# Patient Record
Sex: Male | Born: 1950 | Race: White | Hispanic: No | Smoking: Current every day smoker
Health system: Southern US, Community
[De-identification: ages and names within clinical notes are randomized; demographics above are authoritative.]

## PROBLEM LIST (undated history)

## (undated) DIAGNOSIS — N189 Chronic kidney disease, unspecified: Secondary | ICD-10-CM

## (undated) DIAGNOSIS — E119 Type 2 diabetes mellitus without complications: Secondary | ICD-10-CM

## (undated) DIAGNOSIS — S060XAA Concussion with loss of consciousness status unknown, initial encounter: Secondary | ICD-10-CM

## (undated) DIAGNOSIS — J9621 Acute and chronic respiratory failure with hypoxia: Secondary | ICD-10-CM

## (undated) DIAGNOSIS — S060X9A Concussion with loss of consciousness of unspecified duration, initial encounter: Secondary | ICD-10-CM

## (undated) DIAGNOSIS — I2510 Atherosclerotic heart disease of native coronary artery without angina pectoris: Secondary | ICD-10-CM

## (undated) DIAGNOSIS — N183 Chronic kidney disease, stage 3 unspecified: Secondary | ICD-10-CM

## (undated) DIAGNOSIS — I739 Peripheral vascular disease, unspecified: Secondary | ICD-10-CM

## (undated) DIAGNOSIS — R4182 Altered mental status, unspecified: Secondary | ICD-10-CM

## (undated) DIAGNOSIS — I629 Nontraumatic intracranial hemorrhage, unspecified: Secondary | ICD-10-CM

## (undated) DIAGNOSIS — I639 Cerebral infarction, unspecified: Secondary | ICD-10-CM

## (undated) DIAGNOSIS — I10 Essential (primary) hypertension: Secondary | ICD-10-CM

## (undated) HISTORY — PX: NECK SURGERY: SHX720

## (undated) HISTORY — DX: Peripheral vascular disease, unspecified: I73.9

## (undated) HISTORY — PX: VEIN BYPASS SURGERY: SHX833

---

## 2002-06-24 ENCOUNTER — Ambulatory Visit (HOSPITAL_COMMUNITY): Admission: RE | Admit: 2002-06-24 | Discharge: 2002-06-24 | Payer: Self-pay | Admitting: *Deleted

## 2002-06-24 ENCOUNTER — Encounter: Payer: Self-pay | Admitting: *Deleted

## 2002-07-16 ENCOUNTER — Ambulatory Visit (HOSPITAL_COMMUNITY): Admission: RE | Admit: 2002-07-16 | Discharge: 2002-07-16 | Payer: Self-pay | Admitting: *Deleted

## 2002-08-13 ENCOUNTER — Ambulatory Visit (HOSPITAL_COMMUNITY): Admission: RE | Admit: 2002-08-13 | Discharge: 2002-08-14 | Payer: Self-pay | Admitting: *Deleted

## 2007-06-28 ENCOUNTER — Encounter (INDEPENDENT_AMBULATORY_CARE_PROVIDER_SITE_OTHER): Payer: Self-pay | Admitting: Family Medicine

## 2007-06-28 ENCOUNTER — Ambulatory Visit: Admission: RE | Admit: 2007-06-28 | Discharge: 2007-06-28 | Payer: Self-pay | Admitting: Family Medicine

## 2007-06-28 ENCOUNTER — Ambulatory Visit: Payer: Self-pay | Admitting: Vascular Surgery

## 2012-11-19 ENCOUNTER — Other Ambulatory Visit: Payer: Self-pay | Admitting: Family Medicine

## 2012-11-19 DIAGNOSIS — R221 Localized swelling, mass and lump, neck: Secondary | ICD-10-CM

## 2012-12-02 ENCOUNTER — Ambulatory Visit
Admission: RE | Admit: 2012-12-02 | Discharge: 2012-12-02 | Disposition: A | Discharge status: Home / Self Care | Payer: BC Managed Care – PPO | Source: Ambulatory Visit | Attending: Family Medicine | Admitting: Family Medicine

## 2012-12-02 DIAGNOSIS — R221 Localized swelling, mass and lump, neck: Secondary | ICD-10-CM

## 2012-12-02 MED ORDER — IOHEXOL 300 MG/ML  SOLN
75.0000 mL | Freq: Once | INTRAMUSCULAR | Status: AC | PRN
  Administered 2012-12-02: 75 mL via INTRAVENOUS

## 2013-01-17 ENCOUNTER — Ambulatory Visit (HOSPITAL_COMMUNITY): Admission: RE | Admit: 2013-01-17 | Payer: BC Managed Care – PPO | Source: Ambulatory Visit | Admitting: Otolaryngology

## 2013-01-17 ENCOUNTER — Encounter (HOSPITAL_COMMUNITY): Admission: RE | Payer: Self-pay | Source: Ambulatory Visit

## 2013-01-17 SURGERY — EXCISION, PAROTID GLAND
Anesthesia: General | Laterality: Right

## 2014-10-10 ENCOUNTER — Emergency Department (HOSPITAL_COMMUNITY)
Admission: EM | Admit: 2014-10-10 | Discharge: 2014-10-10 | Disposition: A | Discharge status: Home / Self Care | Payer: No Typology Code available for payment source | Attending: Emergency Medicine | Admitting: Emergency Medicine

## 2014-10-10 ENCOUNTER — Encounter (HOSPITAL_COMMUNITY): Payer: Self-pay | Admitting: *Deleted

## 2014-10-10 DIAGNOSIS — I10 Essential (primary) hypertension: Secondary | ICD-10-CM | POA: Insufficient documentation

## 2014-10-10 DIAGNOSIS — I2510 Atherosclerotic heart disease of native coronary artery without angina pectoris: Secondary | ICD-10-CM | POA: Diagnosis not present

## 2014-10-10 DIAGNOSIS — Z720 Tobacco use: Secondary | ICD-10-CM | POA: Diagnosis not present

## 2014-10-10 DIAGNOSIS — Z7982 Long term (current) use of aspirin: Secondary | ICD-10-CM | POA: Insufficient documentation

## 2014-10-10 DIAGNOSIS — Z79899 Other long term (current) drug therapy: Secondary | ICD-10-CM | POA: Diagnosis not present

## 2014-10-10 DIAGNOSIS — E119 Type 2 diabetes mellitus without complications: Secondary | ICD-10-CM | POA: Diagnosis not present

## 2014-10-10 HISTORY — DX: Type 2 diabetes mellitus without complications: E11.9

## 2014-10-10 HISTORY — DX: Atherosclerotic heart disease of native coronary artery without angina pectoris: I25.10

## 2014-10-10 HISTORY — DX: Essential (primary) hypertension: I10

## 2014-10-10 LAB — URINALYSIS, ROUTINE W REFLEX MICROSCOPIC
BILIRUBIN URINE: NEGATIVE
Glucose, UA: NEGATIVE mg/dL
KETONES UR: NEGATIVE mg/dL
LEUKOCYTES UA: NEGATIVE
NITRITE: NEGATIVE
Protein, ur: NEGATIVE mg/dL
Specific Gravity, Urine: 1.014 (ref 1.005–1.030)
UROBILINOGEN UA: 0.2 mg/dL (ref 0.0–1.0)
pH: 7 (ref 5.0–8.0)

## 2014-10-10 LAB — BASIC METABOLIC PANEL
ANION GAP: 10 (ref 5–15)
BUN: 12 mg/dL (ref 6–20)
CALCIUM: 9.4 mg/dL (ref 8.9–10.3)
CHLORIDE: 101 mmol/L (ref 101–111)
CO2: 27 mmol/L (ref 22–32)
Creatinine, Ser: 1.23 mg/dL (ref 0.61–1.24)
GFR, EST NON AFRICAN AMERICAN: 59 mL/min — AB (ref >60)
Glucose, Bld: 119 mg/dL — ABNORMAL HIGH (ref 65–99)
POTASSIUM: 4.2 mmol/L (ref 3.5–5.1)
SODIUM: 138 mmol/L (ref 135–145)

## 2014-10-10 LAB — URINE MICROSCOPIC-ADD ON

## 2014-10-10 LAB — I-STAT TROPONIN, ED: TROPONIN I, POC: 0.01 ng/mL (ref 0.00–0.08)

## 2014-10-10 LAB — CBC
HCT: 44.8 % (ref 39.0–52.0)
Hemoglobin: 15.4 g/dL (ref 13.0–17.0)
MCH: 29.8 pg (ref 26.0–34.0)
MCHC: 34.4 g/dL (ref 30.0–36.0)
MCV: 86.7 fL (ref 78.0–100.0)
PLATELETS: 316 10*3/uL (ref 150–400)
RBC: 5.17 MIL/uL (ref 4.22–5.81)
RDW: 15.3 % (ref 11.5–15.5)
WBC: 8 10*3/uL (ref 4.0–10.5)

## 2014-10-10 MED ORDER — CLONIDINE HCL 0.1 MG PO TABS
0.1000 mg | ORAL_TABLET | Freq: Once | ORAL | Status: AC
  Administered 2014-10-10: 0.1 mg via ORAL
  Filled 2014-10-10: qty 1

## 2014-10-10 MED ORDER — CLONIDINE HCL 0.1 MG PO TABS: 0.1000 mg | ORAL_TABLET | Freq: Two times a day (BID) | ORAL | Status: DC

## 2014-10-10 MED ORDER — CLONIDINE HCL 0.2 MG PO TABS
0.2000 mg | ORAL_TABLET | Freq: Once | ORAL | Status: AC
  Administered 2014-10-10: 0.2 mg via ORAL
  Filled 2014-10-10: qty 1

## 2014-10-10 NOTE — Discharge Instructions (Signed)
DASH Eating Plan DASH stands for "Dietary Approaches to Stop Hypertension." The DASH eating plan is a healthy eating plan that has been shown to reduce high blood pressure (hypertension). Additional health benefits may include reducing the risk of type 2 diabetes mellitus, heart disease, and stroke. The DASH eating plan may also help with weight loss. WHAT DO I NEED TO KNOW ABOUT THE DASH EATING PLAN? For the DASH eating plan, you will follow these general guidelines:  Choose foods with a percent daily value for sodium of less than 5% (as listed on the food label).  Use salt-free seasonings or herbs instead of table salt or sea salt.  Check with your health care provider or pharmacist before using salt substitutes.  Eat lower-sodium products, often labeled as "lower sodium" or "no salt added."  Eat fresh foods.  Eat more vegetables, fruits, and low-fat dairy products.  Choose whole grains. Look for the word "whole" as the first word in the ingredient list.  Choose fish and skinless chicken or turkey more often than red meat. Limit fish, poultry, and meat to 6 oz (170 g) each day.  Limit sweets, desserts, sugars, and sugary drinks.  Choose heart-healthy fats.  Limit cheese to 1 oz (28 g) per day.  Eat more home-cooked food and less restaurant, buffet, and fast food.  Limit fried foods.  Cook foods using methods other than frying.  Limit canned vegetables. If you do use them, rinse them well to decrease the sodium.  When eating at a restaurant, ask that your food be prepared with less salt, or no salt if possible. WHAT FOODS CAN I EAT? Seek help from a dietitian for individual calorie needs. Grains Whole grain or whole wheat bread. Brown rice. Whole grain or whole wheat pasta. Quinoa, bulgur, and whole grain cereals. Low-sodium cereals. Corn or whole wheat flour tortillas. Whole grain cornbread. Whole grain crackers. Low-sodium crackers. Vegetables Fresh or frozen vegetables  (raw, steamed, roasted, or grilled). Low-sodium or reduced-sodium tomato and vegetable juices. Low-sodium or reduced-sodium tomato sauce and paste. Low-sodium or reduced-sodium canned vegetables.  Fruits All fresh, canned (in natural juice), or frozen fruits. Meat and Other Protein Products Ground beef (85% or leaner), grass-fed beef, or beef trimmed of fat. Skinless chicken or turkey. Ground chicken or turkey. Pork trimmed of fat. All fish and seafood. Eggs. Dried beans, peas, or lentils. Unsalted nuts and seeds. Unsalted canned beans. Dairy Low-fat dairy products, such as skim or 1% milk, 2% or reduced-fat cheeses, low-fat ricotta or cottage cheese, or plain low-fat yogurt. Low-sodium or reduced-sodium cheeses. Fats and Oils Tub margarines without trans fats. Light or reduced-fat mayonnaise and salad dressings (reduced sodium). Avocado. Safflower, olive, or canola oils. Natural peanut or almond butter. Other Unsalted popcorn and pretzels. The items listed above may not be a complete list of recommended foods or beverages. Contact your dietitian for more options. WHAT FOODS ARE NOT RECOMMENDED? Grains White bread. White pasta. White rice. Refined cornbread. Bagels and croissants. Crackers that contain trans fat. Vegetables Creamed or fried vegetables. Vegetables in a cheese sauce. Regular canned vegetables. Regular canned tomato sauce and paste. Regular tomato and vegetable juices. Fruits Dried fruits. Canned fruit in light or heavy syrup. Fruit juice. Meat and Other Protein Products Fatty cuts of meat. Ribs, chicken wings, bacon, sausage, bologna, salami, chitterlings, fatback, hot dogs, bratwurst, and packaged luncheon meats. Salted nuts and seeds. Canned beans with salt. Dairy Whole or 2% milk, cream, half-and-half, and cream cheese. Whole-fat or sweetened yogurt. Full-fat   cheeses or blue cheese. Nondairy creamers and whipped toppings. Processed cheese, cheese spreads, or cheese  curds. Condiments Onion and garlic salt, seasoned salt, table salt, and sea salt. Canned and packaged gravies. Worcestershire sauce. Tartar sauce. Barbecue sauce. Teriyaki sauce. Soy sauce, including reduced sodium. Steak sauce. Fish sauce. Oyster sauce. Cocktail sauce. Horseradish. Ketchup and mustard. Meat flavorings and tenderizers. Bouillon cubes. Hot sauce. Tabasco sauce. Marinades. Taco seasonings. Relishes. Fats and Oils Butter, stick margarine, lard, shortening, ghee, and bacon fat. Coconut, palm kernel, or palm oils. Regular salad dressings. Other Pickles and olives. Salted popcorn and pretzels. The items listed above may not be a complete list of foods and beverages to avoid. Contact your dietitian for more information. WHERE CAN I FIND MORE INFORMATION? National Heart, Lung, and [employer] Institute: www.nhlbi.nih.gov/health/health-topics/topics/dash/ Document Released: 02/02/2011 Document Revised: 06/30/2013 Document Reviewed: 12/18/2012 ExitCare Patient Information 2015 [employer]. This information is not intended to replace advice given to you by your health care provider. Make sure you discuss any questions you have with your health care provider. Hypertension Hypertension, commonly called high blood pressure, is when the force of blood pumping through your arteries is too strong. Your arteries are the blood vessels that carry blood from your heart throughout your body. A blood pressure reading consists of a higher number over a lower number, such as 110/72. The higher number (systolic) is the pressure inside your arteries when your heart pumps. The lower number (diastolic) is the pressure inside your arteries when your heart relaxes. Ideally you want your blood pressure below 120/80. Hypertension forces your heart to work harder to pump blood. Your arteries may become narrow or stiff. Having hypertension puts you at risk for heart disease, stroke, and other problems.  RISK  FACTORS Some risk factors for high blood pressure are controllable. Others are not.  Risk factors you cannot control include:   Race. You may be at higher risk if you are African American.  Age. Risk increases with age.  Gender. Men are at higher risk than women before age 45 years. After age 65, women are at higher risk than men. Risk factors you can control include:  Not getting enough exercise or physical activity.  Being overweight.  Getting too much fat, sugar, calories, or salt in your diet.  Drinking too much alcohol. SIGNS AND SYMPTOMS Hypertension does not usually cause signs or symptoms. Extremely high blood pressure (hypertensive crisis) may cause headache, anxiety, shortness of breath, and nosebleed. DIAGNOSIS  To check if you have hypertension, your health care provider will measure your blood pressure while you are seated, with your arm held at the level of your heart. It should be measured at least twice using the same arm. Certain conditions can cause a difference in blood pressure between your right and left arms. A blood pressure reading that is higher than normal on one occasion does not mean that you need treatment. If one blood pressure reading is high, ask your health care provider about having it checked again. TREATMENT  Treating high blood pressure includes making lifestyle changes and possibly taking medicine. Living a healthy lifestyle can help lower high blood pressure. You may need to change some of your habits. Lifestyle changes may include:  Following the DASH diet. This diet is high in fruits, vegetables, and whole grains. It is low in salt, red meat, and added sugars.  Getting at least 2 hours of brisk physical activity every week.  Losing weight if necessary.  Not smoking.  Limiting   alcoholic beverages.  Learning ways to reduce stress. If lifestyle changes are not enough to get your blood pressure under control, your health care provider may  prescribe medicine. You may need to take more than one. Work closely with your health care provider to understand the risks and benefits. HOME CARE INSTRUCTIONS  Have your blood pressure rechecked as directed by your health care provider.   Take medicines only as directed by your health care provider. Follow the directions carefully. Blood pressure medicines must be taken as prescribed. The medicine does not work as well when you skip doses. Skipping doses also puts you at risk for problems.   Do not smoke.   Monitor your blood pressure at home as directed by your health care provider. SEEK MEDICAL CARE IF:   You think you are having a reaction to medicines taken.  You have recurrent headaches or feel dizzy.  You have swelling in your ankles.  You have trouble with your vision. SEEK IMMEDIATE MEDICAL CARE IF:  You develop a severe headache or confusion.  You have unusual weakness, numbness, or feel faint.  You have severe chest or abdominal pain.  You vomit repeatedly.  You have trouble breathing. MAKE SURE YOU:   Understand these instructions.  Will watch your condition.  Will get help right away if you are not doing well or get worse. Document Released: 02/13/2005 Document Revised: 06/30/2013 Document Reviewed: 12/06/2012 ExitCare Patient Information 2015 [employer]. This information is not intended to replace advice given to you by your health care provider. Make sure you discuss any questions you have with your health care provider.  

## 2014-10-10 NOTE — ED Notes (Signed)
The pt is c/o being dizzy and his bp is high.  He is supposed to take bp meds but he only takes that  Sometimes.  He last took bp med  Monday and Tuesday.  C/o some neck pain

## 2014-10-10 NOTE — ED Provider Notes (Signed)
CSN: [id number]     Arrival date & time 10/10/14  1734 History   First MD Initiated Contact with Patient 10/10/14 1847     Chief Complaint  Patient presents with  . Hypertension      HPI The pt is c/o being dizzy and his bp is high. He is supposed to take bp meds but he only takes that Sometimes. He last took bp med Monday and Tuesday. C/o some neck pain Past Medical History  Diagnosis Date  . Coronary artery disease   . Diabetes mellitus without complication   . Hypertension    History reviewed. No pertinent past surgical history. No family history on file. Social History  Substance Use Topics  . Smoking status: Current Every Day Smoker  . Smokeless tobacco: None  . Alcohol Use: No    Review of Systems  All other systems reviewed and are negative  Allergies  Review of patient's allergies indicates no known allergies.  Home Medications   Prior to Admission medications   Medication Sig Start Date End Date Taking? Authorizing Provider  aspirin EC 81 MG tablet Take 81 mg by mouth daily.   Yes Historical Provider, MD  cyclobenzaprine (FLEXERIL) 10 MG tablet Take 10 mg by mouth 3 (three) times daily as needed for muscle spasms.   Yes Historical Provider, MD  ibuprofen (ADVIL,MOTRIN) 200 MG tablet Take 400 mg by mouth every 6 (six) hours as needed for moderate pain.    Yes Historical Provider, MD  valsartan (DIOVAN) 320 MG tablet Take 320 mg by mouth daily.   Yes Historical Provider, MD  cloNIDine (CATAPRES) 0.1 MG tablet Take 1 tablet (0.1 mg total) by mouth 2 (two) times daily. 10/10/14   Nelva Nay, MD   BP 249/110 mmHg  Pulse 54  Temp(Src) 97.5 F (36.4 C) (Oral)  Resp 17  Wt 141 lb (63.957 kg)  SpO2 98% Physical Exam Physical Exam  Nursing note and vitals reviewed. Constitutional: He is oriented to person, place, and time. He appears well-developed and well-nourished. No distress.  HENT:  Head: Normocephalic and atraumatic.  Eyes: Pupils are equal, round,  and reactive to light.  Neck: Normal range of motion.  Cardiovascular: Normal rate and intact distal pulses.   Pulmonary/Chest: No respiratory distress.  Abdominal: Normal appearance. He exhibits no distension.  Musculoskeletal: Normal range of motion.  Neurological: He is alert and oriented to person, place, and time. No cranial nerve deficit.  Skin: Skin is warm and dry. No rash noted.  Psychiatric: He has a normal mood and affect. His behavior is normal.   ED Course  Procedures (including critical care time) Medications  cloNIDine (CATAPRES) tablet 0.2 mg (0.2 mg Oral Given 10/10/14 1938)  cloNIDine (CATAPRES) tablet 0.1 mg (0.1 mg Oral Given 10/10/14 2046)    Labs Review Labs Reviewed  BASIC METABOLIC PANEL - Abnormal; Notable for the following:    Glucose, Bld 119 (*)    GFR calc non Af Amer 59 (*)    All other components within normal limits  URINALYSIS, ROUTINE W REFLEX MICROSCOPIC (NOT AT [facility]) - Abnormal; Notable for the following:    APPearance CLOUDY (*)    Hgb urine dipstick TRACE (*)    All other components within normal limits  CBC  URINE MICROSCOPIC-ADD ON  CBG MONITORING, ED  I-STAT TROPOININ, ED    Imaging Review No results found. I, Rielynn Trulson L, personally reviewed and evaluated these images and lab results as part of my medical decision-making.  EKG Interpretation   Date/Time:  Saturday October 10 2014 18:08:38 EDT Ventricular Rate:  53 PR Interval:  126 QRS Duration: 92 QT Interval:  472 QTC Calculation: 442 R Axis:   81 Text Interpretation:  Sinus bradycardia Biatrial enlargement ST \\T \ T wave  abnormality, consider inferior ischemia  vs LVH with strain Abnormal ECG  Confirmed by Fayrene Fearing  MD, MARK ([id number]) on 10/10/2014 6:11:16 PM     MAP at discharge was 106 After treatment in the ED the patient feels back to baseline and wants to go home. MDM   Final diagnoses:  Essential hypertension        Nelva Nay, MD 10/10/14 2141

## 2017-03-20 DIAGNOSIS — R7303 Prediabetes: Secondary | ICD-10-CM | POA: Diagnosis not present

## 2017-03-20 DIAGNOSIS — I714 Abdominal aortic aneurysm, without rupture: Secondary | ICD-10-CM | POA: Diagnosis not present

## 2017-03-20 DIAGNOSIS — N183 Chronic kidney disease, stage 3 (moderate): Secondary | ICD-10-CM | POA: Diagnosis not present

## 2017-03-20 DIAGNOSIS — I129 Hypertensive chronic kidney disease with stage 1 through stage 4 chronic kidney disease, or unspecified chronic kidney disease: Secondary | ICD-10-CM | POA: Diagnosis not present

## 2017-03-20 DIAGNOSIS — E785 Hyperlipidemia, unspecified: Secondary | ICD-10-CM | POA: Diagnosis not present

## 2017-03-20 DIAGNOSIS — R634 Abnormal weight loss: Secondary | ICD-10-CM | POA: Diagnosis not present

## 2017-03-20 DIAGNOSIS — I739 Peripheral vascular disease, unspecified: Secondary | ICD-10-CM | POA: Diagnosis not present

## 2017-03-20 DIAGNOSIS — R69 Illness, unspecified: Secondary | ICD-10-CM | POA: Diagnosis not present

## 2017-10-03 DIAGNOSIS — R7303 Prediabetes: Secondary | ICD-10-CM | POA: Diagnosis not present

## 2017-10-03 DIAGNOSIS — N183 Chronic kidney disease, stage 3 (moderate): Secondary | ICD-10-CM | POA: Diagnosis not present

## 2017-10-03 DIAGNOSIS — I714 Abdominal aortic aneurysm, without rupture: Secondary | ICD-10-CM | POA: Diagnosis not present

## 2017-10-03 DIAGNOSIS — R69 Illness, unspecified: Secondary | ICD-10-CM | POA: Diagnosis not present

## 2017-10-03 DIAGNOSIS — I129 Hypertensive chronic kidney disease with stage 1 through stage 4 chronic kidney disease, or unspecified chronic kidney disease: Secondary | ICD-10-CM | POA: Diagnosis not present

## 2017-10-03 DIAGNOSIS — I739 Peripheral vascular disease, unspecified: Secondary | ICD-10-CM | POA: Diagnosis not present

## 2017-10-03 DIAGNOSIS — Z125 Encounter for screening for malignant neoplasm of prostate: Secondary | ICD-10-CM | POA: Diagnosis not present

## 2017-10-09 DIAGNOSIS — I714 Abdominal aortic aneurysm, without rupture: Secondary | ICD-10-CM | POA: Diagnosis not present

## 2017-11-08 DIAGNOSIS — R69 Illness, unspecified: Secondary | ICD-10-CM | POA: Diagnosis not present

## 2017-11-15 ENCOUNTER — Other Ambulatory Visit: Payer: Self-pay

## 2017-11-15 DIAGNOSIS — I714 Abdominal aortic aneurysm, without rupture, unspecified: Secondary | ICD-10-CM

## 2017-11-26 ENCOUNTER — Other Ambulatory Visit: Payer: Self-pay

## 2017-11-26 ENCOUNTER — Encounter: Payer: Self-pay | Admitting: Surgery

## 2017-11-26 ENCOUNTER — Ambulatory Visit (INDEPENDENT_AMBULATORY_CARE_PROVIDER_SITE_OTHER): Payer: Medicare HMO | Admitting: Surgery

## 2017-11-26 VITALS — BP 139/74 | HR 68 | Temp 97.7°F | Resp 18 | Ht 67.5 in | Wt 131.0 lb

## 2017-11-26 DIAGNOSIS — I713 Abdominal aortic aneurysm, ruptured, unspecified: Secondary | ICD-10-CM

## 2017-11-26 DIAGNOSIS — I739 Peripheral vascular disease, unspecified: Secondary | ICD-10-CM

## 2017-11-26 DIAGNOSIS — I714 Abdominal aortic aneurysm, without rupture, unspecified: Secondary | ICD-10-CM

## 2017-11-26 DIAGNOSIS — I6529 Occlusion and stenosis of unspecified carotid artery: Secondary | ICD-10-CM

## 2017-11-26 NOTE — Progress Notes (Signed)
[facility]  Patient name: Mark [name] MRN: [id number] DOB: [birth year] Sex: male   REQUESTING PROVIDER:    Dr. Cliffton Asters   REASON FOR CONSULT:    AAA   HISTORY OF PRESENT ILLNESS:   Mark [name] is a 67 y.o. male, who is referred for evaluation of a abdominal aortic aneurysm.  He most recently had an ultrasound that revealed a 5.3 cm abdominal aneurysm.  The patient denies any abdominal pain or back pain  Patient has a history of peripheral vascular disease.  He states that he had 5 stents placed many many years ago.  He is medically managed for hypertension.  He has hypercholesterolemia but is intolerant of statins.  He is a current smoker, approximately 2 packs/day.  He states that he can walk up about 5 flights of steps before he gets short of breath.  He does walk approximately 1/4-1/3 of a mile without difficulty.  He does have a history of nonischemic cardiomyopathy, followed by Dr. Leeann Must.  He has a history of stroke in 2016.  His carotid arteries were normal at that time.  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  PAST MEDICAL HISTORY    Past Medical History:  Diagnosis Date  . Coronary artery disease   . Diabetes mellitus without complication (HCC)   . Hypertension   . PVD (peripheral vascular disease) (HCC)      FAMILY HISTORY   Family History  Problem Relation Age of Onset  . Dementia Mother   . Diabetes Mother   . Heart disease Mother   . Diabetes Father   . Heart disease Father     SOCIAL HISTORY:   Social History   Socioeconomic History  . Marital status: Married    Spouse name: Not on file  . Number of children: Not on file  . Years of education: Not on file  . Highest education level: Not on file  Occupational History  . Not on file  Social Needs  . Financial resource strain: Not on file  . Food insecurity:    Worry: Not on file    Inability: Not  on file  . Transportation needs:    Medical: Not on file    Non-medical: Not on file  Tobacco Use  . Smoking status: Current Every Day Smoker  . Smokeless tobacco: Never Used  Substance and Sexual Activity  . Alcohol use: No  . Drug use: Not on file  . Sexual activity: Not on file  Lifestyle  . Physical activity:    Days per week: Not on file    Minutes per session: Not on file  . Stress: Not on file  Relationships  . Social connections:    Talks on phone: Not on file    Gets together: Not on file    Attends religious service: Not on file    Active member of club or organization: Not on file    Attends meetings of clubs or organizations: Not on file    Relationship status: Not on file  . Intimate partner violence:    Fear of current or ex partner: Not on file    Emotionally abused: Not on file    Physically abused: Not on file    Forced sexual activity: Not on file  Other Topics Concern  . Not on file  Social History Narrative  . Not on file    ALLERGIES:    Allergies  Allergen Reactions  .  Pravastatin Other (See Comments)    Pain & weakness  . Orajel Mouth-Aid [Carbamide Peroxide]   . Rosuvastatin   . Sudafed [Pseudoephedrine]     CURRENT MEDICATIONS:    Current Outpatient Medications  Medication Sig Dispense Refill  . amLODipine (NORVASC) 10 MG tablet Take 10 mg by mouth daily.    Marland Kitchen aspirin 81 MG chewable tablet Chew by mouth.    Marland Kitchen aspirin EC 81 MG tablet Take 81 mg by mouth daily.    . hydrALAZINE (APRESOLINE) 25 MG tablet Take 25 mg by mouth 3 (three) times daily.    Marland Kitchen lisinopril (PRINIVIL,ZESTRIL) 40 MG tablet Take 40 mg by mouth daily.    . cloNIDine (CATAPRES) 0.1 MG tablet Take 1 tablet (0.1 mg total) by mouth 2 (two) times daily. 60 tablet 11  . cyclobenzaprine (FLEXERIL) 10 MG tablet Take 10 mg by mouth 3 (three) times daily as needed for muscle spasms.    Marland Kitchen ibuprofen (ADVIL,MOTRIN) 200 MG tablet Take 400 mg by mouth every 6 (six) hours as needed  for moderate pain.     . valsartan (DIOVAN) 320 MG tablet Take 320 mg by mouth daily.     No current facility-administered medications for this visit.     REVIEW OF SYSTEMS:   [X]  denotes positive finding, [ ]  denotes negative finding Cardiac  Comments:  Chest pain or chest pressure:    Shortness of breath upon exertion:    Short of breath when lying flat:    Irregular heart rhythm:        Vascular    Pain in calf, thigh, or hip brought on by ambulation: x   Pain in feet at night that wakes you up from your sleep:  x   Blood clot in your veins:    Leg swelling:         Pulmonary    Oxygen at home:    Productive cough:     Wheezing:         Neurologic    Sudden weakness in arms or legs:     Sudden numbness in arms or legs:     Sudden onset of difficulty speaking or slurred speech:    Temporary loss of vision in one eye:     Problems with dizziness:         Gastrointestinal    Blood in stool:      Vomited blood:         Genitourinary    Burning when urinating:     Blood in urine:        Psychiatric    Major depression:         Hematologic    Bleeding problems:    Problems with blood clotting too easily:        Skin    Rashes or ulcers:        Constitutional    Fever or chills:     PHYSICAL EXAM:   Vitals:   11/26/17 0905  BP: 139/74  Pulse: 68  Resp: 18  Temp: 97.7 F (36.5 C)  TempSrc: Oral  SpO2: 100%  Weight: 131 lb (59.4 kg)  Height: 5' 7.5" (1.715 m)    GENERAL: The patient is a well-nourished male, in no acute distress. The vital signs are documented above. CARDIAC: There is a regular rate and rhythm.  VASCULAR: Nonpalpable pedal pulses.  Palpable femoral pulses.  No carotid bruits. PULMONARY: Nonlabored respirations ABDOMEN: Soft and non-tender with normal pitched bowel sounds.  Pulsatile abdominal mass which is not tender. MUSCULOSKELETAL: There are no major deformities or cyanosis. NEUROLOGIC: No focal weakness or paresthesias are  detected. SKIN: There are no ulcers or rashes noted. PSYCHIATRIC: The patient has a normal affect.  STUDIES:   Abdominal ultrasound: I have reviewed this which shows a 5.3 cm infrarenal abdominal aortic aneurysm.  ASSESSMENT and PLAN   AAA: I discussed the natural history of aneurysmal degeneration.  By ultrasound, he would meet size criteria to proceed with repair.  I told him that I need to get a CT angiogram to define his anatomy and confirm the diameter.  I will scan him from the chest abdomen and pelvis with fine cuts in case he needs to be considered for fenestrated repair.  With his history of peripheral vascular disease and stenting in the past, I will continue his CT angiogram with runoff.  He will also get preoperative carotid Doppler studies and ABIs.  He will follow-up with me in approximately 2 weeks once these studies have been completed.   Durene Cal, MD [facility] [phone] Pager [phone]

## 2017-12-12 DIAGNOSIS — I714 Abdominal aortic aneurysm, without rupture: Secondary | ICD-10-CM | POA: Diagnosis not present

## 2017-12-12 DIAGNOSIS — I10 Essential (primary) hypertension: Secondary | ICD-10-CM | POA: Diagnosis not present

## 2017-12-12 DIAGNOSIS — I739 Peripheral vascular disease, unspecified: Secondary | ICD-10-CM | POA: Diagnosis not present

## 2017-12-13 ENCOUNTER — Inpatient Hospital Stay: Admission: RE | Admit: 2017-12-13 | Payer: Medicare HMO | Source: Ambulatory Visit

## 2017-12-13 ENCOUNTER — Other Ambulatory Visit: Payer: Medicare HMO

## 2017-12-17 ENCOUNTER — Ambulatory Visit (HOSPITAL_COMMUNITY)
Admission: RE | Admit: 2017-12-17 | Discharge: 2017-12-17 | Disposition: A | Discharge status: Home / Self Care | Payer: Medicare HMO | Source: Ambulatory Visit | Attending: Surgery | Admitting: Surgery

## 2017-12-17 ENCOUNTER — Ambulatory Visit (INDEPENDENT_AMBULATORY_CARE_PROVIDER_SITE_OTHER)
Admission: RE | Admit: 2017-12-17 | Discharge: 2017-12-17 | Disposition: A | Discharge status: Home / Self Care | Payer: Medicare HMO | Source: Ambulatory Visit | Attending: Surgery | Admitting: Surgery

## 2017-12-17 DIAGNOSIS — I739 Peripheral vascular disease, unspecified: Secondary | ICD-10-CM | POA: Insufficient documentation

## 2017-12-17 DIAGNOSIS — I6529 Occlusion and stenosis of unspecified carotid artery: Secondary | ICD-10-CM | POA: Diagnosis present

## 2017-12-18 ENCOUNTER — Other Ambulatory Visit: Payer: Self-pay

## 2017-12-18 DIAGNOSIS — I714 Abdominal aortic aneurysm, without rupture, unspecified: Secondary | ICD-10-CM

## 2017-12-18 DIAGNOSIS — I739 Peripheral vascular disease, unspecified: Secondary | ICD-10-CM

## 2017-12-20 ENCOUNTER — Ambulatory Visit (HOSPITAL_COMMUNITY)
Admission: RE | Admit: 2017-12-20 | Discharge: 2017-12-20 | Disposition: A | Discharge status: Home / Self Care | Payer: Medicare HMO | Source: Ambulatory Visit | Attending: Surgery | Admitting: Surgery

## 2017-12-20 ENCOUNTER — Ambulatory Visit
Admission: RE | Admit: 2017-12-20 | Discharge: 2017-12-20 | Disposition: A | Discharge status: Home / Self Care | Payer: Medicare HMO | Source: Ambulatory Visit | Attending: Surgery | Admitting: Surgery

## 2017-12-20 ENCOUNTER — Other Ambulatory Visit: Payer: Medicare HMO

## 2017-12-20 DIAGNOSIS — I714 Abdominal aortic aneurysm, without rupture, unspecified: Secondary | ICD-10-CM

## 2017-12-20 DIAGNOSIS — I713 Abdominal aortic aneurysm, ruptured, unspecified: Secondary | ICD-10-CM

## 2017-12-20 DIAGNOSIS — I739 Peripheral vascular disease, unspecified: Secondary | ICD-10-CM | POA: Insufficient documentation

## 2017-12-20 MED ORDER — IOPAMIDOL (ISOVUE-370) INJECTION 76%
125.0000 mL | Freq: Once | INTRAVENOUS | Status: AC | PRN
  Administered 2017-12-20: 125 mL via INTRAVENOUS

## 2017-12-24 ENCOUNTER — Encounter: Payer: Self-pay | Admitting: *Deleted

## 2017-12-24 ENCOUNTER — Other Ambulatory Visit: Payer: Self-pay

## 2017-12-24 ENCOUNTER — Ambulatory Visit: Payer: Medicare HMO | Admitting: Surgery

## 2017-12-24 ENCOUNTER — Other Ambulatory Visit: Payer: Self-pay | Admitting: *Deleted

## 2017-12-24 ENCOUNTER — Encounter: Payer: Self-pay | Admitting: Surgery

## 2017-12-24 VITALS — BP 157/77 | HR 54 | Resp 18 | Ht 67.5 in | Wt 135.1 lb

## 2017-12-24 DIAGNOSIS — I70213 Atherosclerosis of native arteries of extremities with intermittent claudication, bilateral legs: Secondary | ICD-10-CM | POA: Diagnosis not present

## 2017-12-24 DIAGNOSIS — I714 Abdominal aortic aneurysm, without rupture, unspecified: Secondary | ICD-10-CM

## 2017-12-24 NOTE — Progress Notes (Signed)
[facility]  Patient name: Mark [name] MRN: [id number] DOB: 09-10-1950 Sex: male   REASON FOR VISIT:    Follow up  HISOTRY OF PRESENT ILLNESS:    Cherie Khera IV is a 67 y.o. male who returns with a CTA for follow up of his AAA.  Patient has a history of peripheral vascular disease.  He states that he had 5 stents placed many many years ago.  He is medically managed for hypertension.  He has hypercholesterolemia but is intolerant of statins.  He is a current smoker, approximately 2 packs/day.  He states that he can walk up about 5 flights of steps before he gets short of breath.  He does walk approximately 1/4-1/3 of a mile without difficulty.  He does have a history of nonischemic cardiomyopathy, followed by Dr. Leeann Must.  He has a history of stroke in 2016.  His carotid arteries were normal at that time.  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   PAST MEDICAL HISTORY:   Past Medical History:  Diagnosis Date  . Coronary artery disease   . Diabetes mellitus without complication (HCC)   . Hypertension   . PVD (peripheral vascular disease) (HCC)      FAMILY HISTORY:   Family History  Problem Relation Age of Onset  . Dementia Mother   . Diabetes Mother   . Heart disease Mother   . Diabetes Father   . Heart disease Father     SOCIAL HISTORY:   Social History   Tobacco Use  . Smoking status: Current Every Day Smoker  . Smokeless tobacco: Never Used  Substance Use Topics  . Alcohol use: No     ALLERGIES:   Allergies  Allergen Reactions  . Pravastatin Other (See Comments)    Pain & weakness  . Orajel Mouth-Aid [Carbamide Peroxide]   . Rosuvastatin   . Sudafed [Pseudoephedrine]      CURRENT MEDICATIONS:   Current Outpatient Medications  Medication Sig Dispense Refill  . amLODipine (NORVASC) 10 MG tablet Take 10 mg by mouth daily.    Marland Kitchen aspirin 81 MG  chewable tablet Chew by mouth.    Marland Kitchen aspirin EC 81 MG tablet Take 81 mg by mouth daily.    . cloNIDine (CATAPRES) 0.1 MG tablet Take 1 tablet (0.1 mg total) by mouth 2 (two) times daily. 60 tablet 11  . cyclobenzaprine (FLEXERIL) 10 MG tablet Take 10 mg by mouth 3 (three) times daily as needed for muscle spasms.    . hydrALAZINE (APRESOLINE) 25 MG tablet Take 25 mg by mouth 3 (three) times daily.    Marland Kitchen ibuprofen (ADVIL,MOTRIN) 200 MG tablet Take 400 mg by mouth every 6 (six) hours as needed for moderate pain.     Marland Kitchen lisinopril (PRINIVIL,ZESTRIL) 40 MG tablet Take 40 mg by mouth daily.    . valsartan (DIOVAN) 320 MG tablet Take 320 mg by mouth daily.     No current facility-administered medications for this visit.     REVIEW OF SYSTEMS:   [X]  denotes positive finding, [ ]  denotes negative finding Cardiac  Comments:  Chest pain or chest pressure:    Shortness of breath upon exertion:    Short of breath when lying flat:    Irregular heart rhythm:        Vascular    Pain in calf, thigh, or hip brought on by ambulation: x   Pain in feet at night that wakes  you up from your sleep:  x   Blood clot in your veins:    Leg swelling:         Pulmonary    Oxygen at home:    Productive cough:     Wheezing:         Neurologic    Sudden weakness in arms or legs:     Sudden numbness in arms or legs:     Sudden onset of difficulty speaking or slurred speech:    Temporary loss of vision in one eye:     Problems with dizziness:         Gastrointestinal    Blood in stool:     Vomited blood:         Genitourinary    Burning when urinating:     Blood in urine:        Psychiatric    Major depression:         Hematologic    Bleeding problems:    Problems with blood clotting too easily:        Skin    Rashes or ulcers:        Constitutional    Fever or chills:      PHYSICAL EXAM:   There were no vitals filed for this visit.  GENERAL: The patient is a well-nourished male, in no  acute distress. The vital signs are documented above. CARDIAC: There is a regular rate and rhythm.  PULMONARY: Non-labored respirations ABDOMEN: Soft and non-tender with normal pitched bowel sounds.  MUSCULOSKELETAL: There are no major deformities or cyanosis. NEUROLOGIC: No focal weakness or paresthesias are detected. SKIN: There are no ulcers or rashes noted. PSYCHIATRIC: The patient has a normal affect.  STUDIES:   I have ordered and reviewed the following studies:  Lower extremity duplex: Right: 50-74% stenosis noted in the mid SFA. Note: Native artery stenosis criteria was used. Unable to identify stents.  Left: 50-74% stenosis noted in the mid-dist SFA. Note: Native artery stenosis criteria was used. Unable to identify stents.  ABI: ABI/TBIToday's ABIToday's TBIPrevious ABIPrevious TBI +-------+-----------+-----------+------------+------------+ Right 0.92    0.62                 +-------+-----------+-----------+------------+------------+ Left  0.88    0.49                 +-------+-----------+-----------+------------+------------+  Carotid: Right Carotid: Velocities in the right ICA are consistent with a 1-39% stenosis.  Left Carotid: Velocities in the left ICA are consistent with a 1-39% stenosis.  Vertebrals: Bilateral vertebral arteries demonstrate antegrade flow. Subclavians: Normal flow hemodynamics were seen in bilateral subclavian       arteries.  CTA Chest: :1. Aortic atherosclerosis without evidence of aneurysmal disease of the thoracic aorta. 2. Coronary atherosclerosis with mild amount of calcified plaque at the level of the distal left main and LAD. 3. Advanced diffuse centrilobular emphysema without evidence of pulmonary nodule or acute process  CTA A/P: 1. Infrarenal abdominal aortic aneurysm with greatest transverse dimensions of 4.8 x 5.5 cm. There is an infrarenal neck  present measuring 2.1 x 2.2 cm. 2. Both kidneys are supplied by paired renal arteries emanating in close proximity off of the abdominal aorta. Small accessory lower pole renal arteries emanate immediately inferior to the dominant bilateral renal arteries. 3. 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 4. Right lower extremity demonstrates mild aneurysmal disease of the proximal and distal right common iliac artery and significant focal stenosis of the mid right common  iliac artery of approximately 75-80% narrowing. Stented right external iliac artery shows no significant recurrent stenosis. There is a 50-60% stenosis of the right SFA at the juncture of middle and distal thirds. Continuous peroneal and posterior tibial runoff on the right with reconstituted distal anterior tibial artery. 5. Left lower extremity demonstrates stented left external iliac artery with no significant recurrent stenosis. There is a focal 60-75% stenosis of the distal left SFA at the adductor hiatus. 40-50% tibioperoneal trunk stenosis. Three-vessel runoff in the left lower leg.    MEDICAL ISSUES:   AAA: After reviewing the CT scan, I feel the patient is a candidate for endovascular repair.  He does have a stenosis within the right common iliac artery that will be able to be addressed with stent graft repair.  I discussed the details of the procedure including the risks and benefits which include but are not limited to bleeding, renal insufficiency, intestinal ischemia, lower extremity ischemia.  All of his questions have been answered.  His operation will be scheduled within the next few weeks  Claudication: The patient CT scan shows bilateral superficial femoral artery stenosis as well as a right common iliac lesion.  I would like to see how his symptoms improve after endovascular repair which will address his iliac lesion.  Because he is still smoking and is tolerating his symptoms, I would like to defer any intervention on the  superficial femoral arteries as long as his symptoms are tolerable.  Smoking: We discussed the importance of smoking cessation moving forward.  I pointed out that he does have emphysema on his CT scan.  Hopefully this will be a opportunity for him to stop.  He appeared to be somewhat open to stopping.    Durene Cal, MD [facility] [phone] Pager [phone]

## 2018-01-08 NOTE — Pre-Procedure Instructions (Signed)
Mark [name]  01/08/2018      [id number] [place], [place] - [phone] [place] [address] Phone: [phone] Fax: [phone]    Your procedure is scheduled on Nov. 22  Report to [facility] Admitting at 5:30 A.M.  Call this number if you have problems the morning of surgery:  [phone]   Remember:  Do not eat or drink after midnight.      Take these medicines the morning of surgery with A SIP OF WATER :              Amlodipine (norvasc)             Cyclobenzaprine (flexeril) if needed            Hydralazine (apresoline)                         7 days prior to surgery STOP taking Aleve, Naproxen, Ibuprofen, Motrin, Advil, Goody's, BC's, all herbal medications, fish oil, and all vitamins                 How to Manage Your Diabetes Before and After Surgery  Why is it important to control my blood sugar before and after surgery? . Improving blood sugar levels before and after surgery helps healing and can limit problems. . A way of improving blood sugar control is eating a healthy diet by: o  Eating less sugar and carbohydrates o  Increasing activity/exercise o  Talking with your doctor about reaching your blood sugar goals . High blood sugars (greater than 180 mg/dL) can raise your risk of infections and slow your recovery, so you will need to focus on controlling your diabetes during the weeks before surgery. . Make sure that the doctor who takes care of your diabetes knows about your planned surgery including the date and location.  How do I manage my blood sugar before surgery? . Check your blood sugar at least 4 times a day, starting 2 days before surgery, to make sure that the level is not too high or low. o Check your blood sugar the morning of your surgery when you wake up and every 2 hours until you get to the Short Stay unit. . If your blood sugar is less than 70 mg/dL, you will need to treat for low blood  sugar: o Do not take insulin. o Treat a low blood sugar (less than 70 mg/dL) with  cup of clear juice (cranberry or apple), 4 glucose tablets, OR glucose gel. Recheck blood sugar in 15 minutes after treatment (to make sure it is greater than 70 mg/dL). If your blood sugar is not greater than 70 mg/dL on recheck, call [phone] o  for further instructions. . Report your blood sugar to the short stay nurse when you get to Short Stay.  . If you are admitted to the hospital after surgery: o Your blood sugar will be checked by the staff and you will probably be given insulin after surgery (instead of oral diabetes medicines) to make sure you have good blood sugar levels. o The goal for blood sugar control after surgery is 80-180 mg/dL.      Do not wear jewelry.  Do not wear lotions, powders, or perfumes, or deodorant.  Do not shave 48 hours prior to surgery.  Men may shave face and neck.  Do not bring valuables to the hospital.  [facility] is  not responsible for any belongings or valuables.  Contacts, dentures or bridgework may not be worn into surgery.  Leave your suitcase in the car.  After surgery it may be brought to your room.  For patients admitted to the hospital, discharge time will be determined by your treatment team.  Patients discharged the day of surgery will not be allowed to drive home.    Special instructions:  [facility]- Preparing For Surgery  Before surgery, you can play an important role. Because skin is not sterile, your skin needs to be as free of germs as possible. You can reduce the number of germs on your skin by washing with CHG (chlorahexidine gluconate) Soap before surgery.  CHG is an antiseptic cleaner which kills germs and bonds with the skin to continue killing germs even after washing.    Oral Hygiene is also important to reduce your risk of infection.  Remember - BRUSH YOUR TEETH THE MORNING OF SURGERY WITH YOUR REGULAR TOOTHPASTE  Please do not use  if you have an allergy to CHG or antibacterial soaps. If your skin becomes reddened/irritated stop using the CHG.  Do not shave (including legs and underarms) for at least 48 hours prior to first CHG shower. It is OK to shave your face.  Please follow these instructions carefully.   1. Shower the NIGHT BEFORE SURGERY and the MORNING OF SURGERY with CHG.   2. If you chose to wash your hair, wash your hair first as usual with your normal shampoo.  3. After you shampoo, rinse your hair and body thoroughly to remove the shampoo.  4. Use CHG as you would any other liquid soap. You can apply CHG directly to the skin and wash gently with a scrungie or a clean washcloth.   5. Apply the CHG Soap to your body ONLY FROM THE NECK DOWN.  Do not use on open wounds or open sores. Avoid contact with your eyes, ears, mouth and genitals (private parts). Wash Face and genitals (private parts)  with your normal soap.  6. Wash thoroughly, paying special attention to the area where your surgery will be performed.  7. Thoroughly rinse your body with warm water from the neck down.  8. DO NOT shower/wash with your normal soap after using and rinsing off the CHG Soap.  9. Pat yourself dry with a CLEAN TOWEL.  10. Wear CLEAN PAJAMAS to bed the night before surgery, wear comfortable clothes the morning of surgery  11. Place CLEAN SHEETS on your bed the night of your first shower and DO NOT SLEEP WITH PETS.    Day of Surgery:  Do not apply any deodorants/lotions.  Please wear clean clothes to the hospital/surgery center.   Remember to brush your teeth WITH YOUR REGULAR TOOTHPASTE.    Please read over the following fact sheets that you were given. Coughing and Deep Breathing, MRSA Information and Surgical Site Infection Prevention

## 2018-01-09 ENCOUNTER — Encounter (HOSPITAL_COMMUNITY)
Admission: RE | Admit: 2018-01-09 | Discharge: 2018-01-09 | Disposition: A | Discharge status: Home / Self Care | Payer: Medicare HMO | Source: Ambulatory Visit | Attending: Surgery | Admitting: Surgery

## 2018-01-09 ENCOUNTER — Telehealth: Payer: Self-pay | Admitting: *Deleted

## 2018-01-09 NOTE — Telephone Encounter (Signed)
Call to patient and discussed rescheduling of EVAR surgery due to pending stress test scheduled for 01/17/18. and cardiac clearance. Instructed to be at [facility] admitting at 8 am on 02/01/2018.Pending Cardiac Clearance from Dr. Leeann Must.  NPO past MN night prior and to follow the detailed instructions received from pre-admission department. Mark [name] confirmed new surgery date.

## 2018-01-15 ENCOUNTER — Encounter: Payer: No Typology Code available for payment source | Admitting: Vascular Surgery

## 2018-01-17 DIAGNOSIS — I493 Ventricular premature depolarization: Secondary | ICD-10-CM | POA: Diagnosis not present

## 2018-01-17 DIAGNOSIS — I428 Other cardiomyopathies: Secondary | ICD-10-CM | POA: Diagnosis not present

## 2018-01-17 DIAGNOSIS — Z01810 Encounter for preprocedural cardiovascular examination: Secondary | ICD-10-CM | POA: Diagnosis not present

## 2018-01-17 DIAGNOSIS — Z8673 Personal history of transient ischemic attack (TIA), and cerebral infarction without residual deficits: Secondary | ICD-10-CM | POA: Diagnosis not present

## 2018-01-17 DIAGNOSIS — R55 Syncope and collapse: Secondary | ICD-10-CM | POA: Diagnosis not present

## 2018-01-28 ENCOUNTER — Encounter: Payer: Self-pay | Admitting: Surgery

## 2018-01-29 ENCOUNTER — Encounter: Payer: No Typology Code available for payment source | Admitting: Vascular Surgery

## 2018-01-29 ENCOUNTER — Other Ambulatory Visit (HOSPITAL_COMMUNITY): Payer: No Typology Code available for payment source

## 2018-01-31 ENCOUNTER — Encounter (HOSPITAL_COMMUNITY): Payer: Self-pay | Admitting: Urology

## 2018-01-31 ENCOUNTER — Other Ambulatory Visit: Payer: Self-pay

## 2018-01-31 NOTE — Anesthesia Preprocedure Evaluation (Addendum)
Anesthesia Evaluation  Patient identified by MRN, date of birth, ID band Patient awake    Reviewed: Allergy & Precautions, NPO status , Patient's Chart, lab work & pertinent test results  History of Anesthesia Complications Negative for: history of anesthetic complications  Airway Mallampati: II  TM Distance: >3 FB Neck ROM: Full    Dental no notable dental hx. (+) Dental Advisory Given   Pulmonary neg pulmonary ROS, Current Smoker,    Pulmonary exam normal        Cardiovascular hypertension, Pt. on medications + Peripheral Vascular Disease  Normal cardiovascular exam     Neuro/Psych CVA, No Residual Symptoms negative psych ROS   GI/Hepatic negative GI ROS, Neg liver ROS,   Endo/Other  negative endocrine ROSdiabetes  Renal/GU Renal InsufficiencyRenal disease  negative genitourinary   Musculoskeletal negative musculoskeletal ROS (+)   Abdominal   Peds negative pediatric ROS (+)  Hematology negative hematology ROS (+)   Anesthesia Other Findings   Reproductive/Obstetrics negative OB ROS                            Anesthesia Physical Anesthesia Plan  ASA: III  Anesthesia Plan: General   Post-op Pain Management:    Induction: Intravenous  PONV Risk Score and Plan: 2 and Ondansetron and Dexamethasone  Airway Management Planned: Oral ETT  Additional Equipment: Arterial line  Intra-op Plan:   Post-operative Plan:   Informed Consent: I have reviewed the patients History and Physical, chart, labs and discussed the procedure including the risks, benefits and alternatives for the proposed anesthesia with the patient or authorized representative who has indicated his/her understanding and acceptance.   Dental advisory given  Plan Discussed with: Anesthesiologist and CRNA  Anesthesia Plan Comments: (Cardiac clearance by Dr. Leeann Must. Lexiscan stress test 01/17/2018 showed no  scintigraphic evidence of inducible myocardial ischemia. Ejection fraction 50%.  Global left ventricular systolic function is normal.  Low risk scan.)       Anesthesia Quick Evaluation

## 2018-01-31 NOTE — Progress Notes (Signed)
Spoke with patient regarding pre-op instructions. Pt denies chest pain or acute illness. Pt states he has no problems with his heart and his recent stress test was normal. Requested stress test from Dr. Gevena Barre office.

## 2018-02-01 ENCOUNTER — Inpatient Hospital Stay (HOSPITAL_COMMUNITY): Payer: Medicare HMO | Admitting: Physician Assistant

## 2018-02-01 ENCOUNTER — Inpatient Hospital Stay (HOSPITAL_COMMUNITY): Payer: Medicare HMO

## 2018-02-01 ENCOUNTER — Inpatient Hospital Stay (HOSPITAL_COMMUNITY)
Admission: RE | Admit: 2018-02-01 | Discharge: 2018-02-02 | DRG: 268 | Disposition: A | Discharge status: Home / Self Care | Payer: Medicare HMO | Attending: Surgery | Admitting: Surgery

## 2018-02-01 ENCOUNTER — Encounter (HOSPITAL_COMMUNITY)
Admission: RE | Disposition: A | Discharge status: Home / Self Care | Payer: Self-pay | Source: Home / Self Care | Attending: Surgery

## 2018-02-01 ENCOUNTER — Encounter (HOSPITAL_COMMUNITY): Payer: Self-pay

## 2018-02-01 DIAGNOSIS — Z8679 Personal history of other diseases of the circulatory system: Secondary | ICD-10-CM

## 2018-02-01 DIAGNOSIS — E7800 Pure hypercholesterolemia, unspecified: Secondary | ICD-10-CM | POA: Diagnosis not present

## 2018-02-01 DIAGNOSIS — Z833 Family history of diabetes mellitus: Secondary | ICD-10-CM

## 2018-02-01 DIAGNOSIS — Z888 Allergy status to other drugs, medicaments and biological substances status: Secondary | ICD-10-CM | POA: Diagnosis not present

## 2018-02-01 DIAGNOSIS — N183 Chronic kidney disease, stage 3 (moderate): Secondary | ICD-10-CM | POA: Diagnosis not present

## 2018-02-01 DIAGNOSIS — Z7982 Long term (current) use of aspirin: Secondary | ICD-10-CM | POA: Diagnosis not present

## 2018-02-01 DIAGNOSIS — J439 Emphysema, unspecified: Secondary | ICD-10-CM | POA: Diagnosis not present

## 2018-02-01 DIAGNOSIS — Z95820 Peripheral vascular angioplasty status with implants and grafts: Secondary | ICD-10-CM

## 2018-02-01 DIAGNOSIS — I129 Hypertensive chronic kidney disease with stage 1 through stage 4 chronic kidney disease, or unspecified chronic kidney disease: Secondary | ICD-10-CM | POA: Diagnosis present

## 2018-02-01 DIAGNOSIS — E1151 Type 2 diabetes mellitus with diabetic peripheral angiopathy without gangrene: Secondary | ICD-10-CM | POA: Diagnosis present

## 2018-02-01 DIAGNOSIS — Q282 Arteriovenous malformation of cerebral vessels: Secondary | ICD-10-CM

## 2018-02-01 DIAGNOSIS — I708 Atherosclerosis of other arteries: Secondary | ICD-10-CM | POA: Diagnosis present

## 2018-02-01 DIAGNOSIS — I2510 Atherosclerotic heart disease of native coronary artery without angina pectoris: Secondary | ICD-10-CM | POA: Diagnosis not present

## 2018-02-01 DIAGNOSIS — I428 Other cardiomyopathies: Secondary | ICD-10-CM | POA: Diagnosis present

## 2018-02-01 DIAGNOSIS — I714 Abdominal aortic aneurysm, without rupture: Principal | ICD-10-CM | POA: Diagnosis present

## 2018-02-01 DIAGNOSIS — Z8673 Personal history of transient ischemic attack (TIA), and cerebral infarction without residual deficits: Secondary | ICD-10-CM | POA: Diagnosis not present

## 2018-02-01 DIAGNOSIS — Z9889 Other specified postprocedural states: Secondary | ICD-10-CM

## 2018-02-01 DIAGNOSIS — Z79899 Other long term (current) drug therapy: Secondary | ICD-10-CM

## 2018-02-01 DIAGNOSIS — F17210 Nicotine dependence, cigarettes, uncomplicated: Secondary | ICD-10-CM | POA: Diagnosis present

## 2018-02-01 DIAGNOSIS — E1122 Type 2 diabetes mellitus with diabetic chronic kidney disease: Secondary | ICD-10-CM | POA: Diagnosis not present

## 2018-02-01 DIAGNOSIS — I70203 Unspecified atherosclerosis of native arteries of extremities, bilateral legs: Secondary | ICD-10-CM | POA: Diagnosis present

## 2018-02-01 DIAGNOSIS — Z8249 Family history of ischemic heart disease and other diseases of the circulatory system: Secondary | ICD-10-CM | POA: Diagnosis not present

## 2018-02-01 DIAGNOSIS — Z791 Long term (current) use of non-steroidal anti-inflammatories (NSAID): Secondary | ICD-10-CM | POA: Diagnosis not present

## 2018-02-01 HISTORY — DX: Cerebral infarction, unspecified: I63.9

## 2018-02-01 HISTORY — DX: Concussion with loss of consciousness status unknown, initial encounter: S06.0XAA

## 2018-02-01 HISTORY — DX: Concussion with loss of consciousness of unspecified duration, initial encounter: S06.0X9A

## 2018-02-01 HISTORY — DX: Chronic kidney disease, unspecified: N18.9

## 2018-02-01 HISTORY — PX: ABDOMINAL AORTIC ENDOVASCULAR STENT GRAFT: SHX5707

## 2018-02-01 LAB — URINALYSIS, ROUTINE W REFLEX MICROSCOPIC
BILIRUBIN URINE: NEGATIVE
Glucose, UA: NEGATIVE mg/dL
Ketones, ur: NEGATIVE mg/dL
LEUKOCYTES UA: NEGATIVE
Nitrite: NEGATIVE
Protein, ur: NEGATIVE mg/dL
Specific Gravity, Urine: 1.012 (ref 1.005–1.030)
pH: 5 (ref 5.0–8.0)

## 2018-02-01 LAB — POCT I-STAT 7, (LYTES, BLD GAS, ICA,H+H)
Acid-base deficit: 7 mmol/L — ABNORMAL HIGH (ref 0.0–2.0)
Bicarbonate: 20 mmol/L (ref 20.0–28.0)
Calcium, Ion: 1.33 mmol/L (ref 1.15–1.40)
HCT: 34 % — ABNORMAL LOW (ref 39.0–52.0)
Hemoglobin: 11.6 g/dL — ABNORMAL LOW (ref 13.0–17.0)
O2 Saturation: 100 %
Potassium: 4.6 mmol/L (ref 3.5–5.1)
Sodium: 140 mmol/L (ref 135–145)
TCO2: 21 mmol/L — ABNORMAL LOW (ref 22–32)
pCO2 arterial: 45 mmHg (ref 32.0–48.0)
pH, Arterial: 7.256 — ABNORMAL LOW (ref 7.350–7.450)
pO2, Arterial: 282 mmHg — ABNORMAL HIGH (ref 83.0–108.0)

## 2018-02-01 LAB — PROTIME-INR
INR: 0.97
INR: 1.16
PROTHROMBIN TIME: 12.8 s (ref 11.4–15.2)
Prothrombin Time: 14.7 seconds (ref 11.4–15.2)

## 2018-02-01 LAB — APTT
aPTT: 36 seconds (ref 24–36)
aPTT: 39 seconds — ABNORMAL HIGH (ref 24–36)

## 2018-02-01 LAB — COMPREHENSIVE METABOLIC PANEL
ALT: 12 U/L (ref 0–44)
AST: 17 U/L (ref 15–41)
Albumin: 3.8 g/dL (ref 3.5–5.0)
Alkaline Phosphatase: 59 U/L (ref 38–126)
Anion gap: 9 (ref 5–15)
BUN: 19 mg/dL (ref 8–23)
CO2: 22 mmol/L (ref 22–32)
Calcium: 9.2 mg/dL (ref 8.9–10.3)
Chloride: 110 mmol/L (ref 98–111)
Creatinine, Ser: 1.54 mg/dL — ABNORMAL HIGH (ref 0.61–1.24)
GFR calc Af Amer: 53 mL/min — ABNORMAL LOW (ref >60)
GFR calc non Af Amer: 45 mL/min — ABNORMAL LOW (ref >60)
Glucose, Bld: 134 mg/dL — ABNORMAL HIGH (ref 70–99)
Potassium: 4.2 mmol/L (ref 3.5–5.1)
Sodium: 141 mmol/L (ref 135–145)
Total Bilirubin: 0.4 mg/dL (ref 0.3–1.2)
Total Protein: 6.6 g/dL (ref 6.5–8.1)

## 2018-02-01 LAB — CBC
HCT: 41.8 % (ref 39.0–52.0)
HCT: 36.4 % — ABNORMAL LOW (ref 39.0–52.0)
Hemoglobin: 13.2 g/dL (ref 13.0–17.0)
Hemoglobin: 11.9 g/dL — ABNORMAL LOW (ref 13.0–17.0)
MCH: 29 pg (ref 26.0–34.0)
MCH: 29.4 pg (ref 26.0–34.0)
MCHC: 31.6 g/dL (ref 30.0–36.0)
MCHC: 32.7 g/dL (ref 30.0–36.0)
MCV: 91.9 fL (ref 80.0–100.0)
MCV: 89.9 fL (ref 80.0–100.0)
NRBC: 0 % (ref 0.0–0.2)
Platelets: 326 10*3/uL (ref 150–400)
Platelets: 262 10*3/uL (ref 150–400)
RBC: 4.55 MIL/uL (ref 4.22–5.81)
RBC: 4.05 MIL/uL — ABNORMAL LOW (ref 4.22–5.81)
RDW: 15.9 % — ABNORMAL HIGH (ref 11.5–15.5)
RDW: 15.6 % — AB (ref 11.5–15.5)
WBC: 8.2 10*3/uL (ref 4.0–10.5)
WBC: 8.6 10*3/uL (ref 4.0–10.5)
nRBC: 0 % (ref 0.0–0.2)

## 2018-02-01 LAB — BASIC METABOLIC PANEL
Anion gap: 7 (ref 5–15)
BUN: 15 mg/dL (ref 8–23)
CO2: 21 mmol/L — ABNORMAL LOW (ref 22–32)
CREATININE: 1.35 mg/dL — AB (ref 0.61–1.24)
Calcium: 8.4 mg/dL — ABNORMAL LOW (ref 8.9–10.3)
Chloride: 111 mmol/L (ref 98–111)
GFR calc Af Amer: >60 mL/min (ref >60)
GFR calc non Af Amer: 53 mL/min — ABNORMAL LOW (ref >60)
Glucose, Bld: 117 mg/dL — ABNORMAL HIGH (ref 70–99)
Potassium: 4.7 mmol/L (ref 3.5–5.1)
Sodium: 139 mmol/L (ref 135–145)

## 2018-02-01 LAB — TYPE AND SCREEN
ABO/RH(D): O POS
Antibody Screen: NEGATIVE

## 2018-02-01 LAB — MAGNESIUM: Magnesium: 2.1 mg/dL (ref 1.7–2.4)

## 2018-02-01 LAB — SURGICAL PCR SCREEN
MRSA, PCR: NEGATIVE
Staphylococcus aureus: NEGATIVE

## 2018-02-01 LAB — ABO/RH: ABO/RH(D): O POS

## 2018-02-01 LAB — GLUCOSE, CAPILLARY: GLUCOSE-CAPILLARY: 120 mg/dL — AB (ref 70–99)

## 2018-02-01 LAB — POCT ACTIVATED CLOTTING TIME: Activated Clotting Time: 213 seconds

## 2018-02-01 SURGERY — INSERTION, ENDOVASCULAR STENT GRAFT, AORTA, ABDOMINAL
Anesthesia: General

## 2018-02-01 MED ORDER — POTASSIUM CHLORIDE CRYS ER 20 MEQ PO TBCR: 20.0000 meq | EXTENDED_RELEASE_TABLET | Freq: Every day | ORAL | Status: DC | PRN

## 2018-02-01 MED ORDER — SODIUM CHLORIDE 0.9 % IV SOLN
INTRAVENOUS | Status: DC | PRN
  Administered 2018-02-01: 20 ug/min via INTRAVENOUS

## 2018-02-01 MED ORDER — SODIUM CHLORIDE 0.9 % IV SOLN: 500.0000 mL | INTRAVENOUS | Status: DC

## 2018-02-01 MED ORDER — GUAIFENESIN-DM 100-10 MG/5ML PO SYRP: 15.0000 mL | ORAL_SOLUTION | ORAL | Status: DC | PRN

## 2018-02-01 MED ORDER — CHLORHEXIDINE GLUCONATE 4 % EX LIQD: 60.0000 mL | Freq: Once | CUTANEOUS | Status: DC

## 2018-02-01 MED ORDER — ONDANSETRON HCL 4 MG/2ML IJ SOLN
INTRAMUSCULAR | Status: DC | PRN
  Administered 2018-02-01: 4 mg via INTRAVENOUS

## 2018-02-01 MED ORDER — IODIXANOL 320 MG/ML IV SOLN
INTRAVENOUS | Status: DC | PRN
  Administered 2018-02-01: 150 mL via INTRAVENOUS

## 2018-02-01 MED ORDER — PROTAMINE SULFATE 10 MG/ML IV SOLN
INTRAVENOUS | Status: DC | PRN
  Administered 2018-02-01: 50 mg via INTRAVENOUS

## 2018-02-01 MED ORDER — LIDOCAINE 2% (20 MG/ML) 5 ML SYRINGE
INTRAMUSCULAR | Status: DC | PRN
  Administered 2018-02-01: 100 mg via INTRAVENOUS

## 2018-02-01 MED ORDER — SODIUM CHLORIDE 0.9 % IV SOLN
INTRAVENOUS | Status: AC
  Filled 2018-02-01: qty 1.2

## 2018-02-01 MED ORDER — ACETAMINOPHEN 325 MG RE SUPP: 325.0000 mg | RECTAL | Status: DC | PRN

## 2018-02-01 MED ORDER — ESMOLOL HCL 100 MG/10ML IV SOLN
INTRAVENOUS | Status: DC | PRN
  Administered 2018-02-01: 40 mg via INTRAVENOUS
  Administered 2018-02-01: 10 mg via INTRAVENOUS

## 2018-02-01 MED ORDER — EPHEDRINE SULFATE-NACL 50-0.9 MG/10ML-% IV SOSY
PREFILLED_SYRINGE | INTRAVENOUS | Status: DC | PRN
  Administered 2018-02-01: 5 mg via INTRAVENOUS

## 2018-02-01 MED ORDER — HYDRALAZINE HCL 20 MG/ML IJ SOLN: 5.0000 mg | INTRAMUSCULAR | Status: DC | PRN

## 2018-02-01 MED ORDER — ASPIRIN EC 81 MG PO TBEC
81.0000 mg | DELAYED_RELEASE_TABLET | Freq: Every day | ORAL | Status: DC
  Administered 2018-02-01 – 2018-02-02 (×2): 81 mg via ORAL
  Filled 2018-02-01 (×2): qty 1

## 2018-02-01 MED ORDER — ROCURONIUM BROMIDE 10 MG/ML (PF) SYRINGE
PREFILLED_SYRINGE | INTRAVENOUS | Status: DC | PRN
  Administered 2018-02-01: 50 mg via INTRAVENOUS
  Administered 2018-02-01: 20 mg via INTRAVENOUS

## 2018-02-01 MED ORDER — HYDROMORPHONE HCL 1 MG/ML IJ SOLN
INTRAMUSCULAR | Status: AC
  Filled 2018-02-01: qty 1

## 2018-02-01 MED ORDER — ALUM & MAG HYDROXIDE-SIMETH 200-200-20 MG/5ML PO SUSP: 15.0000 mL | ORAL | Status: DC | PRN

## 2018-02-01 MED ORDER — LIDOCAINE 2% (20 MG/ML) 5 ML SYRINGE
INTRAMUSCULAR | Status: AC
  Filled 2018-02-01: qty 5

## 2018-02-01 MED ORDER — PROMETHAZINE HCL 25 MG/ML IJ SOLN: 6.2500 mg | INTRAMUSCULAR | Status: DC | PRN

## 2018-02-01 MED ORDER — VASOPRESSIN 20 UNIT/ML IV SOLN
INTRAVENOUS | Status: AC
  Filled 2018-02-01: qty 1

## 2018-02-01 MED ORDER — PROPOFOL 10 MG/ML IV BOLUS
INTRAVENOUS | Status: DC | PRN
  Administered 2018-02-01: 150 mg via INTRAVENOUS

## 2018-02-01 MED ORDER — METOPROLOL TARTRATE 5 MG/5ML IV SOLN: 2.0000 mg | INTRAVENOUS | Status: DC | PRN

## 2018-02-01 MED ORDER — GLYCOPYRROLATE PF 0.2 MG/ML IJ SOSY
PREFILLED_SYRINGE | INTRAMUSCULAR | Status: DC | PRN
  Administered 2018-02-01: .1 mg via INTRAVENOUS

## 2018-02-01 MED ORDER — LACTATED RINGERS IV SOLN
INTRAVENOUS | Status: DC | PRN
  Administered 2018-02-01: 13:00:00 via INTRAVENOUS

## 2018-02-01 MED ORDER — PHENYLEPHRINE 40 MCG/ML (10ML) SYRINGE FOR IV PUSH (FOR BLOOD PRESSURE SUPPORT)
PREFILLED_SYRINGE | INTRAVENOUS | Status: DC | PRN
  Administered 2018-02-01: 80 ug via INTRAVENOUS

## 2018-02-01 MED ORDER — MUPIROCIN 2 % EX OINT
1.0000 "application " | TOPICAL_OINTMENT | Freq: Once | CUTANEOUS | Status: AC
  Administered 2018-02-01: 1 via TOPICAL
  Filled 2018-02-01: qty 22

## 2018-02-01 MED ORDER — ROCURONIUM BROMIDE 50 MG/5ML IV SOSY
PREFILLED_SYRINGE | INTRAVENOUS | Status: AC
  Filled 2018-02-01: qty 5

## 2018-02-01 MED ORDER — LACTATED RINGERS IV SOLN
INTRAVENOUS | Status: DC
  Administered 2018-02-01: 09:00:00 via INTRAVENOUS

## 2018-02-01 MED ORDER — OXYCODONE-ACETAMINOPHEN 5-325 MG PO TABS
1.0000 | ORAL_TABLET | ORAL | Status: DC | PRN
  Administered 2018-02-01: 2 via ORAL

## 2018-02-01 MED ORDER — DOCUSATE SODIUM 100 MG PO CAPS
100.0000 mg | ORAL_CAPSULE | Freq: Every day | ORAL | Status: DC
  Filled 2018-02-01: qty 1

## 2018-02-01 MED ORDER — PANTOPRAZOLE SODIUM 40 MG PO TBEC
40.0000 mg | DELAYED_RELEASE_TABLET | Freq: Every day | ORAL | Status: DC
  Administered 2018-02-02: 40 mg via ORAL
  Filled 2018-02-01 (×2): qty 1

## 2018-02-01 MED ORDER — LABETALOL HCL 5 MG/ML IV SOLN: 10.0000 mg | INTRAVENOUS | Status: DC | PRN

## 2018-02-01 MED ORDER — MAGNESIUM SULFATE 2 GM/50ML IV SOLN: 2.0000 g | Freq: Every day | INTRAVENOUS | Status: DC | PRN

## 2018-02-01 MED ORDER — PHENOL 1.4 % MT LIQD: 1.0000 | OROMUCOSAL | Status: DC | PRN

## 2018-02-01 MED ORDER — OXYCODONE-ACETAMINOPHEN 5-325 MG PO TABS
ORAL_TABLET | ORAL | Status: AC
  Filled 2018-02-01: qty 2

## 2018-02-01 MED ORDER — AMLODIPINE BESYLATE 10 MG PO TABS
10.0000 mg | ORAL_TABLET | Freq: Every day | ORAL | Status: DC
  Administered 2018-02-02: 10 mg via ORAL
  Filled 2018-02-01: qty 1

## 2018-02-01 MED ORDER — FENTANYL CITRATE (PF) 250 MCG/5ML IJ SOLN
INTRAMUSCULAR | Status: AC
  Filled 2018-02-01: qty 5

## 2018-02-01 MED ORDER — SODIUM CHLORIDE 0.9 % IV SOLN
INTRAVENOUS | Status: DC | PRN
  Administered 2018-02-01: 500 mL

## 2018-02-01 MED ORDER — ACETAMINOPHEN 325 MG PO TABS: 325.0000 mg | ORAL_TABLET | ORAL | Status: DC | PRN

## 2018-02-01 MED ORDER — MORPHINE SULFATE (PF) 2 MG/ML IV SOLN: 2.0000 mg | INTRAVENOUS | Status: DC | PRN

## 2018-02-01 MED ORDER — LISINOPRIL 40 MG PO TABS
40.0000 mg | ORAL_TABLET | Freq: Every day | ORAL | Status: DC
  Administered 2018-02-02: 40 mg via ORAL
  Filled 2018-02-01: qty 1

## 2018-02-01 MED ORDER — SODIUM CHLORIDE 0.9 % IV SOLN: INTRAVENOUS | Status: DC

## 2018-02-01 MED ORDER — OXYCODONE-ACETAMINOPHEN 5-325 MG PO TABS: 1.0000 | ORAL_TABLET | Freq: Four times a day (QID) | ORAL | 0 refills | Status: DC | PRN

## 2018-02-01 MED ORDER — HYDRALAZINE HCL 25 MG PO TABS
25.0000 mg | ORAL_TABLET | Freq: Two times a day (BID) | ORAL | Status: DC
  Administered 2018-02-01 – 2018-02-02 (×2): 25 mg via ORAL
  Filled 2018-02-01 (×2): qty 1

## 2018-02-01 MED ORDER — DEXAMETHASONE SODIUM PHOSPHATE 10 MG/ML IJ SOLN
INTRAMUSCULAR | Status: AC
  Filled 2018-02-01: qty 1

## 2018-02-01 MED ORDER — 0.9 % SODIUM CHLORIDE (POUR BTL) OPTIME
TOPICAL | Status: DC | PRN
  Administered 2018-02-01: 1000 mL

## 2018-02-01 MED ORDER — HEPARIN SODIUM (PORCINE) 1000 UNIT/ML IJ SOLN
INTRAMUSCULAR | Status: AC
  Filled 2018-02-01: qty 3

## 2018-02-01 MED ORDER — ONDANSETRON HCL 4 MG/2ML IJ SOLN: 4.0000 mg | Freq: Four times a day (QID) | INTRAMUSCULAR | Status: DC | PRN

## 2018-02-01 MED ORDER — CEFAZOLIN SODIUM-DEXTROSE 2-4 GM/100ML-% IV SOLN
2.0000 g | INTRAVENOUS | Status: AC
  Administered 2018-02-01: 2 g via INTRAVENOUS
  Filled 2018-02-01: qty 100

## 2018-02-01 MED ORDER — PROPOFOL 10 MG/ML IV BOLUS
INTRAVENOUS | Status: AC
  Filled 2018-02-01: qty 20

## 2018-02-01 MED ORDER — CEFAZOLIN SODIUM-DEXTROSE 2-4 GM/100ML-% IV SOLN
2.0000 g | Freq: Three times a day (TID) | INTRAVENOUS | Status: AC
  Administered 2018-02-01 – 2018-02-02 (×2): 2 g via INTRAVENOUS
  Filled 2018-02-01 (×2): qty 100

## 2018-02-01 MED ORDER — ONDANSETRON HCL 4 MG/2ML IJ SOLN
INTRAMUSCULAR | Status: AC
  Filled 2018-02-01: qty 2

## 2018-02-01 MED ORDER — FENTANYL CITRATE (PF) 100 MCG/2ML IJ SOLN
INTRAMUSCULAR | Status: DC | PRN
  Administered 2018-02-01: 25 ug via INTRAVENOUS
  Administered 2018-02-01: 100 ug via INTRAVENOUS

## 2018-02-01 MED ORDER — SUGAMMADEX SODIUM 200 MG/2ML IV SOLN
INTRAVENOUS | Status: DC | PRN
  Administered 2018-02-01: 200 mg via INTRAVENOUS

## 2018-02-01 MED ORDER — HYDROMORPHONE HCL 1 MG/ML IJ SOLN
0.2500 mg | INTRAMUSCULAR | Status: DC | PRN
  Administered 2018-02-01: 0.5 mg via INTRAVENOUS

## 2018-02-01 MED ORDER — HEPARIN SODIUM (PORCINE) 1000 UNIT/ML IJ SOLN
INTRAMUSCULAR | Status: DC | PRN
  Administered 2018-02-01: 6000 [IU] via INTRAVENOUS

## 2018-02-01 SURGICAL SUPPLY — 76 items
ADH SKN CLS APL DERMABOND .7 (GAUZE/BANDAGES/DRESSINGS) ×2
BLADE CLIPPER SURG (BLADE) ×2 IMPLANT
CANISTER SUCT 3000ML PPV (MISCELLANEOUS) ×2 IMPLANT
CATH ANGIO 5F BER2 65CM (CATHETERS) IMPLANT
CATH BEACON 5 .035 65 KMP TIP (CATHETERS) ×1 IMPLANT
CATH BEACON 5.038 65CM KMP-01 (CATHETERS) IMPLANT
CATH OMNI FLUSH .035X70CM (CATHETERS) ×1 IMPLANT
COVER PROBE W GEL 5X96 (DRAPES) ×2 IMPLANT
COVER WAND RF STERILE (DRAPES) ×2 IMPLANT
DERMABOND ADVANCED (GAUZE/BANDAGES/DRESSINGS) ×2
DERMABOND ADVANCED .7 DNX12 (GAUZE/BANDAGES/DRESSINGS) ×1 IMPLANT
DEVICE CLOSURE PERCLS PRGLD 6F (VASCULAR PRODUCTS) IMPLANT
DRAPE ZERO GRAVITY STERILE (DRAPES) ×2 IMPLANT
DRSG TEGADERM 2-3/8X2-3/4 SM (GAUZE/BANDAGES/DRESSINGS) ×4 IMPLANT
DRSG TEGADERM 4X4.75 (GAUZE/BANDAGES/DRESSINGS) ×4 IMPLANT
DRYSEAL FLEXSHEATH 12FR 33CM (SHEATH) ×1
DRYSEAL FLEXSHEATH 16FR 33CM (SHEATH) ×1
ELECT CAUTERY BLADE 6.4 (BLADE) ×2 IMPLANT
ELECT REM PT RETURN 9FT ADLT (ELECTROSURGICAL) ×4
ELECTRODE REM PT RTRN 9FT ADLT (ELECTROSURGICAL) ×2 IMPLANT
EXCLUDER TNK LEG 26MX14X14 (Endovascular Graft) IMPLANT
EXCLUDER TRUNK LEG 26MX14X14 (Endovascular Graft) ×2 IMPLANT
FILTER CO2 0.2 MICRON (VASCULAR PRODUCTS) IMPLANT
FILTER CO2 INSUFFLATOR AX1008 (MISCELLANEOUS) IMPLANT
GAUZE SPONGE 2X2 8PLY STRL LF (GAUZE/BANDAGES/DRESSINGS) ×2 IMPLANT
GLOVE BIO SURGEON STRL SZ 6.5 (GLOVE) ×1 IMPLANT
GLOVE BIO SURGEON STRL SZ7.5 (GLOVE) ×1 IMPLANT
GLOVE BIOGEL PI IND STRL 6.5 (GLOVE) IMPLANT
GLOVE BIOGEL PI IND STRL 7.5 (GLOVE) ×1 IMPLANT
GLOVE BIOGEL PI IND STRL 8 (GLOVE) IMPLANT
GLOVE BIOGEL PI INDICATOR 6.5 (GLOVE) ×2
GLOVE BIOGEL PI INDICATOR 7.5 (GLOVE) ×1
GLOVE BIOGEL PI INDICATOR 8 (GLOVE) ×1
GLOVE SURG SS PI 7.5 STRL IVOR (GLOVE) ×2 IMPLANT
GOWN STRL REUS W/ TWL LRG LVL3 (GOWN DISPOSABLE) ×2 IMPLANT
GOWN STRL REUS W/ TWL XL LVL3 (GOWN DISPOSABLE) ×1 IMPLANT
GOWN STRL REUS W/TWL LRG LVL3 (GOWN DISPOSABLE) ×4
GOWN STRL REUS W/TWL XL LVL3 (GOWN DISPOSABLE) ×4
GRAFT BALLN CATH 65CM (STENTS) IMPLANT
HEMOSTAT SNOW SURGICEL 2X4 (HEMOSTASIS) IMPLANT
KIT BASIN OR (CUSTOM PROCEDURE TRAY) ×2 IMPLANT
KIT TURNOVER KIT B (KITS) ×2 IMPLANT
LEG CONTRALATERAL 16X12X12 (Vascular Products) ×1 IMPLANT
LEG CONTRALATERAL 16X14.5X10 (Vascular Products) ×1 IMPLANT
NDL PERC 18GX7CM (NEEDLE) ×1 IMPLANT
NEEDLE PERC 18GX7CM (NEEDLE) ×2 IMPLANT
NS IRRIG 1000ML POUR BTL (IV SOLUTION) ×2 IMPLANT
PACK ENDOVASCULAR (PACKS) ×2 IMPLANT
PAD ARMBOARD 7.5X6 YLW CONV (MISCELLANEOUS) ×4 IMPLANT
PENCIL BUTTON HOLSTER BLD 10FT (ELECTRODE) ×2 IMPLANT
PERCLOSE PROGLIDE 6F (VASCULAR PRODUCTS) ×8
SET FLUSH CO2 (MISCELLANEOUS) IMPLANT
SET MICROPUNCTURE 5F STIFF (MISCELLANEOUS) ×1 IMPLANT
SHEATH AVANTI 11CM 8FR (SHEATH) IMPLANT
SHEATH BRITE TIP 8FR 23CM (SHEATH) ×1 IMPLANT
SHEATH DRYSEAL FLEX 12FR 33CM (SHEATH) IMPLANT
SHEATH DRYSEAL FLEX 16FR 33CM (SHEATH) IMPLANT
SHEATH PINNACLE 8F 10CM (SHEATH) ×1 IMPLANT
SHIELD RADPAD SCOOP 12X17 (MISCELLANEOUS) ×4 IMPLANT
SPONGE GAUZE 2X2 STER 10/PKG (GAUZE/BANDAGES/DRESSINGS) ×2
STENT GRAFT BALLN CATH 65CM (STENTS) ×1
STOPCOCK MORSE 400PSI 3WAY (MISCELLANEOUS) ×2 IMPLANT
SUT ETHILON 3 0 PS 1 (SUTURE) IMPLANT
SUT PROLENE 5 0 C 1 24 (SUTURE) IMPLANT
SUT VIC AB 2-0 CT1 27 (SUTURE)
SUT VIC AB 2-0 CT1 TAPERPNT 27 (SUTURE) IMPLANT
SUT VIC AB 3-0 SH 27 (SUTURE)
SUT VIC AB 3-0 SH 27X BRD (SUTURE) IMPLANT
SUT VICRYL 4-0 PS2 18IN ABS (SUTURE) IMPLANT
SYR 30ML LL (SYRINGE) ×2 IMPLANT
TOWEL GREEN STERILE (TOWEL DISPOSABLE) ×2 IMPLANT
TRAY FOLEY MTR SLVR 16FR STAT (SET/KITS/TRAYS/PACK) ×2 IMPLANT
TUBING HIGH PRESSURE 120CM (CONNECTOR) ×2 IMPLANT
WIRE AMPLATZ SS-J .035X180CM (WIRE) ×2 IMPLANT
WIRE BENTSON .035X145CM (WIRE) ×2 IMPLANT
WIRE TORQFLEX AUST .018X40CM (WIRE) ×1 IMPLANT

## 2018-02-01 NOTE — Discharge Instructions (Signed)
   [facility] [place]   Discharge Instructions  Endovascular Aortic Aneurysm Repair  Please refer to the following instructions for your post-procedure care. Your surgeon or Physician Assistant will discuss any changes with you.  Activity  You are encouraged to walk as much as you can. You can slowly return to normal activities but must avoid strenuous activity and heavy lifting until your doctor tells you it's OK. Avoid activities such as vacuuming or swinging a gold club. It is normal to feel tired for several weeks after your surgery. Do not drive until your doctor gives the OK and you are no longer taking prescription pain medications. It is also normal to have difficulty with sleep habits, eating, and bowel movements after surgery. These will go away with time.  Bathing/Showering  You may shower after you go home. If you have an incision, do not soak in a bathtub, hot tub, or swim until the incision heals completely.  Incision Care  Shower every day. Clean your incision with mild soap and water. Pat the area dry with a clean towel. You do not need a bandage unless otherwise instructed. Do not apply any ointments or creams to your incision. If you clothing is irritating, you may cover your incision with a dry gauze pad.  Diet  Resume your normal diet. There are no special food restrictions following this procedure. A low fat/low cholesterol diet is recommended for all patients with vascular disease. In order to heal from your surgery, it is CRITICAL to get adequate nutrition. Your body requires vitamins, minerals, and protein. Vegetables are the best source of vitamins and minerals. Vegetables also provide the perfect balance of protein. Processed food has little nutritional value, so try to avoid this.  Medications  Resume taking all of your medications unless your doctor or nurse practitioner tells you not to. If your incision is causing pain, you may take  over-the-counter pain relievers such as acetaminophen (Tylenol). If you were prescribed a stronger pain medication, please be aware these medications can cause nausea and constipation. Prevent nausea by taking the medication with a snack or meal. Avoid constipation by drinking plenty of fluids and eating foods with a high amount of fiber, such as fruits, vegetables, and grains. Do not take Tylenol if you are taking prescription pain medications.   Follow up  Our office will schedule a follow-up appointment with a C.T. scan 3-4 weeks after your surgery.  Please call us immediately for any of the following conditions  Severe or worsening pain in your legs or feet or in your abdomen back or chest. Increased pain, redness, drainage (pus) from your incision sit. Increased abdominal pain, bloating, nausea, vomiting or persistent diarrhea. Fever of 101 degrees or higher. Swelling in your leg (s),  Reduce your risk of vascular disease  Stop smoking. If you would like help call [facility] at 1-800-QUIT-NOW (1-800-784-8669) or [facility] at 336-586-4000. Manage your cholesterol Maintain a desired weight Control your diabetes Keep your blood pressure down  If you have questions, please call the office at [phone].   

## 2018-02-01 NOTE — Anesthesia Postprocedure Evaluation (Signed)
Anesthesia Post Note  Patient: Mark [name]  Procedure(s) Performed: ABDOMINAL AORTIC ENDOVASCULAR STENT GRAFT (N/A )     Patient location during evaluation: PACU Anesthesia Type: General Level of consciousness: sedated Pain management: pain level controlled Vital Signs Assessment: post-procedure vital signs reviewed and stable Respiratory status: spontaneous breathing and respiratory function stable Cardiovascular status: stable Postop Assessment: no apparent nausea or vomiting Anesthetic complications: no    Last Vitals:  Vitals:   02/01/18 1555 02/01/18 1610  BP: 126/70   Pulse: (!) 45   Resp: (!) 8   Temp:  36.5 C  SpO2: 97%     Last Pain:  Vitals:   02/01/18 1636  TempSrc:   PainSc: 6                  Megahn Killings DANIEL

## 2018-02-01 NOTE — Anesthesia Procedure Notes (Signed)
Procedure Name: Intubation Performed by: Milford Cage, CRNA Pre-anesthesia Checklist: Patient identified, Emergency Drugs available, Suction available and Patient being monitored Patient Re-evaluated:Patient Re-evaluated prior to induction Oxygen Delivery Method: Circle System Utilized Preoxygenation: Pre-oxygenation with 100% oxygen Induction Type: IV induction Ventilation: Mask ventilation without difficulty Laryngoscope Size: Mac and 4 Grade View: Grade II Tube type: Oral Tube size: 7.5 mm Number of attempts: 1 Airway Equipment and Method: Stylet Placement Confirmation: ETT inserted through vocal cords under direct vision,  positive ETCO2 and breath sounds checked- equal and bilateral Secured at: 23 cm Tube secured with: Tape Dental Injury: Teeth and Oropharynx as per pre-operative assessment

## 2018-02-01 NOTE — H&P (Signed)
[facility] [place]  Patient name: Mark [name]  MRN: [id number]        DOB: 1950/05/27          Sex: male   REASON FOR VISIT:    Follow up  HISOTRY OF PRESENT ILLNESS:    Mark [name] is a 67 y.o. male who returns with a CTA for follow up of his AAA.  Patient has a history of peripheral vascular disease. He states that he had 5 stents placed many many years ago. He is medically managed for hypertension. He has hypercholesterolemia but is intolerant of statins. He is a current smoker, approximately 2 packs/day. He states that he can walk up about 5 flights of steps before he gets short of breath. He does walk approximately 1/4-1/3 of a mile without difficulty. He does have a history of nonischemic cardiomyopathy, followed by Dr. Leeann Must. He has a history of stroke in 2016. His carotid arteries were normal at that time. 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   PAST MEDICAL HISTORY:       Past Medical History:  Diagnosis Date  . Coronary artery disease   . Diabetes mellitus without complication (HCC)   . Hypertension   . PVD (peripheral vascular disease) (HCC)      FAMILY HISTORY:        Family History  Problem Relation Age of Onset  . Dementia Mother   . Diabetes Mother   . Heart disease Mother   . Diabetes Father   . Heart disease Father     SOCIAL HISTORY:   Social History       Tobacco Use  . Smoking status: Current Every Day Smoker  . Smokeless tobacco: Never Used  Substance Use Topics  . Alcohol use: No     ALLERGIES:        Allergies  Allergen Reactions  . Pravastatin Other (See Comments)    Pain & weakness  . Orajel Mouth-Aid [Carbamide Peroxide]   . Rosuvastatin   . Sudafed [Pseudoephedrine]      CURRENT MEDICATIONS:         Current Outpatient Medications  Medication Sig Dispense Refill  . amLODipine (NORVASC) 10  MG tablet Take 10 mg by mouth daily.    Marland Kitchen aspirin 81 MG chewable tablet Chew by mouth.    Marland Kitchen aspirin EC 81 MG tablet Take 81 mg by mouth daily.    . cloNIDine (CATAPRES) 0.1 MG tablet Take 1 tablet (0.1 mg total) by mouth 2 (two) times daily. 60 tablet 11  . cyclobenzaprine (FLEXERIL) 10 MG tablet Take 10 mg by mouth 3 (three) times daily as needed for muscle spasms.    . hydrALAZINE (APRESOLINE) 25 MG tablet Take 25 mg by mouth 3 (three) times daily.    Marland Kitchen ibuprofen (ADVIL,MOTRIN) 200 MG tablet Take 400 mg by mouth every 6 (six) hours as needed for moderate pain.     Marland Kitchen lisinopril (PRINIVIL,ZESTRIL) 40 MG tablet Take 40 mg by mouth daily.    . valsartan (DIOVAN) 320 MG tablet Take 320 mg by mouth daily.     No current facility-administered medications for this visit.     REVIEW OF SYSTEMS:   [X]  denotes positive finding, [ ]  denotes negative finding Cardiac  Comments:  Chest pain or chest pressure:    Shortness of breath upon exertion:    Short of breath when lying flat:    Irregular heart rhythm:  Vascular    Pain in calf, thigh, or hip brought on by ambulation: x   Pain in feet at night that wakes you up from your sleep:  x   Blood clot in your veins:    Leg swelling:         Pulmonary    Oxygen at home:    Productive cough:     Wheezing:         Neurologic    Sudden weakness in arms or legs:     Sudden numbness in arms or legs:     Sudden onset of difficulty speaking or slurred speech:    Temporary loss of vision in one eye:     Problems with dizziness:         Gastrointestinal    Blood in stool:     Vomited blood:         Genitourinary    Burning when urinating:     Blood in urine:        Psychiatric    Major depression:         Hematologic    Bleeding problems:    Problems with blood clotting too easily:        Skin    Rashes or ulcers:          Constitutional    Fever or chills:      PHYSICAL EXAM:   There were no vitals filed for this visit.  GENERAL: The patient is a well-nourished male, in no acute distress. The vital signs are documented above. CARDIAC: There is a regular rate and rhythm.  PULMONARY: Non-labored respirations ABDOMEN: Soft and non-tender with normal pitched bowel sounds.  MUSCULOSKELETAL: There are no major deformities or cyanosis. NEUROLOGIC: No focal weakness or paresthesias are detected. SKIN: There are no ulcers or rashes noted. PSYCHIATRIC: The patient has a normal affect.  STUDIES:   I have ordered and reviewed the following studies:  Lower extremity duplex: Right: 50-74% stenosis noted in the mid SFA. Note: Native artery stenosis criteria was used. Unable to identify stents.  Left: 50-74% stenosis noted in the mid-dist SFA. Note: Native artery stenosis criteria was used. Unable to identify stents.  ABI: ABI/TBIToday's ABIToday's TBIPrevious ABIPrevious TBI +-------+-----------+-----------+------------+------------+ Right 0.92    0.62                 +-------+-----------+-----------+------------+------------+ Left  0.88    0.49                 +-------+-----------+-----------+------------+------------+  Carotid: Right Carotid: Velocities in the right ICA are consistent with a 1-39% stenosis.  Left Carotid: Velocities in the left ICA are consistent with a 1-39% stenosis.  Vertebrals: Bilateral vertebral arteries demonstrate antegrade flow. Subclavians: Normal flow hemodynamics were seen in bilateral subclavian       arteries.  CTA Chest: :1. Aortic atherosclerosis without evidence of aneurysmal disease of the thoracic aorta. 2. Coronary atherosclerosis with mild amount of calcified plaque at the level of the distal left main and LAD. 3. Advanced diffuse centrilobular emphysema without evidence  of pulmonary nodule or acute process  CTA A/P: 1. Infrarenal abdominal aortic aneurysm with greatest transverse dimensions of 4.8 x 5.5 cm. There is an infrarenal neck present measuring 2.1 x 2.2 cm. 2. Both kidneys are supplied by paired renal arteries emanating in close proximity off of the abdominal aorta. Small accessory lower pole renal arteries emanate immediately inferior to the dominant bilateral renal arteries. 3. 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 4.  Right lower extremity demonstrates mild aneurysmal disease of the proximal and distal right common iliac artery and significant focal stenosis of the mid right common iliac artery of approximately 75-80% narrowing. Stented right external iliac artery shows no significant recurrent stenosis. There is a 50-60% stenosis of the right SFA at the juncture of middle and distal thirds. Continuous peroneal and posterior tibial runoff on the right with reconstituted distal anterior tibial artery. 5. Left lower extremity demonstrates stented left external iliac artery with no significant recurrent stenosis. There is a focal 60-75% stenosis of the distal left SFA at the adductor hiatus. 40-50% tibioperoneal trunk stenosis. Three-vessel runoff in the left lower leg.    MEDICAL ISSUES:   AAA: After reviewing the CT scan, I feel the patient is a candidate for endovascular repair.  He does have a stenosis within the right common iliac artery that will be able to be addressed with stent graft repair.  I discussed the details of the procedure including the risks and benefits which include but are not limited to bleeding, renal insufficiency, intestinal ischemia, lower extremity ischemia.  All of his questions have been answered.  His operation will be scheduled within the next few weeks  Claudication: The patient CT scan shows bilateral superficial femoral artery stenosis as well as a right common iliac lesion.  I would like to see how his symptoms  improve after endovascular repair which will address his iliac lesion.  Because he is still smoking and is tolerating his symptoms, I would like to defer any intervention on the superficial femoral arteries as long as his symptoms are tolerable.  Smoking: We discussed the importance of smoking cessation moving forward.  I pointed out that he does have emphysema on his CT scan.  Hopefully this will be a opportunity for him to stop.  He appeared to be somewhat open to stopping.    Durene Cal, MD [facility] [phone] Pager [phone]   No complaints today No Abd pain  PE: CV:RRR CTAB Abd soft  Plan to proceed with [facility].  All questions answered.  Durene Cal

## 2018-02-01 NOTE — Anesthesia Procedure Notes (Signed)
Arterial Line Insertion Start/End12/07/2017 9:25 AM Performed by: Mayer Camel, CRNA, CRNA  Patient location: Pre-op. Preanesthetic checklist: patient identified, IV checked, site marked, risks and benefits discussed, surgical consent, monitors and equipment checked, pre-op evaluation, timeout performed and anesthesia consent Lidocaine 1% used for infiltration Left, radial was placed Catheter size: 20 G Hand hygiene performed  and maximum sterile barriers used   Attempts: 1 Procedure performed without using ultrasound guided technique. Following insertion, Biopatch and dressing applied. Post procedure assessment: normal  Patient tolerated the procedure well with no immediate complications.

## 2018-02-01 NOTE — Op Note (Signed)
Patient name: Mark [name] MRN: [id number] DOB: December 09, 1950 Sex: male  02/01/2018 Pre-operative Diagnosis: AAA Post-operative diagnosis:  Same Surgeon:  Durene Cal Assistants:  Aggie Moats Procedure:   #1: Endovascular repair of abdominal aortic aneurysm   #2: Ultrasound-guided bilateral percutaneous access with closure   #3: Catheter in aorta x2   #4: Abdominal aortogram Anesthesia: General Blood Loss:  50cc Specimens: None  Findings: Complete exclusion Devices used: Main body was primary right Gore 26 x 14 x 14.  Ipsilateral right was a Gore 14 x 10.  Contralateral left was a Gore 12 x 12  Indications: Patient was found to have a 5.5 cm aneurysm on CT scan.  He comes in today for repair.  He has bilateral external iliac stents and a known occlusion of the left hypogastric artery.  Procedure:  The patient was identified in the holding area and taken to [facility] OR ROOM 16  The patient was then placed supine on the table. general anesthesia was administered.  The patient was prepped and draped in the usual sterile fashion.  A time out was called and antibiotics were administered.  Ultrasound was used to evaluate bilateral common femoral arteries which were widely patent and calcified.  A digital ultrasound image was acquired.  A #11 blade was used to make a skin nick.  Bilateral common femoral arteries were then cannulated under ultrasound guidance with a micropuncture needle.  An 018 wire was advanced without resistance and a micropuncture sheath was placed.  035 Bentson wires were inserted into the abdominal aorta under fluoroscopic visualization.  The subtenons tract was dilated with an 8 [place] dilator.  Pro-glide devices were deployed at the 11:00 and 1 o'clock position for pre-closure.  8 French sheaths were placed bilaterally.  The patient was fully heparinized.  Heparin levels were monitored with ACT measurements and redosed appropriately.  Over an Amplatz superstiff wire, a 16  French sheath was advanced up the right groin into the aorta.  An Omni Flush catheter was advanced up the left side.  The main body device was prepared on the back table.  This was a Gore 26 x 14 x 14 device.  It was inserted into the aorta.  An abdominal aortogram was performed locating the renal arteries.  The main body device was then deployed landing just distal to the renal arteries down to the contralateral gate.  Next, the contralateral gate was cannulated with an Omni Flush catheter and a Bentson wire.  The catheter was advanced into the main body and was able to be be freely rotated, confirming successful cannulation.  An Amplatz superstiff wire was performed.  The image detector was rotated to the right anterior oblique position and a retrograde injection from the sheath was performed.  This showed that the left hypogastric artery origin was occluded which was a known finding.  A 12 French dry seal sheath was then inserted into the contralateral gate.  The left contralateral extension was then prepared and inserted this was a Gore 12 x 12 device.  It was deployed landing in the distal left common iliac artery.  Next, the image detector was rotated to a left anterior oblique position.  The remaining portion of the ipsilateral device was deployed after locating the origin of the right hypogastric artery.  A ipsilateral extension was prepared on the back table.  This was a Gore 14 x 10 device.  It was then deployed landing proximal to the origin of the left hypogastric artery.  Next, a MOB balloon was used to mold the proximal and distal attachment sites as well as device overlap.  I also aggressively inflated the balloon within the right common iliac artery where he had a known stenosis.  A completion arteriogram was performed which showed continued patency of bilateral renal arteries as well as bilateral iliac arteries and the right hypogastric artery.  There did appear to be a type I a endoleak and so I  reinserted the MOB balloon and repeated molding of the proximal portion of the device.  An additional arteriogram was performed which showed resolution of the type Ia endoleak.  The stiff wires were exchanged out for Bentson wires, and the sheaths were removed by securing the pro-glide devices for arteriotomy closure.  Both groins were hemostatic.  50 mg of protamine was given.  Manual pressure was held on the groins for 5 minutes.  The patient had brisk Doppler signals at the feet.  Bovie cautery was used within the sheath tracts.  Dermabond was placed.  The patient was successfully extubated and taken to recovery in stable condition.  There were no immediate complications.   Disposition: To PACU stable.   Juleen China, M.D. [facility] [place] Office: [phone] Pager:  [phone]

## 2018-02-01 NOTE — Discharge Summary (Signed)
[facility] Discharge Summary   Mark [name] IV 02-16-1951 67 y.o. male  MRN: [id number]  Admission Date: 02/01/2018  Discharge Date: 02/02/18  Physician: Nada Libman, MD  Admission Diagnosis: ABDOMINAL AORTIC ANEURYSM  Discharge Day services:    see progress note 02/02/18 Physical Exam: Vitals:   02/01/18 1555 02/01/18 1610  BP: 126/70   Pulse: (!) 45   Resp: (!) 8   Temp:  97.7 F (36.5 C)  SpO2: 97%     Hospital Course:  The patient was admitted to the hospital and taken to the operating room on 02/01/2018 and underwent: Endovascular repair of abdominal aortic aneurysm    The pt tolerated the procedure well and was transported to the PACU in good condition.   The remainder of the hospital course consisted of increasing mobilization and increasing intake of solids without difficulty.  He will be prescribed 2-3 days of narcotic pain medication for continued post operative pain control.  He will follow up in office in 4 weeks with CTA endograft protocol.  Discharge instructions were reviewed with the patient and he voices his understanding.  He will be discharged in stable condition.  CBC    Component Value Date/Time   WBC 8.6 02/01/2018 1447   RBC 4.05 (L) 02/01/2018 1447   HGB 11.9 (L) 02/01/2018 1447   HCT 36.4 (L) 02/01/2018 1447   PLT 262 02/01/2018 1447   MCV 89.9 02/01/2018 1447   MCH 29.4 02/01/2018 1447   MCHC 32.7 02/01/2018 1447   RDW 15.6 (H) 02/01/2018 1447    BMET    Component Value Date/Time   NA 139 02/01/2018 1447   K 4.7 02/01/2018 1447   CL 111 02/01/2018 1447   CO2 21 (L) 02/01/2018 1447   GLUCOSE 117 (H) 02/01/2018 1447   BUN 15 02/01/2018 1447   CREATININE 1.35 (H) 02/01/2018 1447   CALCIUM 8.4 (L) 02/01/2018 1447   GFRNONAA 53 (L) 02/01/2018 1447   GFRAA >60 02/01/2018 1447         Discharge Diagnosis:  ABDOMINAL AORTIC ANEURYSM  Secondary Diagnosis: Patient Active Problem List   Diagnosis Date Noted  . S/P AAA  repair 02/01/2018   Past Medical History:  Diagnosis Date  . Chronic kidney disease    CKD stage 3  . Concussion    multiple in high school  . Coronary artery disease    pt denies  . Diabetes mellitus without complication (HCC)    borderline  . Hypertension   . PVD (peripheral vascular disease) (HCC)    leg stents femoral artery  . Stroke [facility])    3 years ago- no residual     Allergies as of 02/01/2018      Reactions   Orajel Mouth-aid [carbamide Peroxide] Swelling   Swelling   Pravastatin Other (See Comments)   Pain & weakness   Rosuvastatin    UNSPECIFIED REACTION    Sudafed [pseudoephedrine] Other (See Comments)   Pt couldn't sleep      Medication List    TAKE these medications   amLODipine 10 MG tablet Commonly known as:  NORVASC Take 10 mg by mouth daily.   aspirin 81 MG tablet Take 81 mg by mouth daily.   cyclobenzaprine 10 MG tablet Commonly known as:  FLEXERIL Take 10 mg by mouth 3 (three) times daily as needed for muscle spasms.   hydrALAZINE 25 MG tablet Commonly known as:  APRESOLINE Take 25 mg by mouth 2 (two) times daily.   ibuprofen 200 MG  tablet Commonly known as:  ADVIL,MOTRIN Take 400 mg by mouth every 6 (six) hours as needed for moderate pain.   lisinopril 40 MG tablet Commonly known as:  PRINIVIL,ZESTRIL Take 40 mg by mouth daily.   oxyCODONE-acetaminophen 5-325 MG tablet Commonly known as:  PERCOCET/ROXICET Take 1 tablet by mouth every 6 (six) hours as needed for moderate pain.       Discharge Instructions:   [facility]  Discharge Instructions Endovascular Aortic Aneurysm Repair  Please refer to the following instructions for your post-procedure care. Your surgeon or Physician Assistant will discuss any changes with you.  Activity  You are encouraged to walk as much as you can. You can slowly return to normal activities but must avoid strenuous activity and heavy lifting until your doctor  tells you it's OK. Avoid activities such as vacuuming or swinging a gold club. It is normal to feel tired for several weeks after your surgery. Do not drive until your doctor gives the OK and you are no longer taking prescription pain medications. It is also normal to have difficulty with sleep habits, eating, and bowel movements after surgery. These will go away with time.  Bathing/Showering  You may shower after you go home. If you have an incision, do not soak in a bathtub, hot tub, or swim until the incision heals completely.  Incision Care  Shower every day. Clean your incision with mild soap and water. Pat the area dry with a clean towel. You do not need a bandage unless otherwise instructed. Do not apply any ointments or creams to your incision. If you clothing is irritating, you may cover your incision with a dry gauze pad.  Diet  Resume your normal diet. There are no special food restrictions following this procedure. A low fat/low cholesterol diet is recommended for all patients with vascular disease. In order to heal from your surgery, it is CRITICAL to get adequate nutrition. Your body requires vitamins, minerals, and protein. Vegetables are the best source of vitamins and minerals. Vegetables also provide the perfect balance of protein. Processed food has little nutritional value, so try to avoid this.  Medications  Resume taking all of your medications unless your doctor or Physician Assistnat tells you not to. If your incision is causing pain, you may take over-the-counter pain relievers such as acetaminophen (Tylenol). If you were prescribed a stronger pain medication, please be aware these medications can cause nausea and constipation. Prevent nausea by taking the medication with a snack or meal. Avoid constipation by drinking plenty of fluids and eating foods with a high amount of fiber, such as fruits, vegetables, and grains. Do not take Tylenol if you are taking prescription pain  medications.   Follow up  Our office will schedule a follow-up appointment with a C.T. scan 3-4 weeks after your surgery.  Please call us immediately for any of the following conditions  Severe or worsening pain in your legs or feet or in your abdomen back or chest. Increased pain, redness, drainage (pus) from your incision sit. Increased abdominal pain, bloating, nausea, vomiting or persistent diarrhea. Fever of 101 degrees or higher. Swelling in your leg (s),  Reduce your risk of vascular disease  .Stop smoking. If you would like help call [facility] at 1-800-QUIT-NOW ((931) 093-4421) or [place] at (628) 062-6610. .Manage your cholesterol .Maintain a desired weight .Control your diabetes .Keep your blood pressure down  If you have questions, please call the office at [phone].  Disposition: home  Patient's condition: is Good  Follow up: 1. Dr. Myra Gianotti in 4 weeks with CTA protocol   Emilie Rutter, PA-C [facility] [phone] 02/01/2018  5:10 PM   - For VQI Registry use - Post-op:  Time to Extubation: [x]  In OR, [ ]  < 12 hrs, [ ]  12-24 hrs, [ ]  >=24 hrs Vasopressors Req. Post-op: No MI: No., [ ]  Troponin only, [ ]  EKG or Clinical New Arrhythmia: No CHF: No ICU Stay: 0 day in  Transfusion: No     If yes,  units given  Complications: Resp failure: No., [ ]  Pneumonia, [ ]  Ventilator Chg in renal function: No., [ ]  Inc. Cr > 0.5, [ ]  Temp. Dialysis,  [ ]  Permanent dialysis Leg ischemia: No., no Surgery needed, [ ]  Yes, Surgery needed,  [ ]  Amputation Bowel ischemia: No., [ ]  Medical Rx, [ ]  Surgical Rx Wound complication: No., [ ]  Superficial separation/infection, [ ]  Return to OR Return to OR: No  Return to OR for bleeding: No Stroke: No., [ ]  Minor, [ ]  Major  Discharge medications: Statin use:  No  ASA use:  Yes  Plavix use:  No  Beta blocker use:  No  ARB use:  No ACEI use:  Yes CCB use:  Yes

## 2018-02-01 NOTE — Transfer of Care (Signed)
Immediate Anesthesia Transfer of Care Note  Patient: Mark [name]  Procedure(s) Performed: ABDOMINAL AORTIC ENDOVASCULAR STENT GRAFT (N/A )  Patient Location: PACU  Anesthesia Type:General  Level of Consciousness: awake, alert  and oriented  Airway & Oxygen Therapy: Patient Spontanous Breathing and Patient connected to face mask oxygen  Post-op Assessment: Report given to RN and Post -op Vital signs reviewed and stable  Post vital signs: Reviewed and stable  Last Vitals:  Vitals Value Taken Time  BP 131/67 02/01/2018  2:41 PM  Temp 36.5 C 02/01/2018  2:40 PM  Pulse 50 02/01/2018  2:43 PM  Resp 15 02/01/2018  2:43 PM  SpO2 99 % 02/01/2018  2:43 PM  Vitals shown include unvalidated device data.  Last Pain:  Vitals:   02/01/18 1440  TempSrc:   PainSc: (P) 0-No pain      Patients Stated Pain Goal: 2 (02/01/18 0818)  Complications: No apparent anesthesia complications

## 2018-02-02 LAB — BASIC METABOLIC PANEL
Anion gap: 8 (ref 5–15)
BUN: 16 mg/dL (ref 8–23)
CO2: 23 mmol/L (ref 22–32)
Calcium: 8.8 mg/dL — ABNORMAL LOW (ref 8.9–10.3)
Chloride: 106 mmol/L (ref 98–111)
Creatinine, Ser: 1.4 mg/dL — ABNORMAL HIGH (ref 0.61–1.24)
GFR calc Af Amer: 59 mL/min — ABNORMAL LOW (ref >60)
GFR calc non Af Amer: 51 mL/min — ABNORMAL LOW (ref >60)
Glucose, Bld: 166 mg/dL — ABNORMAL HIGH (ref 70–99)
Potassium: 4.5 mmol/L (ref 3.5–5.1)
SODIUM: 137 mmol/L (ref 135–145)

## 2018-02-02 LAB — CBC
HCT: 36.8 % — ABNORMAL LOW (ref 39.0–52.0)
Hemoglobin: 12.1 g/dL — ABNORMAL LOW (ref 13.0–17.0)
MCH: 29.4 pg (ref 26.0–34.0)
MCHC: 32.9 g/dL (ref 30.0–36.0)
MCV: 89.5 fL (ref 80.0–100.0)
Platelets: 278 10*3/uL (ref 150–400)
RBC: 4.11 MIL/uL — ABNORMAL LOW (ref 4.22–5.81)
RDW: 15.5 % (ref 11.5–15.5)
WBC: 10.1 10*3/uL (ref 4.0–10.5)
nRBC: 0 % (ref 0.0–0.2)

## 2018-02-02 MED ORDER — HYDRALAZINE HCL 25 MG PO TABS: 25.0000 mg | ORAL_TABLET | Freq: Two times a day (BID) | ORAL | 0 refills | Status: DC

## 2018-02-02 MED ORDER — LISINOPRIL 40 MG PO TABS: 40.0000 mg | ORAL_TABLET | Freq: Every day | ORAL | 0 refills | Status: DC

## 2018-02-02 MED ORDER — AMLODIPINE BESYLATE 10 MG PO TABS: 10.0000 mg | ORAL_TABLET | Freq: Every day | ORAL | 0 refills | Status: DC

## 2018-02-02 MED ORDER — ASPIRIN 81 MG PO TBEC: 81.0000 mg | DELAYED_RELEASE_TABLET | Freq: Every day | ORAL | 0 refills | Status: DC

## 2018-02-02 NOTE — Progress Notes (Signed)
Pt up to bathroom and ambulated in room independently. No pain or shortness of breath.  Pt has voided.

## 2018-02-02 NOTE — Progress Notes (Signed)
Patient is discharged per MD orders, PIV and telemetry discontinued. All discharge education and instructions, including follow up care were provided to the patient. Patient was transported via wheelchair with all personal belongings, to front entrance, for transportation home with family.

## 2018-02-02 NOTE — Plan of Care (Signed)

## 2018-02-02 NOTE — Progress Notes (Signed)
    Subjective  - POD #1, s/p EVAR  No complaints Ambulated, voided and walked   Physical Exam:  Groins soft Feet warm abd soft       Assessment/Plan:  POD #1  D/c home  Mark [name] 02/02/2018 9:27 AM --  Vitals:   02/01/18 2353 02/02/18 0411  BP: 136/67 127/64  Pulse: (!) 55 60  Resp: 14 14  Temp: 97.7 F (36.5 C) 98 F (36.7 C)  SpO2: 96% 96%    Intake/Output Summary (Last 24 hours) at 02/02/2018 0927 Last data filed at 02/02/2018 0429 Gross per 24 hour  Intake 1640 ml  Output 1175 ml  Net 465 ml     Laboratory CBC    Component Value Date/Time   WBC 10.1 02/02/2018 0409   HGB 12.1 (L) 02/02/2018 0409   HCT 36.8 (L) 02/02/2018 0409   PLT 278 02/02/2018 0409    BMET    Component Value Date/Time   NA 137 02/02/2018 0409   K 4.5 02/02/2018 0409   CL 106 02/02/2018 0409   CO2 23 02/02/2018 0409   GLUCOSE 166 (H) 02/02/2018 0409   BUN 16 02/02/2018 0409   CREATININE 1.40 (H) 02/02/2018 0409   CALCIUM 8.8 (L) 02/02/2018 0409   GFRNONAA 51 (L) 02/02/2018 0409   GFRAA 59 (L) 02/02/2018 0409    COAG Lab Results  Component Value Date   INR 1.16 02/01/2018   INR 0.97 02/01/2018   No results found for: PTT  Antibiotics Anti-infectives (From admission, onward)   Start     Dose/Rate Route Frequency Ordered Stop   02/01/18 1930  ceFAZolin (ANCEF) IVPB 2g/100 mL premix     2 g 200 mL/hr over 30 Minutes Intravenous Every 8 hours 02/01/18 1845 02/02/18 0438   02/01/18 0740  ceFAZolin (ANCEF) IVPB 2g/100 mL premix     2 g 200 mL/hr over 30 Minutes Intravenous 30 min pre-op 02/01/18 0740 02/01/18 1315       V. Charlena Cross, M.D. [facility] Office: [phone] Pager:  [phone]

## 2018-02-04 ENCOUNTER — Encounter (HOSPITAL_COMMUNITY): Payer: Self-pay | Admitting: Surgery

## 2018-02-04 LAB — POCT ACTIVATED CLOTTING TIME: Activated Clotting Time: 235 seconds

## 2018-02-06 ENCOUNTER — Other Ambulatory Visit: Payer: Self-pay

## 2018-02-06 DIAGNOSIS — I714 Abdominal aortic aneurysm, without rupture, unspecified: Secondary | ICD-10-CM

## 2018-02-06 DIAGNOSIS — Z48812 Encounter for surgical aftercare following surgery on the circulatory system: Secondary | ICD-10-CM

## 2018-02-21 ENCOUNTER — Other Ambulatory Visit: Payer: Self-pay | Admitting: Surgery

## 2018-03-11 ENCOUNTER — Encounter: Payer: Self-pay | Admitting: Surgery

## 2018-03-11 ENCOUNTER — Other Ambulatory Visit: Payer: Self-pay

## 2018-03-11 ENCOUNTER — Ambulatory Visit
Admission: RE | Admit: 2018-03-11 | Discharge: 2018-03-11 | Disposition: A | Discharge status: Home / Self Care | Payer: Medicare HMO | Source: Ambulatory Visit | Attending: Surgery | Admitting: Surgery

## 2018-03-11 ENCOUNTER — Ambulatory Visit (INDEPENDENT_AMBULATORY_CARE_PROVIDER_SITE_OTHER): Payer: Self-pay | Admitting: Surgery

## 2018-03-11 VITALS — BP 151/73 | HR 54 | Temp 97.3°F | Resp 16 | Ht 67.5 in | Wt 130.0 lb

## 2018-03-11 DIAGNOSIS — I714 Abdominal aortic aneurysm, without rupture, unspecified: Secondary | ICD-10-CM

## 2018-03-11 DIAGNOSIS — Z48812 Encounter for surgical aftercare following surgery on the circulatory system: Secondary | ICD-10-CM

## 2018-03-11 MED ORDER — IOPAMIDOL (ISOVUE-370) INJECTION 76%
80.0000 mL | Freq: Once | INTRAVENOUS | Status: AC | PRN
  Administered 2018-03-11: 80 mL via INTRAVENOUS

## 2018-03-11 NOTE — Progress Notes (Signed)
Patient name: Mark [name] MRN: [id number] DOB: 1950/05/13 Sex: male  REASON FOR VISIT:     post op  HISTORY OF PRESENT ILLNESS:   Mark [name] is a 68 y.o. male who was found to have an asymptomatic 5.5 cm infrarenal abdominal aortic aneurysm.  On 02/01/2018 he underwent endovascular repair.  His postoperative course was uncomplicated.  He is back today for follow-up.  He has no complaints today.   Patient has a history of peripheral vascular disease. He states that he had 5 stents placed many many years ago. He is medically managed for hypertension. He has hypercholesterolemia but is intolerant of statins. He is a current smoker, approximately 2 packs/day. He states that he can walk up about 5 flights of steps before he gets short of breath. He does walk approximately 1/4-1/3 of a mile without difficulty. He does have a history of nonischemic cardiomyopathy, followed by Dr. Leeann Must. He has a history of stroke in 2016. His carotid arteries were normal at that time. 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  CURRENT MEDICATIONS:    Current Outpatient Medications  Medication Sig Dispense Refill  . amLODipine (NORVASC) 10 MG tablet Take 10 mg by mouth daily.    Marland Kitchen amLODipine (NORVASC) 10 MG tablet Take 1 tablet (10 mg total) by mouth daily. 1 tablet 0  . aspirin EC 81 MG EC tablet Take 1 tablet (81 mg total) by mouth daily. 1 tablet 0  . cyclobenzaprine (FLEXERIL) 10 MG tablet Take 10 mg by mouth 3 (three) times daily as needed for muscle spasms.    . hydrALAZINE (APRESOLINE) 25 MG tablet Take 25 mg by mouth 2 (two) times daily.     . hydrALAZINE (APRESOLINE) 25 MG tablet Take 1 tablet (25 mg total) by mouth 2 (two) times daily. 1 tablet 0  . ibuprofen (ADVIL,MOTRIN) 200 MG tablet Take 400 mg by mouth every 6 (six) hours as needed for moderate pain.     Marland Kitchen lisinopril (PRINIVIL,ZESTRIL) 40 MG tablet Take 1 tablet (40  mg total) by mouth daily. 1 tablet 0  . oxyCODONE-acetaminophen (PERCOCET/ROXICET) 5-325 MG tablet Take 1 tablet by mouth every 6 (six) hours as needed for moderate pain. (Patient not taking: Reported on 03/11/2018) 12 tablet 0   No current facility-administered medications for this visit.     REVIEW OF SYSTEMS:   [X]  denotes positive finding, [ ]  denotes negative finding Cardiac  Comments:  Chest pain or chest pressure:    Shortness of breath upon exertion:    Short of breath when lying flat:    Irregular heart rhythm:    Constitutional    Fever or chills:      PHYSICAL EXAM:   Vitals:   03/11/18 1546  BP: (!) 151/73  Pulse: (!) 54  Resp: 16  Temp: (!) 97.3 F (36.3 C)  TempSrc: Oral  SpO2: 98%  Weight: 130 lb (59 kg)  Height: 5' 7.5" (1.715 m)    GENERAL: The patient is a well-nourished male, in no acute distress. The vital signs are documented above. CARDIOVASCULAR: There is a regular rate and rhythm. PULMONARY: Non-labored respirations Incisions clean  STUDIES:   I have reviewed the CT scan with the following findings: VASCULAR  1. Post endovascular repair of infrarenal abdominal aortic aneurysm with slight decrease in size of the native abdominal aortic aneurysm sac, measuring 4.7 cm in maximal diameter, previously, 5.0 cm. No evidence of an endoleak or periaortic stranding.  Aortic aneurysm NOS (ICD10-I71.9). 2. Unchanged minimal amount of intimal thickening involving the bilateral external iliac arterial stents, not resulting in a hemodynamically significant stenosis. 3.  Aortic Atherosclerosis (ICD10-I70.0).  NON-VASCULAR  1.  Emphysema (ICD10-J43.9).   MEDICAL ISSUES:   AAA:  Successful exclusion with EVAR.  F/u 6 months with u/s PAD:  6 mo follow up with u/s  Durene Cal, MD [facility] [phone] Pager [phone]

## 2018-03-18 DIAGNOSIS — I739 Peripheral vascular disease, unspecified: Secondary | ICD-10-CM | POA: Diagnosis not present

## 2018-03-18 DIAGNOSIS — R42 Dizziness and giddiness: Secondary | ICD-10-CM | POA: Diagnosis not present

## 2018-03-18 DIAGNOSIS — N183 Chronic kidney disease, stage 3 (moderate): Secondary | ICD-10-CM | POA: Diagnosis not present

## 2018-03-18 DIAGNOSIS — R69 Illness, unspecified: Secondary | ICD-10-CM | POA: Diagnosis not present

## 2018-04-03 DIAGNOSIS — N183 Chronic kidney disease, stage 3 (moderate): Secondary | ICD-10-CM | POA: Diagnosis not present

## 2018-04-11 DIAGNOSIS — R05 Cough: Secondary | ICD-10-CM | POA: Diagnosis not present

## 2018-04-11 DIAGNOSIS — R69 Illness, unspecified: Secondary | ICD-10-CM | POA: Diagnosis not present

## 2018-04-11 DIAGNOSIS — F4329 Adjustment disorder with other symptoms: Secondary | ICD-10-CM | POA: Diagnosis not present

## 2018-05-01 DIAGNOSIS — R69 Illness, unspecified: Secondary | ICD-10-CM | POA: Diagnosis not present

## 2018-05-10 DIAGNOSIS — K1370 Unspecified lesions of oral mucosa: Secondary | ICD-10-CM | POA: Diagnosis not present

## 2018-05-10 DIAGNOSIS — G4700 Insomnia, unspecified: Secondary | ICD-10-CM | POA: Diagnosis not present

## 2018-05-10 DIAGNOSIS — J441 Chronic obstructive pulmonary disease with (acute) exacerbation: Secondary | ICD-10-CM | POA: Diagnosis not present

## 2018-05-10 DIAGNOSIS — J0140 Acute pansinusitis, unspecified: Secondary | ICD-10-CM | POA: Diagnosis not present

## 2018-07-05 DIAGNOSIS — Z9911 Dependence on respirator [ventilator] status: Secondary | ICD-10-CM | POA: Diagnosis not present

## 2018-07-05 DIAGNOSIS — R1312 Dysphagia, oropharyngeal phase: Secondary | ICD-10-CM | POA: Diagnosis not present

## 2018-07-05 DIAGNOSIS — J988 Other specified respiratory disorders: Secondary | ICD-10-CM | POA: Diagnosis not present

## 2018-07-05 DIAGNOSIS — I619 Nontraumatic intracerebral hemorrhage, unspecified: Secondary | ICD-10-CM | POA: Diagnosis not present

## 2018-07-05 DIAGNOSIS — J9601 Acute respiratory failure with hypoxia: Secondary | ICD-10-CM | POA: Diagnosis not present

## 2018-07-05 DIAGNOSIS — S0083XA Contusion of other part of head, initial encounter: Secondary | ICD-10-CM | POA: Diagnosis not present

## 2018-07-05 DIAGNOSIS — R4182 Altered mental status, unspecified: Secondary | ICD-10-CM | POA: Diagnosis not present

## 2018-07-05 DIAGNOSIS — R404 Transient alteration of awareness: Secondary | ICD-10-CM | POA: Diagnosis not present

## 2018-07-05 DIAGNOSIS — I129 Hypertensive chronic kidney disease with stage 1 through stage 4 chronic kidney disease, or unspecified chronic kidney disease: Secondary | ICD-10-CM | POA: Diagnosis not present

## 2018-07-05 DIAGNOSIS — J9600 Acute respiratory failure, unspecified whether with hypoxia or hypercapnia: Secondary | ICD-10-CM | POA: Diagnosis not present

## 2018-07-05 DIAGNOSIS — I6523 Occlusion and stenosis of bilateral carotid arteries: Secondary | ICD-10-CM | POA: Diagnosis not present

## 2018-07-05 DIAGNOSIS — I639 Cerebral infarction, unspecified: Secondary | ICD-10-CM | POA: Diagnosis not present

## 2018-07-05 DIAGNOSIS — N183 Chronic kidney disease, stage 3 (moderate): Secondary | ICD-10-CM | POA: Diagnosis not present

## 2018-07-05 DIAGNOSIS — D72829 Elevated white blood cell count, unspecified: Secondary | ICD-10-CM | POA: Diagnosis not present

## 2018-07-05 DIAGNOSIS — J90 Pleural effusion, not elsewhere classified: Secondary | ICD-10-CM | POA: Diagnosis not present

## 2018-07-05 DIAGNOSIS — I358 Other nonrheumatic aortic valve disorders: Secondary | ICD-10-CM | POA: Diagnosis not present

## 2018-07-05 DIAGNOSIS — R29818 Other symptoms and signs involving the nervous system: Secondary | ICD-10-CM | POA: Diagnosis not present

## 2018-07-05 DIAGNOSIS — G8101 Flaccid hemiplegia affecting right dominant side: Secondary | ICD-10-CM | POA: Diagnosis not present

## 2018-07-05 DIAGNOSIS — R69 Illness, unspecified: Secondary | ICD-10-CM | POA: Diagnosis not present

## 2018-07-05 DIAGNOSIS — R4781 Slurred speech: Secondary | ICD-10-CM | POA: Diagnosis not present

## 2018-07-05 DIAGNOSIS — Q282 Arteriovenous malformation of cerebral vessels: Secondary | ICD-10-CM | POA: Diagnosis not present

## 2018-07-05 DIAGNOSIS — R569 Unspecified convulsions: Secondary | ICD-10-CM | POA: Diagnosis not present

## 2018-07-05 DIAGNOSIS — E870 Hyperosmolality and hypernatremia: Secondary | ICD-10-CM | POA: Diagnosis not present

## 2018-07-05 DIAGNOSIS — K567 Ileus, unspecified: Secondary | ICD-10-CM | POA: Diagnosis not present

## 2018-07-05 DIAGNOSIS — G9349 Other encephalopathy: Secondary | ICD-10-CM | POA: Diagnosis not present

## 2018-07-05 DIAGNOSIS — Z430 Encounter for attention to tracheostomy: Secondary | ICD-10-CM | POA: Diagnosis not present

## 2018-07-05 DIAGNOSIS — I609 Nontraumatic subarachnoid hemorrhage, unspecified: Secondary | ICD-10-CM | POA: Diagnosis not present

## 2018-07-05 DIAGNOSIS — R29810 Facial weakness: Secondary | ICD-10-CM | POA: Diagnosis not present

## 2018-07-05 DIAGNOSIS — R1310 Dysphagia, unspecified: Secondary | ICD-10-CM | POA: Diagnosis not present

## 2018-07-05 DIAGNOSIS — I610 Nontraumatic intracerebral hemorrhage in hemisphere, subcortical: Secondary | ICD-10-CM | POA: Diagnosis not present

## 2018-07-05 DIAGNOSIS — Z48811 Encounter for surgical aftercare following surgery on the nervous system: Secondary | ICD-10-CM | POA: Diagnosis not present

## 2018-07-05 DIAGNOSIS — J9811 Atelectasis: Secondary | ICD-10-CM | POA: Diagnosis not present

## 2018-07-05 DIAGNOSIS — R531 Weakness: Secondary | ICD-10-CM | POA: Diagnosis not present

## 2018-07-05 DIAGNOSIS — G936 Cerebral edema: Secondary | ICD-10-CM | POA: Diagnosis not present

## 2018-07-05 DIAGNOSIS — G935 Compression of brain: Secondary | ICD-10-CM | POA: Diagnosis not present

## 2018-07-05 DIAGNOSIS — R0602 Shortness of breath: Secondary | ICD-10-CM | POA: Diagnosis not present

## 2018-07-05 DIAGNOSIS — D649 Anemia, unspecified: Secondary | ICD-10-CM | POA: Diagnosis not present

## 2018-07-05 DIAGNOSIS — R4020 Unspecified coma: Secondary | ICD-10-CM | POA: Diagnosis not present

## 2018-07-05 DIAGNOSIS — Z4682 Encounter for fitting and adjustment of non-vascular catheter: Secondary | ICD-10-CM | POA: Diagnosis not present

## 2018-07-05 DIAGNOSIS — E876 Hypokalemia: Secondary | ICD-10-CM | POA: Diagnosis not present

## 2018-07-05 DIAGNOSIS — Z930 Tracheostomy status: Secondary | ICD-10-CM | POA: Diagnosis not present

## 2018-07-05 DIAGNOSIS — R410 Disorientation, unspecified: Secondary | ICD-10-CM | POA: Diagnosis not present

## 2018-07-05 DIAGNOSIS — R069 Unspecified abnormalities of breathing: Secondary | ICD-10-CM | POA: Diagnosis not present

## 2018-07-05 DIAGNOSIS — Q283 Other malformations of cerebral vessels: Secondary | ICD-10-CM | POA: Diagnosis not present

## 2018-07-05 DIAGNOSIS — Z431 Encounter for attention to gastrostomy: Secondary | ICD-10-CM | POA: Diagnosis not present

## 2018-07-05 DIAGNOSIS — R9401 Abnormal electroencephalogram [EEG]: Secondary | ICD-10-CM | POA: Diagnosis not present

## 2018-07-05 DIAGNOSIS — I10 Essential (primary) hypertension: Secondary | ICD-10-CM | POA: Diagnosis not present

## 2018-07-05 DIAGNOSIS — I161 Hypertensive emergency: Secondary | ICD-10-CM | POA: Diagnosis not present

## 2018-07-05 DIAGNOSIS — R140 Abdominal distension (gaseous): Secondary | ICD-10-CM | POA: Diagnosis not present

## 2018-07-05 DIAGNOSIS — R4701 Aphasia: Secondary | ICD-10-CM | POA: Diagnosis not present

## 2018-07-05 DIAGNOSIS — Z9989 Dependence on other enabling machines and devices: Secondary | ICD-10-CM | POA: Diagnosis not present

## 2018-07-05 DIAGNOSIS — I618 Other nontraumatic intracerebral hemorrhage: Secondary | ICD-10-CM | POA: Diagnosis not present

## 2018-07-05 DIAGNOSIS — Z8673 Personal history of transient ischemic attack (TIA), and cerebral infarction without residual deficits: Secondary | ICD-10-CM | POA: Diagnosis not present

## 2018-07-05 DIAGNOSIS — K5900 Constipation, unspecified: Secondary | ICD-10-CM | POA: Diagnosis not present

## 2018-07-05 DIAGNOSIS — E785 Hyperlipidemia, unspecified: Secondary | ICD-10-CM | POA: Diagnosis not present

## 2018-07-05 DIAGNOSIS — J9621 Acute and chronic respiratory failure with hypoxia: Secondary | ICD-10-CM | POA: Diagnosis not present

## 2018-07-05 DIAGNOSIS — R220 Localized swelling, mass and lump, head: Secondary | ICD-10-CM | POA: Diagnosis not present

## 2018-07-05 DIAGNOSIS — I629 Nontraumatic intracranial hemorrhage, unspecified: Secondary | ICD-10-CM | POA: Diagnosis not present

## 2018-07-05 DIAGNOSIS — I714 Abdominal aortic aneurysm, without rupture: Secondary | ICD-10-CM | POA: Diagnosis not present

## 2018-07-05 DIAGNOSIS — G9340 Encephalopathy, unspecified: Secondary | ICD-10-CM | POA: Diagnosis not present

## 2018-07-05 DIAGNOSIS — R918 Other nonspecific abnormal finding of lung field: Secondary | ICD-10-CM | POA: Diagnosis not present

## 2018-07-05 DIAGNOSIS — I519 Heart disease, unspecified: Secondary | ICD-10-CM | POA: Diagnosis not present

## 2018-07-05 DIAGNOSIS — E46 Unspecified protein-calorie malnutrition: Secondary | ICD-10-CM | POA: Diagnosis not present

## 2018-07-06 DIAGNOSIS — G935 Compression of brain: Secondary | ICD-10-CM | POA: Diagnosis not present

## 2018-07-06 DIAGNOSIS — J9601 Acute respiratory failure with hypoxia: Secondary | ICD-10-CM | POA: Diagnosis not present

## 2018-07-06 DIAGNOSIS — Q283 Other malformations of cerebral vessels: Secondary | ICD-10-CM | POA: Diagnosis not present

## 2018-07-06 DIAGNOSIS — G936 Cerebral edema: Secondary | ICD-10-CM | POA: Diagnosis not present

## 2018-07-06 DIAGNOSIS — I619 Nontraumatic intracerebral hemorrhage, unspecified: Secondary | ICD-10-CM | POA: Diagnosis not present

## 2018-07-06 DIAGNOSIS — I161 Hypertensive emergency: Secondary | ICD-10-CM | POA: Diagnosis not present

## 2018-07-06 DIAGNOSIS — Z4682 Encounter for fitting and adjustment of non-vascular catheter: Secondary | ICD-10-CM | POA: Diagnosis not present

## 2018-07-07 DIAGNOSIS — J9601 Acute respiratory failure with hypoxia: Secondary | ICD-10-CM | POA: Diagnosis not present

## 2018-07-07 DIAGNOSIS — G935 Compression of brain: Secondary | ICD-10-CM | POA: Diagnosis not present

## 2018-07-07 DIAGNOSIS — I609 Nontraumatic subarachnoid hemorrhage, unspecified: Secondary | ICD-10-CM | POA: Diagnosis not present

## 2018-07-07 DIAGNOSIS — Q283 Other malformations of cerebral vessels: Secondary | ICD-10-CM | POA: Diagnosis not present

## 2018-07-07 DIAGNOSIS — Z9911 Dependence on respirator [ventilator] status: Secondary | ICD-10-CM | POA: Diagnosis not present

## 2018-07-07 DIAGNOSIS — R220 Localized swelling, mass and lump, head: Secondary | ICD-10-CM | POA: Diagnosis not present

## 2018-07-07 DIAGNOSIS — I619 Nontraumatic intracerebral hemorrhage, unspecified: Secondary | ICD-10-CM | POA: Diagnosis not present

## 2018-07-07 DIAGNOSIS — G936 Cerebral edema: Secondary | ICD-10-CM | POA: Diagnosis not present

## 2018-07-07 DIAGNOSIS — I161 Hypertensive emergency: Secondary | ICD-10-CM | POA: Diagnosis not present

## 2018-07-08 DIAGNOSIS — Q282 Arteriovenous malformation of cerebral vessels: Secondary | ICD-10-CM | POA: Diagnosis not present

## 2018-07-08 DIAGNOSIS — I618 Other nontraumatic intracerebral hemorrhage: Secondary | ICD-10-CM | POA: Diagnosis not present

## 2018-07-08 DIAGNOSIS — R4701 Aphasia: Secondary | ICD-10-CM | POA: Diagnosis not present

## 2018-07-08 DIAGNOSIS — I619 Nontraumatic intracerebral hemorrhage, unspecified: Secondary | ICD-10-CM | POA: Diagnosis not present

## 2018-07-08 DIAGNOSIS — I6523 Occlusion and stenosis of bilateral carotid arteries: Secondary | ICD-10-CM | POA: Diagnosis not present

## 2018-07-08 DIAGNOSIS — I610 Nontraumatic intracerebral hemorrhage in hemisphere, subcortical: Secondary | ICD-10-CM | POA: Diagnosis not present

## 2018-07-08 DIAGNOSIS — G8101 Flaccid hemiplegia affecting right dominant side: Secondary | ICD-10-CM | POA: Diagnosis not present

## 2018-07-08 DIAGNOSIS — R569 Unspecified convulsions: Secondary | ICD-10-CM | POA: Diagnosis not present

## 2018-07-08 DIAGNOSIS — K567 Ileus, unspecified: Secondary | ICD-10-CM | POA: Diagnosis not present

## 2018-07-08 DIAGNOSIS — G935 Compression of brain: Secondary | ICD-10-CM | POA: Diagnosis not present

## 2018-07-08 DIAGNOSIS — G936 Cerebral edema: Secondary | ICD-10-CM | POA: Diagnosis not present

## 2018-07-08 DIAGNOSIS — E870 Hyperosmolality and hypernatremia: Secondary | ICD-10-CM | POA: Diagnosis not present

## 2018-07-08 DIAGNOSIS — G9340 Encephalopathy, unspecified: Secondary | ICD-10-CM | POA: Diagnosis not present

## 2018-07-08 DIAGNOSIS — Q283 Other malformations of cerebral vessels: Secondary | ICD-10-CM | POA: Diagnosis not present

## 2018-07-08 DIAGNOSIS — I10 Essential (primary) hypertension: Secondary | ICD-10-CM | POA: Diagnosis not present

## 2018-07-08 DIAGNOSIS — J9601 Acute respiratory failure with hypoxia: Secondary | ICD-10-CM | POA: Diagnosis not present

## 2018-07-08 DIAGNOSIS — I161 Hypertensive emergency: Secondary | ICD-10-CM | POA: Diagnosis not present

## 2018-07-08 DIAGNOSIS — R9401 Abnormal electroencephalogram [EEG]: Secondary | ICD-10-CM | POA: Diagnosis not present

## 2018-07-08 DIAGNOSIS — Z9911 Dependence on respirator [ventilator] status: Secondary | ICD-10-CM | POA: Diagnosis not present

## 2018-07-09 DIAGNOSIS — R220 Localized swelling, mass and lump, head: Secondary | ICD-10-CM | POA: Diagnosis not present

## 2018-07-09 DIAGNOSIS — I10 Essential (primary) hypertension: Secondary | ICD-10-CM | POA: Diagnosis not present

## 2018-07-09 DIAGNOSIS — I609 Nontraumatic subarachnoid hemorrhage, unspecified: Secondary | ICD-10-CM | POA: Diagnosis not present

## 2018-07-09 DIAGNOSIS — G935 Compression of brain: Secondary | ICD-10-CM | POA: Diagnosis not present

## 2018-07-09 DIAGNOSIS — Q283 Other malformations of cerebral vessels: Secondary | ICD-10-CM | POA: Diagnosis not present

## 2018-07-09 DIAGNOSIS — Z9911 Dependence on respirator [ventilator] status: Secondary | ICD-10-CM | POA: Diagnosis not present

## 2018-07-09 DIAGNOSIS — G936 Cerebral edema: Secondary | ICD-10-CM | POA: Diagnosis not present

## 2018-07-09 DIAGNOSIS — Z4682 Encounter for fitting and adjustment of non-vascular catheter: Secondary | ICD-10-CM | POA: Diagnosis not present

## 2018-07-09 DIAGNOSIS — I161 Hypertensive emergency: Secondary | ICD-10-CM | POA: Diagnosis not present

## 2018-07-09 DIAGNOSIS — I619 Nontraumatic intracerebral hemorrhage, unspecified: Secondary | ICD-10-CM | POA: Diagnosis not present

## 2018-07-09 DIAGNOSIS — J9601 Acute respiratory failure with hypoxia: Secondary | ICD-10-CM | POA: Diagnosis not present

## 2018-07-09 DIAGNOSIS — I610 Nontraumatic intracerebral hemorrhage in hemisphere, subcortical: Secondary | ICD-10-CM | POA: Diagnosis not present

## 2018-07-10 DIAGNOSIS — Z9911 Dependence on respirator [ventilator] status: Secondary | ICD-10-CM | POA: Diagnosis not present

## 2018-07-10 DIAGNOSIS — G935 Compression of brain: Secondary | ICD-10-CM | POA: Diagnosis not present

## 2018-07-10 DIAGNOSIS — I619 Nontraumatic intracerebral hemorrhage, unspecified: Secondary | ICD-10-CM | POA: Diagnosis not present

## 2018-07-10 DIAGNOSIS — J9601 Acute respiratory failure with hypoxia: Secondary | ICD-10-CM | POA: Diagnosis not present

## 2018-07-10 DIAGNOSIS — G936 Cerebral edema: Secondary | ICD-10-CM | POA: Diagnosis not present

## 2018-07-10 DIAGNOSIS — I161 Hypertensive emergency: Secondary | ICD-10-CM | POA: Diagnosis not present

## 2018-07-10 DIAGNOSIS — R918 Other nonspecific abnormal finding of lung field: Secondary | ICD-10-CM | POA: Diagnosis not present

## 2018-07-10 DIAGNOSIS — I610 Nontraumatic intracerebral hemorrhage in hemisphere, subcortical: Secondary | ICD-10-CM | POA: Diagnosis not present

## 2018-07-10 DIAGNOSIS — I10 Essential (primary) hypertension: Secondary | ICD-10-CM | POA: Diagnosis not present

## 2018-07-10 DIAGNOSIS — Q283 Other malformations of cerebral vessels: Secondary | ICD-10-CM | POA: Diagnosis not present

## 2018-07-11 DIAGNOSIS — I10 Essential (primary) hypertension: Secondary | ICD-10-CM | POA: Diagnosis not present

## 2018-07-11 DIAGNOSIS — I161 Hypertensive emergency: Secondary | ICD-10-CM | POA: Diagnosis not present

## 2018-07-11 DIAGNOSIS — G935 Compression of brain: Secondary | ICD-10-CM | POA: Diagnosis not present

## 2018-07-11 DIAGNOSIS — J90 Pleural effusion, not elsewhere classified: Secondary | ICD-10-CM | POA: Diagnosis not present

## 2018-07-11 DIAGNOSIS — I619 Nontraumatic intracerebral hemorrhage, unspecified: Secondary | ICD-10-CM | POA: Diagnosis not present

## 2018-07-11 DIAGNOSIS — G9340 Encephalopathy, unspecified: Secondary | ICD-10-CM | POA: Diagnosis not present

## 2018-07-11 DIAGNOSIS — I610 Nontraumatic intracerebral hemorrhage in hemisphere, subcortical: Secondary | ICD-10-CM | POA: Diagnosis not present

## 2018-07-11 DIAGNOSIS — Z9911 Dependence on respirator [ventilator] status: Secondary | ICD-10-CM | POA: Diagnosis not present

## 2018-07-11 DIAGNOSIS — G936 Cerebral edema: Secondary | ICD-10-CM | POA: Diagnosis not present

## 2018-07-11 DIAGNOSIS — J9601 Acute respiratory failure with hypoxia: Secondary | ICD-10-CM | POA: Diagnosis not present

## 2018-07-11 DIAGNOSIS — R918 Other nonspecific abnormal finding of lung field: Secondary | ICD-10-CM | POA: Diagnosis not present

## 2018-07-11 DIAGNOSIS — Q283 Other malformations of cerebral vessels: Secondary | ICD-10-CM | POA: Diagnosis not present

## 2018-07-12 DIAGNOSIS — I161 Hypertensive emergency: Secondary | ICD-10-CM | POA: Diagnosis not present

## 2018-07-12 DIAGNOSIS — Q283 Other malformations of cerebral vessels: Secondary | ICD-10-CM | POA: Diagnosis not present

## 2018-07-12 DIAGNOSIS — I10 Essential (primary) hypertension: Secondary | ICD-10-CM | POA: Diagnosis not present

## 2018-07-12 DIAGNOSIS — Z9911 Dependence on respirator [ventilator] status: Secondary | ICD-10-CM | POA: Diagnosis not present

## 2018-07-12 DIAGNOSIS — J9601 Acute respiratory failure with hypoxia: Secondary | ICD-10-CM | POA: Diagnosis not present

## 2018-07-12 DIAGNOSIS — R918 Other nonspecific abnormal finding of lung field: Secondary | ICD-10-CM | POA: Diagnosis not present

## 2018-07-12 DIAGNOSIS — I610 Nontraumatic intracerebral hemorrhage in hemisphere, subcortical: Secondary | ICD-10-CM | POA: Diagnosis not present

## 2018-07-12 DIAGNOSIS — I619 Nontraumatic intracerebral hemorrhage, unspecified: Secondary | ICD-10-CM | POA: Diagnosis not present

## 2018-07-12 DIAGNOSIS — G935 Compression of brain: Secondary | ICD-10-CM | POA: Diagnosis not present

## 2018-07-12 DIAGNOSIS — G936 Cerebral edema: Secondary | ICD-10-CM | POA: Diagnosis not present

## 2018-07-13 DIAGNOSIS — G935 Compression of brain: Secondary | ICD-10-CM | POA: Diagnosis not present

## 2018-07-13 DIAGNOSIS — R918 Other nonspecific abnormal finding of lung field: Secondary | ICD-10-CM | POA: Diagnosis not present

## 2018-07-13 DIAGNOSIS — I619 Nontraumatic intracerebral hemorrhage, unspecified: Secondary | ICD-10-CM | POA: Diagnosis not present

## 2018-07-13 DIAGNOSIS — I161 Hypertensive emergency: Secondary | ICD-10-CM | POA: Diagnosis not present

## 2018-07-13 DIAGNOSIS — I10 Essential (primary) hypertension: Secondary | ICD-10-CM | POA: Diagnosis not present

## 2018-07-13 DIAGNOSIS — J9601 Acute respiratory failure with hypoxia: Secondary | ICD-10-CM | POA: Diagnosis not present

## 2018-07-13 DIAGNOSIS — I610 Nontraumatic intracerebral hemorrhage in hemisphere, subcortical: Secondary | ICD-10-CM | POA: Diagnosis not present

## 2018-07-13 DIAGNOSIS — G936 Cerebral edema: Secondary | ICD-10-CM | POA: Diagnosis not present

## 2018-07-13 DIAGNOSIS — Z9911 Dependence on respirator [ventilator] status: Secondary | ICD-10-CM | POA: Diagnosis not present

## 2018-07-13 DIAGNOSIS — Q283 Other malformations of cerebral vessels: Secondary | ICD-10-CM | POA: Diagnosis not present

## 2018-07-14 DIAGNOSIS — I610 Nontraumatic intracerebral hemorrhage in hemisphere, subcortical: Secondary | ICD-10-CM | POA: Diagnosis not present

## 2018-07-14 DIAGNOSIS — I10 Essential (primary) hypertension: Secondary | ICD-10-CM | POA: Diagnosis not present

## 2018-07-14 DIAGNOSIS — G935 Compression of brain: Secondary | ICD-10-CM | POA: Diagnosis not present

## 2018-07-14 DIAGNOSIS — Q283 Other malformations of cerebral vessels: Secondary | ICD-10-CM | POA: Diagnosis not present

## 2018-07-14 DIAGNOSIS — J9601 Acute respiratory failure with hypoxia: Secondary | ICD-10-CM | POA: Diagnosis not present

## 2018-07-14 DIAGNOSIS — I619 Nontraumatic intracerebral hemorrhage, unspecified: Secondary | ICD-10-CM | POA: Diagnosis not present

## 2018-07-14 DIAGNOSIS — G936 Cerebral edema: Secondary | ICD-10-CM | POA: Diagnosis not present

## 2018-07-14 DIAGNOSIS — Z9911 Dependence on respirator [ventilator] status: Secondary | ICD-10-CM | POA: Diagnosis not present

## 2018-07-14 DIAGNOSIS — I161 Hypertensive emergency: Secondary | ICD-10-CM | POA: Diagnosis not present

## 2018-07-15 DIAGNOSIS — R1310 Dysphagia, unspecified: Secondary | ICD-10-CM | POA: Diagnosis not present

## 2018-07-15 DIAGNOSIS — Z4682 Encounter for fitting and adjustment of non-vascular catheter: Secondary | ICD-10-CM | POA: Diagnosis not present

## 2018-07-15 DIAGNOSIS — R918 Other nonspecific abnormal finding of lung field: Secondary | ICD-10-CM | POA: Diagnosis not present

## 2018-07-15 DIAGNOSIS — I610 Nontraumatic intracerebral hemorrhage in hemisphere, subcortical: Secondary | ICD-10-CM | POA: Diagnosis not present

## 2018-07-15 DIAGNOSIS — J9600 Acute respiratory failure, unspecified whether with hypoxia or hypercapnia: Secondary | ICD-10-CM | POA: Diagnosis not present

## 2018-07-16 DIAGNOSIS — Z4682 Encounter for fitting and adjustment of non-vascular catheter: Secondary | ICD-10-CM | POA: Diagnosis not present

## 2018-07-16 DIAGNOSIS — R1310 Dysphagia, unspecified: Secondary | ICD-10-CM | POA: Diagnosis not present

## 2018-07-16 DIAGNOSIS — I610 Nontraumatic intracerebral hemorrhage in hemisphere, subcortical: Secondary | ICD-10-CM | POA: Diagnosis not present

## 2018-07-17 DIAGNOSIS — R918 Other nonspecific abnormal finding of lung field: Secondary | ICD-10-CM | POA: Diagnosis not present

## 2018-07-17 DIAGNOSIS — G8101 Flaccid hemiplegia affecting right dominant side: Secondary | ICD-10-CM | POA: Diagnosis not present

## 2018-07-17 DIAGNOSIS — I161 Hypertensive emergency: Secondary | ICD-10-CM | POA: Diagnosis not present

## 2018-07-17 DIAGNOSIS — J9601 Acute respiratory failure with hypoxia: Secondary | ICD-10-CM | POA: Diagnosis not present

## 2018-07-17 DIAGNOSIS — D649 Anemia, unspecified: Secondary | ICD-10-CM | POA: Diagnosis not present

## 2018-07-17 DIAGNOSIS — R4701 Aphasia: Secondary | ICD-10-CM | POA: Diagnosis not present

## 2018-07-17 DIAGNOSIS — G936 Cerebral edema: Secondary | ICD-10-CM | POA: Diagnosis not present

## 2018-07-17 DIAGNOSIS — I610 Nontraumatic intracerebral hemorrhage in hemisphere, subcortical: Secondary | ICD-10-CM | POA: Diagnosis not present

## 2018-07-17 DIAGNOSIS — I618 Other nontraumatic intracerebral hemorrhage: Secondary | ICD-10-CM | POA: Diagnosis not present

## 2018-07-17 DIAGNOSIS — G935 Compression of brain: Secondary | ICD-10-CM | POA: Diagnosis not present

## 2018-07-17 DIAGNOSIS — Z4682 Encounter for fitting and adjustment of non-vascular catheter: Secondary | ICD-10-CM | POA: Diagnosis not present

## 2018-07-17 DIAGNOSIS — D72829 Elevated white blood cell count, unspecified: Secondary | ICD-10-CM | POA: Diagnosis not present

## 2018-07-17 DIAGNOSIS — R1310 Dysphagia, unspecified: Secondary | ICD-10-CM | POA: Diagnosis not present

## 2018-07-17 DIAGNOSIS — Z9911 Dependence on respirator [ventilator] status: Secondary | ICD-10-CM | POA: Diagnosis not present

## 2018-07-17 DIAGNOSIS — K567 Ileus, unspecified: Secondary | ICD-10-CM | POA: Diagnosis not present

## 2018-07-17 DIAGNOSIS — E870 Hyperosmolality and hypernatremia: Secondary | ICD-10-CM | POA: Diagnosis not present

## 2018-07-17 DIAGNOSIS — J988 Other specified respiratory disorders: Secondary | ICD-10-CM | POA: Diagnosis not present

## 2018-07-18 DIAGNOSIS — J9601 Acute respiratory failure with hypoxia: Secondary | ICD-10-CM | POA: Diagnosis not present

## 2018-07-18 DIAGNOSIS — Z4682 Encounter for fitting and adjustment of non-vascular catheter: Secondary | ICD-10-CM | POA: Diagnosis not present

## 2018-07-18 DIAGNOSIS — I161 Hypertensive emergency: Secondary | ICD-10-CM | POA: Diagnosis not present

## 2018-07-18 DIAGNOSIS — I610 Nontraumatic intracerebral hemorrhage in hemisphere, subcortical: Secondary | ICD-10-CM | POA: Diagnosis not present

## 2018-07-18 DIAGNOSIS — I618 Other nontraumatic intracerebral hemorrhage: Secondary | ICD-10-CM | POA: Diagnosis not present

## 2018-07-18 DIAGNOSIS — R4701 Aphasia: Secondary | ICD-10-CM | POA: Diagnosis not present

## 2018-07-18 DIAGNOSIS — R918 Other nonspecific abnormal finding of lung field: Secondary | ICD-10-CM | POA: Diagnosis not present

## 2018-07-18 DIAGNOSIS — D72829 Elevated white blood cell count, unspecified: Secondary | ICD-10-CM | POA: Diagnosis not present

## 2018-07-18 DIAGNOSIS — Z48811 Encounter for surgical aftercare following surgery on the nervous system: Secondary | ICD-10-CM | POA: Diagnosis not present

## 2018-07-18 DIAGNOSIS — D649 Anemia, unspecified: Secondary | ICD-10-CM | POA: Diagnosis not present

## 2018-07-18 DIAGNOSIS — Q282 Arteriovenous malformation of cerebral vessels: Secondary | ICD-10-CM | POA: Diagnosis not present

## 2018-07-18 DIAGNOSIS — K567 Ileus, unspecified: Secondary | ICD-10-CM | POA: Diagnosis not present

## 2018-07-18 DIAGNOSIS — G935 Compression of brain: Secondary | ICD-10-CM | POA: Diagnosis not present

## 2018-07-18 DIAGNOSIS — G8101 Flaccid hemiplegia affecting right dominant side: Secondary | ICD-10-CM | POA: Diagnosis not present

## 2018-07-18 DIAGNOSIS — G936 Cerebral edema: Secondary | ICD-10-CM | POA: Diagnosis not present

## 2018-07-18 DIAGNOSIS — E870 Hyperosmolality and hypernatremia: Secondary | ICD-10-CM | POA: Diagnosis not present

## 2018-07-18 DIAGNOSIS — Z9911 Dependence on respirator [ventilator] status: Secondary | ICD-10-CM | POA: Diagnosis not present

## 2018-07-19 DIAGNOSIS — D649 Anemia, unspecified: Secondary | ICD-10-CM | POA: Diagnosis not present

## 2018-07-19 DIAGNOSIS — I610 Nontraumatic intracerebral hemorrhage in hemisphere, subcortical: Secondary | ICD-10-CM | POA: Diagnosis not present

## 2018-07-19 DIAGNOSIS — J9601 Acute respiratory failure with hypoxia: Secondary | ICD-10-CM | POA: Diagnosis not present

## 2018-07-19 DIAGNOSIS — G935 Compression of brain: Secondary | ICD-10-CM | POA: Diagnosis not present

## 2018-07-19 DIAGNOSIS — I10 Essential (primary) hypertension: Secondary | ICD-10-CM | POA: Diagnosis not present

## 2018-07-19 DIAGNOSIS — Q283 Other malformations of cerebral vessels: Secondary | ICD-10-CM | POA: Diagnosis not present

## 2018-07-19 DIAGNOSIS — R1310 Dysphagia, unspecified: Secondary | ICD-10-CM | POA: Diagnosis not present

## 2018-07-19 DIAGNOSIS — R918 Other nonspecific abnormal finding of lung field: Secondary | ICD-10-CM | POA: Diagnosis not present

## 2018-07-19 DIAGNOSIS — I619 Nontraumatic intracerebral hemorrhage, unspecified: Secondary | ICD-10-CM | POA: Diagnosis not present

## 2018-07-19 DIAGNOSIS — Z4682 Encounter for fitting and adjustment of non-vascular catheter: Secondary | ICD-10-CM | POA: Diagnosis not present

## 2018-07-19 DIAGNOSIS — N183 Chronic kidney disease, stage 3 (moderate): Secondary | ICD-10-CM | POA: Diagnosis not present

## 2018-07-20 DIAGNOSIS — Z9911 Dependence on respirator [ventilator] status: Secondary | ICD-10-CM | POA: Diagnosis not present

## 2018-07-20 DIAGNOSIS — R1310 Dysphagia, unspecified: Secondary | ICD-10-CM | POA: Diagnosis not present

## 2018-07-20 DIAGNOSIS — E870 Hyperosmolality and hypernatremia: Secondary | ICD-10-CM | POA: Diagnosis not present

## 2018-07-20 DIAGNOSIS — J9601 Acute respiratory failure with hypoxia: Secondary | ICD-10-CM | POA: Diagnosis not present

## 2018-07-20 DIAGNOSIS — R918 Other nonspecific abnormal finding of lung field: Secondary | ICD-10-CM | POA: Diagnosis not present

## 2018-07-20 DIAGNOSIS — I610 Nontraumatic intracerebral hemorrhage in hemisphere, subcortical: Secondary | ICD-10-CM | POA: Diagnosis not present

## 2018-07-20 DIAGNOSIS — Z4682 Encounter for fitting and adjustment of non-vascular catheter: Secondary | ICD-10-CM | POA: Diagnosis not present

## 2018-07-20 DIAGNOSIS — I618 Other nontraumatic intracerebral hemorrhage: Secondary | ICD-10-CM | POA: Diagnosis not present

## 2018-07-20 DIAGNOSIS — Q283 Other malformations of cerebral vessels: Secondary | ICD-10-CM | POA: Diagnosis not present

## 2018-07-20 DIAGNOSIS — K567 Ileus, unspecified: Secondary | ICD-10-CM | POA: Diagnosis not present

## 2018-07-20 DIAGNOSIS — G8101 Flaccid hemiplegia affecting right dominant side: Secondary | ICD-10-CM | POA: Diagnosis not present

## 2018-07-20 DIAGNOSIS — R1312 Dysphagia, oropharyngeal phase: Secondary | ICD-10-CM | POA: Diagnosis not present

## 2018-07-20 DIAGNOSIS — R4701 Aphasia: Secondary | ICD-10-CM | POA: Diagnosis not present

## 2018-07-20 DIAGNOSIS — I161 Hypertensive emergency: Secondary | ICD-10-CM | POA: Diagnosis not present

## 2018-07-20 DIAGNOSIS — G936 Cerebral edema: Secondary | ICD-10-CM | POA: Diagnosis not present

## 2018-07-20 DIAGNOSIS — G935 Compression of brain: Secondary | ICD-10-CM | POA: Diagnosis not present

## 2018-07-21 DIAGNOSIS — R140 Abdominal distension (gaseous): Secondary | ICD-10-CM | POA: Diagnosis not present

## 2018-07-21 DIAGNOSIS — I610 Nontraumatic intracerebral hemorrhage in hemisphere, subcortical: Secondary | ICD-10-CM | POA: Diagnosis not present

## 2018-07-21 DIAGNOSIS — R1310 Dysphagia, unspecified: Secondary | ICD-10-CM | POA: Diagnosis not present

## 2018-07-21 DIAGNOSIS — J9811 Atelectasis: Secondary | ICD-10-CM | POA: Diagnosis not present

## 2018-07-21 DIAGNOSIS — Z4682 Encounter for fitting and adjustment of non-vascular catheter: Secondary | ICD-10-CM | POA: Diagnosis not present

## 2018-07-22 DIAGNOSIS — D649 Anemia, unspecified: Secondary | ICD-10-CM | POA: Diagnosis not present

## 2018-07-22 DIAGNOSIS — J90 Pleural effusion, not elsewhere classified: Secondary | ICD-10-CM | POA: Diagnosis not present

## 2018-07-22 DIAGNOSIS — Q282 Arteriovenous malformation of cerebral vessels: Secondary | ICD-10-CM | POA: Diagnosis not present

## 2018-07-22 DIAGNOSIS — N183 Chronic kidney disease, stage 3 (moderate): Secondary | ICD-10-CM | POA: Diagnosis not present

## 2018-07-22 DIAGNOSIS — I618 Other nontraumatic intracerebral hemorrhage: Secondary | ICD-10-CM | POA: Diagnosis not present

## 2018-07-22 DIAGNOSIS — J9601 Acute respiratory failure with hypoxia: Secondary | ICD-10-CM | POA: Diagnosis not present

## 2018-07-22 DIAGNOSIS — I610 Nontraumatic intracerebral hemorrhage in hemisphere, subcortical: Secondary | ICD-10-CM | POA: Diagnosis not present

## 2018-07-22 DIAGNOSIS — E870 Hyperosmolality and hypernatremia: Secondary | ICD-10-CM | POA: Diagnosis not present

## 2018-07-22 DIAGNOSIS — Z4682 Encounter for fitting and adjustment of non-vascular catheter: Secondary | ICD-10-CM | POA: Diagnosis not present

## 2018-07-22 DIAGNOSIS — I10 Essential (primary) hypertension: Secondary | ICD-10-CM | POA: Diagnosis not present

## 2018-07-22 DIAGNOSIS — R69 Illness, unspecified: Secondary | ICD-10-CM | POA: Diagnosis not present

## 2018-07-22 DIAGNOSIS — G936 Cerebral edema: Secondary | ICD-10-CM | POA: Diagnosis not present

## 2018-07-22 DIAGNOSIS — R1310 Dysphagia, unspecified: Secondary | ICD-10-CM | POA: Diagnosis not present

## 2018-07-23 DIAGNOSIS — G935 Compression of brain: Secondary | ICD-10-CM | POA: Diagnosis not present

## 2018-07-23 DIAGNOSIS — G8101 Flaccid hemiplegia affecting right dominant side: Secondary | ICD-10-CM | POA: Diagnosis not present

## 2018-07-23 DIAGNOSIS — I610 Nontraumatic intracerebral hemorrhage in hemisphere, subcortical: Secondary | ICD-10-CM | POA: Diagnosis not present

## 2018-07-23 DIAGNOSIS — R4701 Aphasia: Secondary | ICD-10-CM | POA: Diagnosis not present

## 2018-07-23 DIAGNOSIS — I618 Other nontraumatic intracerebral hemorrhage: Secondary | ICD-10-CM | POA: Diagnosis not present

## 2018-07-23 DIAGNOSIS — K567 Ileus, unspecified: Secondary | ICD-10-CM | POA: Diagnosis not present

## 2018-07-23 DIAGNOSIS — I619 Nontraumatic intracerebral hemorrhage, unspecified: Secondary | ICD-10-CM | POA: Diagnosis not present

## 2018-07-23 DIAGNOSIS — Z9911 Dependence on respirator [ventilator] status: Secondary | ICD-10-CM | POA: Diagnosis not present

## 2018-07-23 DIAGNOSIS — J9601 Acute respiratory failure with hypoxia: Secondary | ICD-10-CM | POA: Diagnosis not present

## 2018-07-23 DIAGNOSIS — I161 Hypertensive emergency: Secondary | ICD-10-CM | POA: Diagnosis not present

## 2018-07-23 DIAGNOSIS — R69 Illness, unspecified: Secondary | ICD-10-CM | POA: Diagnosis not present

## 2018-07-23 DIAGNOSIS — G936 Cerebral edema: Secondary | ICD-10-CM | POA: Diagnosis not present

## 2018-07-23 DIAGNOSIS — E870 Hyperosmolality and hypernatremia: Secondary | ICD-10-CM | POA: Diagnosis not present

## 2018-07-23 DIAGNOSIS — Q283 Other malformations of cerebral vessels: Secondary | ICD-10-CM | POA: Diagnosis not present

## 2018-07-23 DIAGNOSIS — Q282 Arteriovenous malformation of cerebral vessels: Secondary | ICD-10-CM | POA: Diagnosis not present

## 2018-07-24 DIAGNOSIS — I619 Nontraumatic intracerebral hemorrhage, unspecified: Secondary | ICD-10-CM | POA: Diagnosis not present

## 2018-07-24 DIAGNOSIS — K567 Ileus, unspecified: Secondary | ICD-10-CM | POA: Diagnosis not present

## 2018-07-24 DIAGNOSIS — J9601 Acute respiratory failure with hypoxia: Secondary | ICD-10-CM | POA: Diagnosis not present

## 2018-07-24 DIAGNOSIS — I10 Essential (primary) hypertension: Secondary | ICD-10-CM | POA: Diagnosis not present

## 2018-07-24 DIAGNOSIS — R69 Illness, unspecified: Secondary | ICD-10-CM | POA: Diagnosis not present

## 2018-07-24 DIAGNOSIS — Q283 Other malformations of cerebral vessels: Secondary | ICD-10-CM | POA: Diagnosis not present

## 2018-07-25 DIAGNOSIS — J9601 Acute respiratory failure with hypoxia: Secondary | ICD-10-CM | POA: Diagnosis not present

## 2018-07-25 DIAGNOSIS — Q283 Other malformations of cerebral vessels: Secondary | ICD-10-CM | POA: Diagnosis not present

## 2018-07-25 DIAGNOSIS — R69 Illness, unspecified: Secondary | ICD-10-CM | POA: Diagnosis not present

## 2018-07-25 DIAGNOSIS — I619 Nontraumatic intracerebral hemorrhage, unspecified: Secondary | ICD-10-CM | POA: Diagnosis not present

## 2018-07-25 DIAGNOSIS — R140 Abdominal distension (gaseous): Secondary | ICD-10-CM | POA: Diagnosis not present

## 2018-07-25 DIAGNOSIS — K567 Ileus, unspecified: Secondary | ICD-10-CM | POA: Diagnosis not present

## 2018-07-26 DIAGNOSIS — R918 Other nonspecific abnormal finding of lung field: Secondary | ICD-10-CM | POA: Diagnosis not present

## 2018-07-26 DIAGNOSIS — Q282 Arteriovenous malformation of cerebral vessels: Secondary | ICD-10-CM | POA: Diagnosis not present

## 2018-07-26 DIAGNOSIS — E870 Hyperosmolality and hypernatremia: Secondary | ICD-10-CM | POA: Diagnosis not present

## 2018-07-26 DIAGNOSIS — R1310 Dysphagia, unspecified: Secondary | ICD-10-CM | POA: Diagnosis not present

## 2018-07-26 DIAGNOSIS — G936 Cerebral edema: Secondary | ICD-10-CM | POA: Diagnosis not present

## 2018-07-26 DIAGNOSIS — R69 Illness, unspecified: Secondary | ICD-10-CM | POA: Diagnosis not present

## 2018-07-26 DIAGNOSIS — I10 Essential (primary) hypertension: Secondary | ICD-10-CM | POA: Diagnosis not present

## 2018-07-26 DIAGNOSIS — I610 Nontraumatic intracerebral hemorrhage in hemisphere, subcortical: Secondary | ICD-10-CM | POA: Diagnosis not present

## 2018-07-26 DIAGNOSIS — D649 Anemia, unspecified: Secondary | ICD-10-CM | POA: Diagnosis not present

## 2018-07-26 DIAGNOSIS — Z9911 Dependence on respirator [ventilator] status: Secondary | ICD-10-CM | POA: Diagnosis not present

## 2018-07-26 DIAGNOSIS — J9601 Acute respiratory failure with hypoxia: Secondary | ICD-10-CM | POA: Diagnosis not present

## 2018-07-26 DIAGNOSIS — N183 Chronic kidney disease, stage 3 (moderate): Secondary | ICD-10-CM | POA: Diagnosis not present

## 2018-07-27 DIAGNOSIS — R69 Illness, unspecified: Secondary | ICD-10-CM | POA: Diagnosis not present

## 2018-07-27 DIAGNOSIS — J90 Pleural effusion, not elsewhere classified: Secondary | ICD-10-CM | POA: Diagnosis not present

## 2018-07-27 DIAGNOSIS — I619 Nontraumatic intracerebral hemorrhage, unspecified: Secondary | ICD-10-CM | POA: Diagnosis not present

## 2018-07-27 DIAGNOSIS — K567 Ileus, unspecified: Secondary | ICD-10-CM | POA: Diagnosis not present

## 2018-07-27 DIAGNOSIS — R918 Other nonspecific abnormal finding of lung field: Secondary | ICD-10-CM | POA: Diagnosis not present

## 2018-07-27 DIAGNOSIS — Z9911 Dependence on respirator [ventilator] status: Secondary | ICD-10-CM | POA: Diagnosis not present

## 2018-07-27 DIAGNOSIS — J9601 Acute respiratory failure with hypoxia: Secondary | ICD-10-CM | POA: Diagnosis not present

## 2018-07-27 DIAGNOSIS — Q283 Other malformations of cerebral vessels: Secondary | ICD-10-CM | POA: Diagnosis not present

## 2018-07-28 DIAGNOSIS — R69 Illness, unspecified: Secondary | ICD-10-CM | POA: Diagnosis not present

## 2018-07-28 DIAGNOSIS — I619 Nontraumatic intracerebral hemorrhage, unspecified: Secondary | ICD-10-CM | POA: Diagnosis not present

## 2018-07-28 DIAGNOSIS — Q283 Other malformations of cerebral vessels: Secondary | ICD-10-CM | POA: Diagnosis not present

## 2018-07-28 DIAGNOSIS — J9601 Acute respiratory failure with hypoxia: Secondary | ICD-10-CM | POA: Diagnosis not present

## 2018-07-28 DIAGNOSIS — K567 Ileus, unspecified: Secondary | ICD-10-CM | POA: Diagnosis not present

## 2018-07-28 DIAGNOSIS — K5900 Constipation, unspecified: Secondary | ICD-10-CM | POA: Diagnosis not present

## 2018-07-29 DIAGNOSIS — R1310 Dysphagia, unspecified: Secondary | ICD-10-CM | POA: Diagnosis not present

## 2018-07-29 DIAGNOSIS — Q283 Other malformations of cerebral vessels: Secondary | ICD-10-CM | POA: Diagnosis not present

## 2018-07-29 DIAGNOSIS — J90 Pleural effusion, not elsewhere classified: Secondary | ICD-10-CM | POA: Diagnosis not present

## 2018-07-29 DIAGNOSIS — I619 Nontraumatic intracerebral hemorrhage, unspecified: Secondary | ICD-10-CM | POA: Diagnosis not present

## 2018-07-29 DIAGNOSIS — Z9911 Dependence on respirator [ventilator] status: Secondary | ICD-10-CM | POA: Diagnosis not present

## 2018-07-29 DIAGNOSIS — R918 Other nonspecific abnormal finding of lung field: Secondary | ICD-10-CM | POA: Diagnosis not present

## 2018-07-29 DIAGNOSIS — E876 Hypokalemia: Secondary | ICD-10-CM | POA: Diagnosis not present

## 2018-07-29 DIAGNOSIS — I10 Essential (primary) hypertension: Secondary | ICD-10-CM | POA: Diagnosis not present

## 2018-07-29 DIAGNOSIS — J9601 Acute respiratory failure with hypoxia: Secondary | ICD-10-CM | POA: Diagnosis not present

## 2018-07-29 DIAGNOSIS — N183 Chronic kidney disease, stage 3 (moderate): Secondary | ICD-10-CM | POA: Diagnosis not present

## 2018-07-29 DIAGNOSIS — D649 Anemia, unspecified: Secondary | ICD-10-CM | POA: Diagnosis not present

## 2018-07-30 DIAGNOSIS — J9601 Acute respiratory failure with hypoxia: Secondary | ICD-10-CM | POA: Diagnosis not present

## 2018-07-30 DIAGNOSIS — I10 Essential (primary) hypertension: Secondary | ICD-10-CM | POA: Diagnosis not present

## 2018-07-30 DIAGNOSIS — Z4682 Encounter for fitting and adjustment of non-vascular catheter: Secondary | ICD-10-CM | POA: Diagnosis not present

## 2018-07-30 DIAGNOSIS — R1310 Dysphagia, unspecified: Secondary | ICD-10-CM | POA: Diagnosis not present

## 2018-07-30 DIAGNOSIS — E876 Hypokalemia: Secondary | ICD-10-CM | POA: Diagnosis not present

## 2018-07-30 DIAGNOSIS — R918 Other nonspecific abnormal finding of lung field: Secondary | ICD-10-CM | POA: Diagnosis not present

## 2018-07-30 DIAGNOSIS — I619 Nontraumatic intracerebral hemorrhage, unspecified: Secondary | ICD-10-CM | POA: Diagnosis not present

## 2018-07-30 DIAGNOSIS — D649 Anemia, unspecified: Secondary | ICD-10-CM | POA: Diagnosis not present

## 2018-07-30 DIAGNOSIS — Q283 Other malformations of cerebral vessels: Secondary | ICD-10-CM | POA: Diagnosis not present

## 2018-07-30 DIAGNOSIS — N183 Chronic kidney disease, stage 3 (moderate): Secondary | ICD-10-CM | POA: Diagnosis not present

## 2018-07-31 DIAGNOSIS — Q283 Other malformations of cerebral vessels: Secondary | ICD-10-CM | POA: Diagnosis not present

## 2018-07-31 DIAGNOSIS — I10 Essential (primary) hypertension: Secondary | ICD-10-CM | POA: Diagnosis not present

## 2018-07-31 DIAGNOSIS — I619 Nontraumatic intracerebral hemorrhage, unspecified: Secondary | ICD-10-CM | POA: Diagnosis not present

## 2018-07-31 DIAGNOSIS — R918 Other nonspecific abnormal finding of lung field: Secondary | ICD-10-CM | POA: Diagnosis not present

## 2018-07-31 DIAGNOSIS — Z9911 Dependence on respirator [ventilator] status: Secondary | ICD-10-CM | POA: Diagnosis not present

## 2018-07-31 DIAGNOSIS — E876 Hypokalemia: Secondary | ICD-10-CM | POA: Diagnosis not present

## 2018-07-31 DIAGNOSIS — J90 Pleural effusion, not elsewhere classified: Secondary | ICD-10-CM | POA: Diagnosis not present

## 2018-07-31 DIAGNOSIS — N183 Chronic kidney disease, stage 3 (moderate): Secondary | ICD-10-CM | POA: Diagnosis not present

## 2018-07-31 DIAGNOSIS — J9601 Acute respiratory failure with hypoxia: Secondary | ICD-10-CM | POA: Diagnosis not present

## 2018-07-31 DIAGNOSIS — R1310 Dysphagia, unspecified: Secondary | ICD-10-CM | POA: Diagnosis not present

## 2018-07-31 DIAGNOSIS — D649 Anemia, unspecified: Secondary | ICD-10-CM | POA: Diagnosis not present

## 2018-08-01 DIAGNOSIS — R1310 Dysphagia, unspecified: Secondary | ICD-10-CM | POA: Diagnosis not present

## 2018-08-01 DIAGNOSIS — D649 Anemia, unspecified: Secondary | ICD-10-CM | POA: Diagnosis not present

## 2018-08-01 DIAGNOSIS — N183 Chronic kidney disease, stage 3 (moderate): Secondary | ICD-10-CM | POA: Diagnosis not present

## 2018-08-01 DIAGNOSIS — Q283 Other malformations of cerebral vessels: Secondary | ICD-10-CM | POA: Diagnosis not present

## 2018-08-01 DIAGNOSIS — J9601 Acute respiratory failure with hypoxia: Secondary | ICD-10-CM | POA: Diagnosis not present

## 2018-08-01 DIAGNOSIS — I10 Essential (primary) hypertension: Secondary | ICD-10-CM | POA: Diagnosis not present

## 2018-08-01 DIAGNOSIS — I619 Nontraumatic intracerebral hemorrhage, unspecified: Secondary | ICD-10-CM | POA: Diagnosis not present

## 2018-08-02 ENCOUNTER — Other Ambulatory Visit (HOSPITAL_COMMUNITY): Payer: Self-pay

## 2018-08-02 ENCOUNTER — Inpatient Hospital Stay
Admission: AD | Admit: 2018-08-02 | Discharge: 2018-08-21 | Disposition: A | Discharge status: Rehabilitation Facility | Payer: Medicare HMO | Source: Other Acute Inpatient Hospital | Attending: Internal Medicine | Admitting: Internal Medicine

## 2018-08-02 DIAGNOSIS — D649 Anemia, unspecified: Secondary | ICD-10-CM | POA: Diagnosis not present

## 2018-08-02 DIAGNOSIS — E119 Type 2 diabetes mellitus without complications: Secondary | ICD-10-CM | POA: Diagnosis not present

## 2018-08-02 DIAGNOSIS — Z9911 Dependence on respirator [ventilator] status: Secondary | ICD-10-CM | POA: Diagnosis not present

## 2018-08-02 DIAGNOSIS — Z96612 Presence of left artificial shoulder joint: Secondary | ICD-10-CM | POA: Diagnosis not present

## 2018-08-02 DIAGNOSIS — I509 Heart failure, unspecified: Secondary | ICD-10-CM

## 2018-08-02 DIAGNOSIS — N183 Chronic kidney disease, stage 3 unspecified: Secondary | ICD-10-CM | POA: Diagnosis present

## 2018-08-02 DIAGNOSIS — R4701 Aphasia: Secondary | ICD-10-CM | POA: Diagnosis not present

## 2018-08-02 DIAGNOSIS — I129 Hypertensive chronic kidney disease with stage 1 through stage 4 chronic kidney disease, or unspecified chronic kidney disease: Secondary | ICD-10-CM | POA: Diagnosis not present

## 2018-08-02 DIAGNOSIS — Z471 Aftercare following joint replacement surgery: Secondary | ICD-10-CM | POA: Diagnosis not present

## 2018-08-02 DIAGNOSIS — E1151 Type 2 diabetes mellitus with diabetic peripheral angiopathy without gangrene: Secondary | ICD-10-CM | POA: Diagnosis not present

## 2018-08-02 DIAGNOSIS — D62 Acute posthemorrhagic anemia: Secondary | ICD-10-CM | POA: Diagnosis not present

## 2018-08-02 DIAGNOSIS — I611 Nontraumatic intracerebral hemorrhage in hemisphere, cortical: Secondary | ICD-10-CM | POA: Diagnosis not present

## 2018-08-02 DIAGNOSIS — I629 Nontraumatic intracranial hemorrhage, unspecified: Secondary | ICD-10-CM

## 2018-08-02 DIAGNOSIS — I10 Essential (primary) hypertension: Secondary | ICD-10-CM | POA: Diagnosis not present

## 2018-08-02 DIAGNOSIS — N189 Chronic kidney disease, unspecified: Secondary | ICD-10-CM | POA: Diagnosis not present

## 2018-08-02 DIAGNOSIS — E785 Hyperlipidemia, unspecified: Secondary | ICD-10-CM | POA: Diagnosis not present

## 2018-08-02 DIAGNOSIS — E46 Unspecified protein-calorie malnutrition: Secondary | ICD-10-CM | POA: Diagnosis not present

## 2018-08-02 DIAGNOSIS — E1122 Type 2 diabetes mellitus with diabetic chronic kidney disease: Secondary | ICD-10-CM | POA: Diagnosis not present

## 2018-08-02 DIAGNOSIS — R69 Illness, unspecified: Secondary | ICD-10-CM | POA: Diagnosis not present

## 2018-08-02 DIAGNOSIS — J9621 Acute and chronic respiratory failure with hypoxia: Secondary | ICD-10-CM | POA: Diagnosis not present

## 2018-08-02 DIAGNOSIS — R1310 Dysphagia, unspecified: Secondary | ICD-10-CM | POA: Diagnosis not present

## 2018-08-02 DIAGNOSIS — I69119 Unspecified symptoms and signs involving cognitive functions following nontraumatic intracerebral hemorrhage: Secondary | ICD-10-CM | POA: Diagnosis not present

## 2018-08-02 DIAGNOSIS — J9601 Acute respiratory failure with hypoxia: Secondary | ICD-10-CM | POA: Diagnosis not present

## 2018-08-02 DIAGNOSIS — Z430 Encounter for attention to tracheostomy: Secondary | ICD-10-CM | POA: Diagnosis not present

## 2018-08-02 DIAGNOSIS — I69120 Aphasia following nontraumatic intracerebral hemorrhage: Secondary | ICD-10-CM | POA: Diagnosis not present

## 2018-08-02 DIAGNOSIS — J9600 Acute respiratory failure, unspecified whether with hypoxia or hypercapnia: Secondary | ICD-10-CM | POA: Diagnosis not present

## 2018-08-02 DIAGNOSIS — I610 Nontraumatic intracerebral hemorrhage in hemisphere, subcortical: Secondary | ICD-10-CM | POA: Diagnosis not present

## 2018-08-02 DIAGNOSIS — Z931 Gastrostomy status: Secondary | ICD-10-CM | POA: Diagnosis not present

## 2018-08-02 DIAGNOSIS — I619 Nontraumatic intracerebral hemorrhage, unspecified: Secondary | ICD-10-CM | POA: Diagnosis not present

## 2018-08-02 DIAGNOSIS — G9349 Other encephalopathy: Secondary | ICD-10-CM | POA: Diagnosis not present

## 2018-08-02 DIAGNOSIS — R410 Disorientation, unspecified: Secondary | ICD-10-CM | POA: Diagnosis not present

## 2018-08-02 DIAGNOSIS — R4182 Altered mental status, unspecified: Secondary | ICD-10-CM | POA: Diagnosis present

## 2018-08-02 DIAGNOSIS — Z431 Encounter for attention to gastrostomy: Secondary | ICD-10-CM | POA: Diagnosis not present

## 2018-08-02 DIAGNOSIS — D631 Anemia in chronic kidney disease: Secondary | ICD-10-CM | POA: Diagnosis not present

## 2018-08-02 DIAGNOSIS — R1312 Dysphagia, oropharyngeal phase: Secondary | ICD-10-CM | POA: Diagnosis not present

## 2018-08-02 DIAGNOSIS — E876 Hypokalemia: Secondary | ICD-10-CM | POA: Diagnosis not present

## 2018-08-02 DIAGNOSIS — I69151 Hemiplegia and hemiparesis following nontraumatic intracerebral hemorrhage affecting right dominant side: Secondary | ICD-10-CM | POA: Diagnosis not present

## 2018-08-02 DIAGNOSIS — Q282 Arteriovenous malformation of cerebral vessels: Secondary | ICD-10-CM | POA: Diagnosis not present

## 2018-08-02 DIAGNOSIS — R531 Weakness: Secondary | ICD-10-CM | POA: Diagnosis not present

## 2018-08-02 DIAGNOSIS — Z930 Tracheostomy status: Secondary | ICD-10-CM | POA: Diagnosis not present

## 2018-08-02 HISTORY — DX: Acute and chronic respiratory failure with hypoxia: J96.21

## 2018-08-02 HISTORY — DX: Nontraumatic intracranial hemorrhage, unspecified: I62.9

## 2018-08-02 HISTORY — DX: Chronic kidney disease, stage 3 unspecified: N18.30

## 2018-08-02 HISTORY — DX: Altered mental status, unspecified: R41.82

## 2018-08-02 LAB — BLOOD GAS, ARTERIAL
Acid-Base Excess: 8.2 mmol/L — ABNORMAL HIGH (ref 0.0–2.0)
Bicarbonate: 31.5 mmol/L — ABNORMAL HIGH (ref 20.0–28.0)
FIO2: 40
O2 Saturation: 99.6 %
PEEP: 5 cmH2O
Patient temperature: 97.8
Pressure control: 14 cmH2O
RATE: 14 resp/min
pCO2 arterial: 37.5 mmHg (ref 32.0–48.0)
pH, Arterial: 7.532 — ABNORMAL HIGH (ref 7.350–7.450)
pO2, Arterial: 167 mmHg — ABNORMAL HIGH (ref 83.0–108.0)

## 2018-08-03 LAB — BLOOD GAS, ARTERIAL
Acid-Base Excess: 8 mmol/L — ABNORMAL HIGH (ref 0.0–2.0)
Acid-Base Excess: 7.4 mmol/L — ABNORMAL HIGH (ref 0.0–2.0)
Bicarbonate: 31.3 mmol/L — ABNORMAL HIGH (ref 20.0–28.0)
Bicarbonate: 30.2 mmol/L — ABNORMAL HIGH (ref 20.0–28.0)
Delivery systems: POSITIVE
Delivery systems: POSITIVE
Expiratory PAP: 5
Expiratory PAP: 5
FIO2: 0.28
FIO2: 0.28
Inspiratory PAP: 12
Inspiratory PAP: 12
O2 Saturation: 62.4 %
O2 Saturation: 98.7 %
Patient temperature: 98.6
Patient temperature: 98.6
RATE: 19 resp/min
pCO2 arterial: 38.4 mmHg (ref 32.0–48.0)
pCO2 arterial: 33 mmHg (ref 32.0–48.0)
pH, Arterial: 7.522 — ABNORMAL HIGH (ref 7.350–7.450)
pH, Arterial: 7.569 — ABNORMAL HIGH (ref 7.350–7.450)
pO2, Arterial: 31.2 mmHg — CL (ref 83.0–108.0)
pO2, Arterial: 99.7 mmHg (ref 83.0–108.0)

## 2018-08-03 LAB — CBC WITH DIFFERENTIAL/PLATELET
Abs Immature Granulocytes: 0.04 10*3/uL (ref 0.00–0.07)
Basophils Absolute: 0.1 10*3/uL (ref 0.0–0.1)
Basophils Relative: 1 %
Eosinophils Absolute: 0.1 10*3/uL (ref 0.0–0.5)
Eosinophils Relative: 1 %
HCT: 27.3 % — ABNORMAL LOW (ref 39.0–52.0)
Hemoglobin: 8.7 g/dL — ABNORMAL LOW (ref 13.0–17.0)
Immature Granulocytes: 1 %
Lymphocytes Relative: 20 %
Lymphs Abs: 1.7 10*3/uL (ref 0.7–4.0)
MCH: 27.9 pg (ref 26.0–34.0)
MCHC: 31.9 g/dL (ref 30.0–36.0)
MCV: 87.5 fL (ref 80.0–100.0)
Monocytes Absolute: 1.6 10*3/uL — ABNORMAL HIGH (ref 0.1–1.0)
Monocytes Relative: 18 %
Neutro Abs: 5.2 10*3/uL (ref 1.7–7.7)
Neutrophils Relative %: 59 %
Platelets: 442 10*3/uL — ABNORMAL HIGH (ref 150–400)
RBC: 3.12 MIL/uL — ABNORMAL LOW (ref 4.22–5.81)
RDW: 15.5 % (ref 11.5–15.5)
WBC: 8.7 10*3/uL (ref 4.0–10.5)
nRBC: 0 % (ref 0.0–0.2)

## 2018-08-03 LAB — COMPREHENSIVE METABOLIC PANEL
ALT: 29 U/L (ref 0–44)
AST: 27 U/L (ref 15–41)
Albumin: 2.4 g/dL — ABNORMAL LOW (ref 3.5–5.0)
Alkaline Phosphatase: 84 U/L (ref 38–126)
Anion gap: 12 (ref 5–15)
BUN: 11 mg/dL (ref 8–23)
CO2: 27 mmol/L (ref 22–32)
Calcium: 8.7 mg/dL — ABNORMAL LOW (ref 8.9–10.3)
Chloride: 101 mmol/L (ref 98–111)
Creatinine, Ser: 0.85 mg/dL (ref 0.61–1.24)
GFR calc Af Amer: >60 mL/min (ref >60)
GFR calc non Af Amer: >60 mL/min (ref >60)
Glucose, Bld: 103 mg/dL — ABNORMAL HIGH (ref 70–99)
Potassium: 3.3 mmol/L — ABNORMAL LOW (ref 3.5–5.1)
Sodium: 140 mmol/L (ref 135–145)
Total Bilirubin: 0.7 mg/dL (ref 0.3–1.2)
Total Protein: 6.4 g/dL — ABNORMAL LOW (ref 6.5–8.1)

## 2018-08-03 LAB — PHOSPHORUS: Phosphorus: 3.1 mg/dL (ref 2.5–4.6)

## 2018-08-03 LAB — PROTIME-INR
INR: 1.1 (ref 0.8–1.2)
Prothrombin Time: 14.4 seconds (ref 11.4–15.2)

## 2018-08-03 LAB — MAGNESIUM: Magnesium: 1.9 mg/dL (ref 1.7–2.4)

## 2018-08-04 LAB — CULTURE, RESPIRATORY W GRAM STAIN

## 2018-08-04 LAB — CULTURE, RESPIRATORY

## 2018-08-04 LAB — BLOOD GAS, ARTERIAL

## 2018-08-04 LAB — POTASSIUM: Potassium: 3.2 mmol/L — ABNORMAL LOW (ref 3.5–5.1)

## 2018-08-05 DIAGNOSIS — J9621 Acute and chronic respiratory failure with hypoxia: Secondary | ICD-10-CM

## 2018-08-05 DIAGNOSIS — N183 Chronic kidney disease, stage 3 (moderate): Secondary | ICD-10-CM

## 2018-08-05 DIAGNOSIS — I629 Nontraumatic intracranial hemorrhage, unspecified: Secondary | ICD-10-CM

## 2018-08-05 DIAGNOSIS — R4182 Altered mental status, unspecified: Secondary | ICD-10-CM

## 2018-08-05 LAB — BLOOD GAS, ARTERIAL
Acid-Base Excess: 6.6 mmol/L — ABNORMAL HIGH (ref 0.0–2.0)
Bicarbonate: 30 mmol/L — ABNORMAL HIGH (ref 20.0–28.0)
FIO2: 0.28
O2 Saturation: 97.2 %
PEEP: 5 cmH2O
Patient temperature: 98.6
Pressure control: 12 cmH2O
RATE: 10 resp/min
pCO2 arterial: 37.8 mmHg (ref 32.0–48.0)
pH, Arterial: 7.51 — ABNORMAL HIGH (ref 7.350–7.450)
pO2, Arterial: 82.7 mmHg — ABNORMAL LOW (ref 83.0–108.0)

## 2018-08-05 LAB — BASIC METABOLIC PANEL
Anion gap: 10 (ref 5–15)
BUN: 10 mg/dL (ref 8–23)
CO2: 28 mmol/L (ref 22–32)
Calcium: 8.8 mg/dL — ABNORMAL LOW (ref 8.9–10.3)
Chloride: 98 mmol/L (ref 98–111)
Creatinine, Ser: 0.96 mg/dL (ref 0.61–1.24)
GFR calc Af Amer: >60 mL/min (ref >60)
GFR calc non Af Amer: >60 mL/min (ref >60)
Glucose, Bld: 167 mg/dL — ABNORMAL HIGH (ref 70–99)
Potassium: 3.2 mmol/L — ABNORMAL LOW (ref 3.5–5.1)
Sodium: 136 mmol/L (ref 135–145)

## 2018-08-05 LAB — MAGNESIUM: Magnesium: 2 mg/dL (ref 1.7–2.4)

## 2018-08-05 MED ORDER — HYDRALAZINE HCL 50 MG PO TABS
100.00 | ORAL_TABLET | ORAL | Status: DC
Start: 2018-08-02 — End: 2018-08-05

## 2018-08-05 MED ORDER — ACETAMINOPHEN 325 MG PO TABS
650.00 | ORAL_TABLET | ORAL | Status: DC
Start: ? — End: 2018-08-05

## 2018-08-05 MED ORDER — LABETALOL HCL 5 MG/ML IV SOLN
10.00 | INTRAVENOUS | Status: DC
Start: ? — End: 2018-08-05

## 2018-08-05 MED ORDER — ACETAMINOPHEN 650 MG RE SUPP
650.00 | RECTAL | Status: DC
Start: ? — End: 2018-08-05

## 2018-08-05 MED ORDER — CHLORHEXIDINE GLUCONATE 0.12 % MT SOLN
15.00 | OROMUCOSAL | Status: DC
Start: 2018-08-02 — End: 2018-08-05

## 2018-08-05 MED ORDER — LISINOPRIL 20 MG PO TABS
20.00 | ORAL_TABLET | ORAL | Status: DC
Start: 2018-08-02 — End: 2018-08-05

## 2018-08-05 MED ORDER — INSULIN LISPRO 100 UNIT/ML ~~LOC~~ SOLN
1.00 | SUBCUTANEOUS | Status: DC
Start: 2018-08-02 — End: 2018-08-05

## 2018-08-05 MED ORDER — HEPARIN SODIUM (PORCINE) 5000 UNIT/ML IJ SOLN
5000.00 | INTRAMUSCULAR | Status: DC
Start: 2018-08-02 — End: 2018-08-05

## 2018-08-05 MED ORDER — HYDRALAZINE HCL 20 MG/ML IJ SOLN
10.00 | INTRAMUSCULAR | Status: DC
Start: ? — End: 2018-08-05

## 2018-08-05 MED ORDER — INSULIN GLARGINE 100 UNIT/ML ~~LOC~~ SOLN
1.00 | SUBCUTANEOUS | Status: DC
Start: ? — End: 2018-08-05

## 2018-08-05 MED ORDER — SODIUM CHLORIDE 0.9 % IV SOLN
10.00 | INTRAVENOUS | Status: DC
Start: ? — End: 2018-08-05

## 2018-08-05 MED ORDER — AMANTADINE HCL 100 MG PO CAPS
100.00 | ORAL_CAPSULE | ORAL | Status: DC
Start: 2018-08-02 — End: 2018-08-05

## 2018-08-05 MED ORDER — INSULIN GLARGINE 100 UNIT/ML ~~LOC~~ SOLN
1.00 | SUBCUTANEOUS | Status: DC
Start: 2018-08-02 — End: 2018-08-05

## 2018-08-05 MED ORDER — FIRST-LANSOPRAZOLE 3 MG/ML PO SUSP
30.00 | ORAL | Status: DC
Start: 2018-08-03 — End: 2018-08-05

## 2018-08-05 MED ORDER — FENTANYL CITRATE (PF) 2500 MCG/50ML IJ SOLN
25.00 | INTRAMUSCULAR | Status: DC
Start: ? — End: 2018-08-05

## 2018-08-05 MED ORDER — GENERIC EXTERNAL MEDICATION
Status: DC
Start: ? — End: 2018-08-05

## 2018-08-05 MED ORDER — AMLODIPINE BESYLATE 10 MG PO TABS
10.00 | ORAL_TABLET | ORAL | Status: DC
Start: 2018-08-03 — End: 2018-08-05

## 2018-08-05 MED ORDER — INSULIN LISPRO 100 UNIT/ML ~~LOC~~ SOLN
1.00 | SUBCUTANEOUS | Status: DC
Start: ? — End: 2018-08-05

## 2018-08-05 MED ORDER — LEVETIRACETAM 100 MG/ML PO SOLN
500.00 | ORAL | Status: DC
Start: 2018-08-02 — End: 2018-08-05

## 2018-08-05 MED ORDER — ONDANSETRON HCL 4 MG/2ML IJ SOLN
4.00 | INTRAMUSCULAR | Status: DC
Start: ? — End: 2018-08-05

## 2018-08-05 NOTE — Consult Note (Signed)
Pulmonary Critical Care Medicine [facility] [place]  PULMONARY SERVICE  Date of Service: 08/05/2018  PULMONARY CRITICAL CARE CONSULT   Mark [name]  [id number]:[id number]  DOB: 07-02-1950   DOA: 08/02/2018  Referring Physician: Carron Curie, MD  HPI: Mark [name] is a 68 y.o. male seen for follow up of Acute on Chronic Respiratory Failure.  Patient with multiple medical problems including aortic aneurysm hypertension history of stroke tobacco abuse vascular peripheral vascular disease.  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  Patient underwent left hemi-craniectomy hematoma evacuation and AVM resection.  Patient had embolization done of the AVM and cranioplasty.  Patient however did not come off the ventilator and required prolonged mechanical ventilation underwent tracheostomy and a PEG tube placement for prolonged mechanical ventilation.  The patient has had no seizures but is on seizure medication.  The patient was transferred to our facility for further management and weaning.  Review of Systems:  ROS performed and is unremarkable other than noted above.  Past Medical History:  Diagnosis Date  . Chronic kidney disease    CKD stage 3  . Concussion    multiple in high school  . Coronary artery disease    pt denies  . Diabetes mellitus without complication (HCC)    borderline  . Hypertension   . PVD (peripheral vascular disease) (HCC)    leg stents femoral artery  . Stroke [facility])    3 years ago- no residual    Past Surgical History:  Procedure Laterality Date  . ABDOMINAL AORTIC ENDOVASCULAR STENT GRAFT N/A 02/01/2018   Procedure: ABDOMINAL AORTIC ENDOVASCULAR STENT GRAFT;  Surgeon: Nada Libman, MD;  Location: [facility] OR;  Service: Vascular;  Laterality: N/A;  . NECK SURGERY    . VEIN BYPASS SURGERY      Social History:    reports that he has been smoking cigarettes. He has been  smoking about 1.50 packs per day. He has never used smokeless tobacco. He reports that he does not drink alcohol or use drugs.  Family History: Non-Contributory to the present illness  Allergies  Allergen Reactions  . Orajel Mouth-Aid [Carbamide Peroxide] Swelling    Swelling  . Pravastatin Other (See Comments)    Pain & weakness  . Rosuvastatin     UNSPECIFIED REACTION   . Sudafed [Pseudoephedrine] Other (See Comments)    Pt couldn't sleep    Medications: Reviewed on Rounds  Physical Exam:  Vitals: Temperature 98.0 pulse 79 respiratory 18 blood pressure 169/75 saturations 99%  Ventilator Settings mode ventilation pressure support FiO2 28% pressure 12 PEEP 5 tidal volume 500  . General: Comfortable at this time . Eyes: Grossly normal lids, irises & conjunctiva . ENT: grossly tongue is normal . Neck: no obvious mass . Cardiovascular: S1-S2 normal no gallop or rub . Respiratory: No rhonchi or rales are noted . Abdomen: Soft and nontender . Skin: no rash seen on limited exam . Musculoskeletal: not rigid . Psychiatric:unable to assess . Neurologic: no seizure no involuntary movements         Labs on Admission:  Basic Metabolic Panel: Recent Labs  Lab 08/03/18 0457 08/04/18 0733  NA 140  --   K 3.3* 3.2*  CL 101  --   CO2 27  --   GLUCOSE 103*  --   BUN 11  --   CREATININE 0.85  --   CALCIUM 8.7*  --   MG 1.9  --  PHOS 3.1  --     Recent Labs  Lab 08/02/18 1405 08/03/18 0550 08/03/18 0612 08/04/18 0600 08/05/18 0610  PHART 7.532* 7.522* 7.569* 7.578* 7.510*  PCO2ART 37.5 38.4 33.0 30.7* 37.8  PO2ART 167* 31.2* 99.7 123* 82.7*  HCO3 31.5* 31.3* 30.2* 28.7* 30.0*  O2SAT 99.6 62.4 98.7 99.4 97.2    Liver Function Tests: Recent Labs  Lab 08/03/18 0457  AST 27  ALT 29  ALKPHOS 84  BILITOT 0.7  PROT 6.4*  ALBUMIN 2.4*   No results for input(s): LIPASE, AMYLASE in the last 168 hours. No results for input(s): AMMONIA in the last 168  hours.  CBC: Recent Labs  Lab 08/03/18 0457  WBC 8.7  NEUTROABS 5.2  HGB 8.7*  HCT 27.3*  MCV 87.5  PLT 442*    Cardiac Enzymes: No results for input(s): CKTOTAL, CKMB, CKMBINDEX, TROPONINI in the last 168 hours.  BNP (last 3 results) No results for input(s): BNP in the last 8760 hours.  ProBNP (last 3 results) No results for input(s): PROBNP in the last 8760 hours.   Radiological Exams on Admission: Dg Abd 1 View  Result Date: 08/02/2018 CLINICAL DATA:  Status post percutaneous endoscopic gastrostomy tube placement. Ventilator dependent. EXAM: ABDOMEN - 1 VIEW COMPARISON:  None. FINDINGS: Contrast injection, Omnipaque 300, 50 mL, via the PEG demonstrates opacification of the stomach. There is no definite extraluminal extension. A radiopaque instrument projects over the LEFT paramedian spine. Patient is status post aortobifemoral stent graft. Nonobstructive gas pattern. IMPRESSION: Apparent satisfactory PEG tube placement, with intraluminal contrast (Omnipaque) Electronically Signed   By: Staci Righter M.D.   On: 08/02/2018 17:05   Dg Chest Port 1 View  Result Date: 08/02/2018 CLINICAL DATA:  Ventilator dependent. Status post PEG placement. EXAM: PORTABLE CHEST 1 VIEW COMPARISON:  02/01/2018. FINDINGS: Tracheostomy good position. Cardiomegaly. Increased perihilar markings, could represent infiltrates or edema. These appear worse on the RIGHT but this could be due to overlying soft tissue. IMPRESSION: Increased perihilar markings, could represent infiltrates or edema. Continued surveillance is warranted. Worsening aeration from priors. Electronically Signed   By: Staci Righter M.D.   On: 08/02/2018 17:02    Assessment/Plan Active Problems:   Acute on chronic respiratory failure with hypoxia (HCC)   Chronic kidney disease, stage III (moderate) (HCC)   Nontraumatic intracranial hemorrhage, unspecified (HCC)   Altered mental status, unspecified   1. Acute on chronic respiratory  failure with hypoxia continue to titrate oxygen down as tolerated.  The last chest x-ray reveals consistency more with pulmonary edema. 2. Nontraumatic left-sided intracranial hemorrhage status post evacuation and cranioplasty.  We will continue to monitor and continue with supportive care. 3. Chronic kidney disease stage III follow the urinary output right now is stable we will continue to monitor. 4. Acute mental status changes patient appears to be at baseline right now medications are being adjusted as per the primary care team.  I have personally seen and evaluated the patient, evaluated laboratory and imaging results, formulated the assessment and plan and placed orders. The Patient requires high complexity decision making for assessment and support.  Case was discussed on Rounds with the Respiratory Therapy Staff Time Spent 65minutes  Allyne Gee, MD [facility] Pulmonary Critical Care Medicine Sleep Medicine

## 2018-08-06 LAB — POTASSIUM: Potassium: 3.4 mmol/L — ABNORMAL LOW (ref 3.5–5.1)

## 2018-08-06 NOTE — Progress Notes (Signed)
Pulmonary Critical Care Medicine [facility] [place]   PULMONARY CRITICAL CARE SERVICE  PROGRESS NOTE  Date of Service: 08/06/2018  Mark [name]  [id number]:[id number]  DOB: Nov 07, 1950   DOA: 08/02/2018  Referring Physician: Carron Curie, MD  HPI: Mark [name] is a 68 y.o. male seen for follow up of Acute on Chronic Respiratory Failure.  Patient is on pressure support mode has very copious secretions noted right now is on 28% FiO2  Medications: Reviewed on Rounds  Physical Exam:  Vitals: Temperature 97.2 pulse 61 respiratory rate is 12 blood pressure is 120/71 saturations 99%  Ventilator Settings mode ventilation pressure support FiO2 reveals 28% pressure 12 PEEP 5 tidal volume 626  . General: Comfortable at this time . Eyes: Grossly normal lids, irises & conjunctiva . ENT: grossly tongue is normal . Neck: no obvious mass . Cardiovascular: S1 S2 normal no gallop . Respiratory: No rhonchi or rales are noted at this time . Abdomen: soft . Skin: no rash seen on limited exam . Musculoskeletal: not rigid . Psychiatric:unable to assess . Neurologic: no seizure no involuntary movements         Lab Data:   Basic Metabolic Panel: Recent Labs  Lab 08/03/18 0457 08/04/18 0733 08/05/18 1002 08/06/18 0523  NA 140  --  136  --   K 3.3* 3.2* 3.2* 3.4*  CL 101  --  98  --   CO2 27  --  28  --   GLUCOSE 103*  --  167*  --   BUN 11  --  10  --   CREATININE 0.85  --  0.96  --   CALCIUM 8.7*  --  8.8*  --   MG 1.9  --  2.0  --   PHOS 3.1  --   --   --     ABG: Recent Labs  Lab 08/02/18 1405 08/03/18 0550 08/03/18 0612 08/04/18 0600 08/05/18 0610  PHART 7.532* 7.522* 7.569* 7.578* 7.510*  PCO2ART 37.5 38.4 33.0 30.7* 37.8  PO2ART 167* 31.2* 99.7 123* 82.7*  HCO3 31.5* 31.3* 30.2* 28.7* 30.0*  O2SAT 99.6 62.4 98.7 99.4 97.2    Liver Function Tests: Recent Labs  Lab 08/03/18 0457  AST 27  ALT 29  ALKPHOS 84  BILITOT 0.7  PROT 6.4*  ALBUMIN 2.4*    No results for input(s): LIPASE, AMYLASE in the last 168 hours. No results for input(s): AMMONIA in the last 168 hours.  CBC: Recent Labs  Lab 08/03/18 0457  WBC 8.7  NEUTROABS 5.2  HGB 8.7*  HCT 27.3*  MCV 87.5  PLT 442*    Cardiac Enzymes: No results for input(s): CKTOTAL, CKMB, CKMBINDEX, TROPONINI in the last 168 hours.  BNP (last 3 results) No results for input(s): BNP in the last 8760 hours.  ProBNP (last 3 results) No results for input(s): PROBNP in the last 8760 hours.  Radiological Exams: No results found.  Assessment/Plan Active Problems:   Acute on chronic respiratory failure with hypoxia (HCC)   Chronic kidney disease, stage III (moderate) (HCC)   Nontraumatic intracranial hemorrhage, unspecified (HCC)   Altered mental status, unspecified   1. Acute on chronic respiratory failure with hypoxia we will continue to advance to full support as tolerated.  Secretions are still copious.  Continue with aggressive pulmonary toilet 2. Chronic kidney disease stage III we will continue to monitor the labs which should have normalized at this point 3. Nontraumatic intracranial hemorrhage grossly unchanged we will continue with supportive  care 4. Altered mental status medications continue to be worked on to be adjusted   I have personally seen and evaluated the patient, evaluated laboratory and imaging results, formulated the assessment and plan and placed orders. The Patient requires high complexity decision making for assessment and support.  Case was discussed on Rounds with the Respiratory Therapy Staff  Allyne Gee, MD [facility] Pulmonary Critical Care Medicine Sleep Medicine

## 2018-08-07 LAB — CBC
HCT: 25.6 % — ABNORMAL LOW (ref 39.0–52.0)
Hemoglobin: 8.1 g/dL — ABNORMAL LOW (ref 13.0–17.0)
MCH: 28.1 pg (ref 26.0–34.0)
MCHC: 31.6 g/dL (ref 30.0–36.0)
MCV: 88.9 fL (ref 80.0–100.0)
Platelets: 323 10*3/uL (ref 150–400)
RBC: 2.88 MIL/uL — ABNORMAL LOW (ref 4.22–5.81)
RDW: 16.4 % — ABNORMAL HIGH (ref 11.5–15.5)
WBC: 7.8 10*3/uL (ref 4.0–10.5)
nRBC: 0 % (ref 0.0–0.2)

## 2018-08-07 LAB — RENAL FUNCTION PANEL
Albumin: 2.2 g/dL — ABNORMAL LOW (ref 3.5–5.0)
Anion gap: 7 (ref 5–15)
BUN: 19 mg/dL (ref 8–23)
CO2: 33 mmol/L — ABNORMAL HIGH (ref 22–32)
Calcium: 8.8 mg/dL — ABNORMAL LOW (ref 8.9–10.3)
Chloride: 98 mmol/L (ref 98–111)
Creatinine, Ser: 0.91 mg/dL (ref 0.61–1.24)
GFR calc Af Amer: >60 mL/min (ref >60)
GFR calc non Af Amer: >60 mL/min (ref >60)
Glucose, Bld: 117 mg/dL — ABNORMAL HIGH (ref 70–99)
Phosphorus: 3.9 mg/dL (ref 2.5–4.6)
Potassium: 4 mmol/L (ref 3.5–5.1)
Sodium: 138 mmol/L (ref 135–145)

## 2018-08-07 LAB — HEMOGLOBIN A1C
Hgb A1c MFr Bld: 5.5 % (ref 4.8–5.6)
Mean Plasma Glucose: 111.15 mg/dL

## 2018-08-07 LAB — TSH: TSH: 2.517 u[IU]/mL (ref 0.350–4.500)

## 2018-08-07 LAB — MAGNESIUM: Magnesium: 2.3 mg/dL (ref 1.7–2.4)

## 2018-08-07 NOTE — Progress Notes (Addendum)
Pulmonary Critical Care Medicine [facility] [place]   PULMONARY CRITICAL CARE SERVICE  PROGRESS NOTE  Date of Service: 08/07/2018  Mark [name]  CEY:[id number]  DOB: [birth year]   DOA: 08/02/2018  Referring Physician: Carron Curie, MD  HPI: Mark [name] is a 68 y.o. male seen for follow up of Acute on Chronic Respiratory Failure.  Patient is currently on pressure support as tolerated 12/5 with an FiO2 of 28% satting well with no distress.  Medications: Reviewed on Rounds  Physical Exam:  Vitals: Pulse 54 respirations 10 BP 123/59 O2 sat 99% temp 97 1  Ventilator Settings pressure support of over 5 FiO2 of 28%  . General: Comfortable at this time . Eyes: Grossly normal lids, irises & conjunctiva . ENT: grossly tongue is normal . Neck: no obvious mass . Cardiovascular: S1 S2 normal no gallop . Respiratory: No rales or rhonchi noted . Abdomen: soft . Skin: no rash seen on limited exam . Musculoskeletal: not rigid . Psychiatric:unable to assess . Neurologic: no seizure no involuntary movements         Lab Data:   Basic Metabolic Panel: Recent Labs  Lab 08/03/18 0457 08/04/18 0733 08/05/18 1002 08/06/18 0523 08/07/18 0610  NA 140  --  136  --  138  K 3.3* 3.2* 3.2* 3.4* 4.0  CL 101  --  98  --  98  CO2 27  --  28  --  33*  GLUCOSE 103*  --  167*  --  117*  BUN 11  --  10  --  19  CREATININE 0.85  --  0.96  --  0.91  CALCIUM 8.7*  --  8.8*  --  8.8*  MG 1.9  --  2.0  --  2.3  PHOS 3.1  --   --   --  3.9    ABG: Recent Labs  Lab 08/02/18 1405 08/03/18 0550 08/03/18 0612 08/04/18 0600 08/05/18 0610  PHART 7.532* 7.522* 7.569* 7.578* 7.510*  PCO2ART 37.5 38.4 33.0 30.7* 37.8  PO2ART 167* 31.2* 99.7 123* 82.7*  HCO3 31.5* 31.3* 30.2* 28.7* 30.0*  O2SAT 99.6 62.4 98.7 99.4 97.2    Liver Function Tests: Recent Labs  Lab 08/03/18 0457 08/07/18 0610  AST 27  --   ALT 29  --   ALKPHOS 84  --   BILITOT 0.7  --   PROT 6.4*  --    ALBUMIN 2.4* 2.2*   No results for input(s): LIPASE, AMYLASE in the last 168 hours. No results for input(s): AMMONIA in the last 168 hours.  CBC: Recent Labs  Lab 08/03/18 0457 08/07/18 0610  WBC 8.7 7.8  NEUTROABS 5.2  --   HGB 8.7* 8.1*  HCT 27.3* 25.6*  MCV 87.5 88.9  PLT 442* 323    Cardiac Enzymes: No results for input(s): CKTOTAL, CKMB, CKMBINDEX, TROPONINI in the last 168 hours.  BNP (last 3 results) No results for input(s): BNP in the last 8760 hours.  ProBNP (last 3 results) No results for input(s): PROBNP in the last 8760 hours.  Radiological Exams: No results found.  Assessment/Plan Active Problems:   Acute on chronic respiratory failure with hypoxia (HCC)   Chronic kidney disease, stage III (moderate) (HCC)   Nontraumatic intracranial hemorrhage, unspecified (HCC)   Altered mental status, unspecified   1. Acute on chronic respiratory failure with hypoxia we will continue to wean on pressure support as tolerated.  Patient still has large amount of secretions at this time. Continue  with aggressive pulmonary toilet and supportive measures. 2. Chronic kidney disease stage III we will continue to monitor the labs which should have normalized at this point 3. Nontraumatic intracranial hemorrhage grossly unchanged we will continue with supportive care 4. Altered mental status medications continue to be worked on to be adjusted   I have personally seen and evaluated the patient, evaluated laboratory and imaging results, formulated the assessment and plan and placed orders. The Patient requires high complexity decision making for assessment and support.  Case was discussed on Rounds with the Respiratory Therapy Staff  Allyne Gee, MD [facility] Pulmonary Critical Care Medicine Sleep Medicine

## 2018-08-08 ENCOUNTER — Encounter: Payer: Self-pay | Admitting: Internal Medicine

## 2018-08-08 ENCOUNTER — Other Ambulatory Visit (HOSPITAL_COMMUNITY): Payer: Self-pay

## 2018-08-08 DIAGNOSIS — J9621 Acute and chronic respiratory failure with hypoxia: Secondary | ICD-10-CM | POA: Diagnosis present

## 2018-08-08 DIAGNOSIS — I629 Nontraumatic intracranial hemorrhage, unspecified: Secondary | ICD-10-CM

## 2018-08-08 DIAGNOSIS — N183 Chronic kidney disease, stage 3 unspecified: Secondary | ICD-10-CM | POA: Diagnosis present

## 2018-08-08 DIAGNOSIS — R4182 Altered mental status, unspecified: Secondary | ICD-10-CM | POA: Diagnosis present

## 2018-08-08 MED ORDER — GENERIC EXTERNAL MEDICATION
Status: DC
Start: ? — End: 2018-08-08

## 2018-08-08 NOTE — Progress Notes (Signed)
Pulmonary Critical Care Medicine [place]   PULMONARY CRITICAL CARE SERVICE  PROGRESS NOTE  Date of Service: 08/08/2018  Mark [name]  UEA:[id number]  DOB: [birth year]   DOA: 08/02/2018  Referring Physician: Merton Border, MD  HPI: Mark [name] is a 68 y.o. male seen for follow up of Acute on Chronic Respiratory Failure.  Patient still has significant issues with being able to wean.  Chest x-ray was showing increased fluid probable pulmonary edema  Medications: Reviewed on Rounds  Physical Exam:  Vitals: Temperature 97.5 pulse 66 respiratory 15 blood pressure 131/57 saturations 100%  Ventilator Settings mode ventilation pressure assist control FiO2 28% tidal volume 427 PEEP 5  . General: Comfortable at this time . Eyes: Grossly normal lids, irises & conjunctiva . ENT: grossly tongue is normal . Neck: no obvious mass . Cardiovascular: S1 S2 normal no gallop . Respiratory: No rhonchi no rales are noted . Abdomen: soft . Skin: no rash seen on limited exam . Musculoskeletal: not rigid . Psychiatric:unable to assess . Neurologic: no seizure no involuntary movements         Lab Data:   Basic Metabolic Panel: Recent Labs  Lab 08/03/18 0457 08/04/18 0733 08/05/18 1002 08/06/18 0523 08/07/18 0610  NA 140  --  136  --  138  K 3.3* 3.2* 3.2* 3.4* 4.0  CL 101  --  98  --  98  CO2 27  --  28  --  33*  GLUCOSE 103*  --  167*  --  117*  BUN 11  --  10  --  19  CREATININE 0.85  --  0.96  --  0.91  CALCIUM 8.7*  --  8.8*  --  8.8*  MG 1.9  --  2.0  --  2.3  PHOS 3.1  --   --   --  3.9    ABG: Recent Labs  Lab 08/02/18 1405 08/03/18 0550 08/03/18 0612 08/04/18 0600 08/05/18 0610  PHART 7.532* 7.522* 7.569* 7.578* 7.510*  PCO2ART 37.5 38.4 33.0 30.7* 37.8  PO2ART 167* 31.2* 99.7 123* 82.7*  HCO3 31.5* 31.3* 30.2* 28.7* 30.0*  O2SAT 99.6 62.4 98.7 99.4 97.2    Liver Function Tests: Recent Labs  Lab 08/03/18 0457 08/07/18 0610  AST  27  --   ALT 29  --   ALKPHOS 84  --   BILITOT 0.7  --   PROT 6.4*  --   ALBUMIN 2.4* 2.2*   No results for input(s): LIPASE, AMYLASE in the last 168 hours. No results for input(s): AMMONIA in the last 168 hours.  CBC: Recent Labs  Lab 08/03/18 0457 08/07/18 0610  WBC 8.7 7.8  NEUTROABS 5.2  --   HGB 8.7* 8.1*  HCT 27.3* 25.6*  MCV 87.5 88.9  PLT 442* 323    Cardiac Enzymes: No results for input(s): CKTOTAL, CKMB, CKMBINDEX, TROPONINI in the last 168 hours.  BNP (last 3 results) No results for input(s): BNP in the last 8760 hours.  ProBNP (last 3 results) No results for input(s): PROBNP in the last 8760 hours.  Radiological Exams: Dg Chest Port 1 View  Result Date: 08/08/2018 CLINICAL DATA:  S/P reverse total shoulder arthroplasty, left EXAM: PORTABLE CHEST 1 VIEW COMPARISON:  Radiograph 08/02/2010 FINDINGS: Tracheostomy tube in good position. Normal cardiac silhouette. Lungs are clear. No infiltrate or edema. No pneumothorax. IMPRESSION: No acute cardiopulmonary process. Electronically Signed   By: Suzy Bouchard M.D.   On: 08/08/2018 10:06  Assessment/Plan Active Problems:   Acute on chronic respiratory failure with hypoxia (HCC)   Chronic kidney disease, stage III (moderate) (HCC)   Nontraumatic intracranial hemorrhage, unspecified (HCC)   Altered mental status, unspecified   1. Acute on chronic respiratory failure with hypoxia patient will be scheduled for an echocardiogram to assess the LV function.  Follow-up chest x-ray was done does not show any acute process at this time 2. Chronic kidney disease stage III as noted previously labs have actually been stabilized. 3. Nontraumatic intracranial hemorrhage grossly unchanged.  We will continue with supportive care. 4. Possible congestive heart echocardiogram ordered follow-up chest x-ray reveals no acute pulmonary nodules   I have personally seen and evaluated the patient, evaluated laboratory and imaging  results, formulated the assessment and plan and placed orders. The Patient requires high complexity decision making for assessment and support.  Case was discussed on Rounds with the Respiratory Therapy Staff  Yevonne Pax, MD [facility] Pulmonary Critical Care Medicine Sleep Medicine

## 2018-08-09 ENCOUNTER — Other Ambulatory Visit (HOSPITAL_BASED_OUTPATIENT_CLINIC_OR_DEPARTMENT_OTHER): Payer: Medicare HMO

## 2018-08-09 DIAGNOSIS — I509 Heart failure, unspecified: Secondary | ICD-10-CM | POA: Diagnosis not present

## 2018-08-09 LAB — NOVEL CORONAVIRUS, NAA (HOSP ORDER, SEND-OUT TO REF LAB; TAT 18-24 HRS): SARS-CoV-2, NAA: NOT DETECTED

## 2018-08-09 MED ORDER — GENERIC EXTERNAL MEDICATION
Status: DC
Start: ? — End: 2018-08-09

## 2018-08-09 NOTE — Progress Notes (Addendum)
Pulmonary Critical Care Medicine [facility] [place]   PULMONARY CRITICAL CARE SERVICE  PROGRESS NOTE  Date of Service: 08/09/2018  Mark [name]  UXN:[id number]  DOB: October 16, 1950   DOA: 08/02/2018  Referring Physician: Carron Curie, MD  HPI: Mark [name] is a 68 y.o. male seen for follow up of Acute on Chronic Respiratory Failure.  Patient remains on ventilator full support at this time.  With a rate of 10 and FiO2 of 28%.  Respiratory therapy reports patient had apnea when trying to wean to pressure support today.  Will try again this afternoon.  Medications: Reviewed on Rounds  Physical Exam:  Vitals: Pulse 63 respirations 28 BP 134/62 O2 sat 100% temp 98.0  Ventilator Settings ventilator mode AC PC rate of 10 inspiratory pressure of 12 PEEP of 5 FiO2 of 28%  . General: Comfortable at this time . Eyes: Grossly normal lids, irises & conjunctiva . ENT: grossly tongue is normal . Neck: no obvious mass . Cardiovascular: S1 S2 normal no gallop . Respiratory: No rales or rhonchi noted . Abdomen: soft . Skin: no rash seen on limited exam . Musculoskeletal: not rigid . Psychiatric:unable to assess . Neurologic: no seizure no involuntary movements         Lab Data:   Basic Metabolic Panel: Recent Labs  Lab 08/03/18 0457 08/04/18 0733 08/05/18 1002 08/06/18 0523 08/07/18 0610  NA 140  --  136  --  138  K 3.3* 3.2* 3.2* 3.4* 4.0  CL 101  --  98  --  98  CO2 27  --  28  --  33*  GLUCOSE 103*  --  167*  --  117*  BUN 11  --  10  --  19  CREATININE 0.85  --  0.96  --  0.91  CALCIUM 8.7*  --  8.8*  --  8.8*  MG 1.9  --  2.0  --  2.3  PHOS 3.1  --   --   --  3.9    ABG: Recent Labs  Lab 08/03/18 0550 08/03/18 0612 08/04/18 0600 08/05/18 0610  PHART 7.522* 7.569* 7.578* 7.510*  PCO2ART 38.4 33.0 30.7* 37.8  PO2ART 31.2* 99.7 123* 82.7*  HCO3 31.3* 30.2* 28.7* 30.0*  O2SAT 62.4 98.7 99.4 97.2    Liver Function Tests: Recent Labs  Lab  08/03/18 0457 08/07/18 0610  AST 27  --   ALT 29  --   ALKPHOS 84  --   BILITOT 0.7  --   PROT 6.4*  --   ALBUMIN 2.4* 2.2*   No results for input(s): LIPASE, AMYLASE in the last 168 hours. No results for input(s): AMMONIA in the last 168 hours.  CBC: Recent Labs  Lab 08/03/18 0457 08/07/18 0610  WBC 8.7 7.8  NEUTROABS 5.2  --   HGB 8.7* 8.1*  HCT 27.3* 25.6*  MCV 87.5 88.9  PLT 442* 323    Cardiac Enzymes: No results for input(s): CKTOTAL, CKMB, CKMBINDEX, TROPONINI in the last 168 hours.  BNP (last 3 results) No results for input(s): BNP in the last 8760 hours.  ProBNP (last 3 results) No results for input(s): PROBNP in the last 8760 hours.  Radiological Exams: Dg Chest Port 1 View  Result Date: 08/08/2018 CLINICAL DATA:  S/P reverse total shoulder arthroplasty, left EXAM: PORTABLE CHEST 1 VIEW COMPARISON:  Radiograph 08/02/2010 FINDINGS: Tracheostomy tube in good position. Normal cardiac silhouette. Lungs are clear. No infiltrate or edema. No pneumothorax. IMPRESSION: No acute cardiopulmonary  process. Electronically Signed   By: Suzy Bouchard M.D.   On: 08/08/2018 10:06    Assessment/Plan Active Problems:   Acute on chronic respiratory failure with hypoxia (HCC)   Chronic kidney disease, stage III (moderate) (HCC)   Nontraumatic intracranial hemorrhage, unspecified (HCC)   Altered mental status, unspecified   1. Acute on chronic respiratory failure with hypoxia patient resting on full support at this time.   Continue supportive measures and pulmonary toilet. 2. Chronic kidney disease stage III as noted previously labs have actually been stabilized. 3. Nontraumatic intracranial hemorrhage grossly unchanged.  We will continue with supportive care. 4. Possible congestive heart echocardiogram ordered follow-up chest x-ray reveals no acute pulmonary nodules   I have personally seen and evaluated the patient, evaluated laboratory and imaging results, formulated  the assessment and plan and placed orders. The Patient requires high complexity decision making for assessment and support.  Case was discussed on Rounds with the Respiratory Therapy Staff  Allyne Gee, MD [facility] Pulmonary Critical Care Medicine Sleep Medicine

## 2018-08-09 NOTE — Progress Notes (Signed)
  Echocardiogram 2D Echocardiogram with Definity has been performed. Initial dose of Definity administered. I did not see it appear on screen. No further Definity given. Nurse notified.  Mark [name] 08/09/2018, 11:40 AM

## 2018-08-10 LAB — CBC
HCT: 28 % — ABNORMAL LOW (ref 39.0–52.0)
Hemoglobin: 8.8 g/dL — ABNORMAL LOW (ref 13.0–17.0)
MCH: 27.9 pg (ref 26.0–34.0)
MCHC: 31.4 g/dL (ref 30.0–36.0)
MCV: 88.9 fL (ref 80.0–100.0)
Platelets: 373 10*3/uL (ref 150–400)
RBC: 3.15 MIL/uL — ABNORMAL LOW (ref 4.22–5.81)
RDW: 16.6 % — ABNORMAL HIGH (ref 11.5–15.5)
WBC: 10.6 10*3/uL — ABNORMAL HIGH (ref 4.0–10.5)
nRBC: 0 % (ref 0.0–0.2)

## 2018-08-10 LAB — BASIC METABOLIC PANEL
Anion gap: 9 (ref 5–15)
BUN: 25 mg/dL — ABNORMAL HIGH (ref 8–23)
CO2: 30 mmol/L (ref 22–32)
Calcium: 9.2 mg/dL (ref 8.9–10.3)
Chloride: 101 mmol/L (ref 98–111)
Creatinine, Ser: 1.03 mg/dL (ref 0.61–1.24)
GFR calc Af Amer: >60 mL/min (ref >60)
GFR calc non Af Amer: >60 mL/min (ref >60)
Glucose, Bld: 115 mg/dL — ABNORMAL HIGH (ref 70–99)
Potassium: 4.3 mmol/L (ref 3.5–5.1)
Sodium: 140 mmol/L (ref 135–145)

## 2018-08-10 LAB — PHOSPHORUS: Phosphorus: 3.4 mg/dL (ref 2.5–4.6)

## 2018-08-10 LAB — MAGNESIUM: Magnesium: 2.2 mg/dL (ref 1.7–2.4)

## 2018-08-10 NOTE — Progress Notes (Addendum)
Pulmonary Critical Care Medicine [place]   PULMONARY CRITICAL CARE SERVICE  PROGRESS NOTE  Date of Service: 08/10/2018  Mark [name]  [id number]:[id number]  DOB: [birth year]   DOA: 08/02/2018  Referring Physician: Merton Border, MD  HPI: Mark [name] is a 68 y.o. male seen for follow up of Acute on Chronic Respiratory Failure.  Patient remains on aerosol trach collar 28% for goal of 2 to 4 hours today.  Satting well with no distress.  Medications: Reviewed on Rounds  Physical Exam:  Vitals: Pulse 56 respirations 17 BP 137/78 O2 sat 97% temp 97.9  Ventilator Settings ATC 28%  . General: Comfortable at this time . Eyes: Grossly normal lids, irises & conjunctiva . ENT: grossly tongue is normal . Neck: no obvious mass . Cardiovascular: S1 S2 normal no gallop . Respiratory: No rales or rhonchi noted . Abdomen: soft . Skin: no rash seen on limited exam . Musculoskeletal: not rigid . Psychiatric:unable to assess . Neurologic: no seizure no involuntary movements         Lab Data:   Basic Metabolic Panel: Recent Labs  Lab 08/04/18 0733 08/05/18 1002 08/06/18 0523 08/07/18 0610 08/10/18 0826  NA  --  136  --  138 140  K 3.2* 3.2* 3.4* 4.0 4.3  CL  --  98  --  98 101  CO2  --  28  --  33* 30  GLUCOSE  --  167*  --  117* 115*  BUN  --  10  --  19 25*  CREATININE  --  0.96  --  0.91 1.03  CALCIUM  --  8.8*  --  8.8* 9.2  MG  --  2.0  --  2.3 2.2  PHOS  --   --   --  3.9 3.4    ABG: Recent Labs  Lab 08/04/18 0600 08/05/18 0610  PHART 7.578* 7.510*  PCO2ART 30.7* 37.8  PO2ART 123* 82.7*  HCO3 28.7* 30.0*  O2SAT 99.4 97.2    Liver Function Tests: Recent Labs  Lab 08/07/18 0610  ALBUMIN 2.2*   No results for input(s): LIPASE, AMYLASE in the last 168 hours. No results for input(s): AMMONIA in the last 168 hours.  CBC: Recent Labs  Lab 08/07/18 0610 08/10/18 0826  WBC 7.8 10.6*  HGB 8.1* 8.8*  HCT 25.6* 28.0*  MCV 88.9 88.9   PLT 323 373    Cardiac Enzymes: No results for input(s): CKTOTAL, CKMB, CKMBINDEX, TROPONINI in the last 168 hours.  BNP (last 3 results) No results for input(s): BNP in the last 8760 hours.  ProBNP (last 3 results) No results for input(s): PROBNP in the last 8760 hours.  Radiological Exams: No results found.  Assessment/Plan Active Problems:   Acute on chronic respiratory failure with hypoxia (HCC)   Chronic kidney disease, stage III (moderate) (HCC)   Nontraumatic intracranial hemorrhage, unspecified (HCC)   Altered mental status, unspecified   1. Acute on chronic respiratory failure with hypoxia continue weaning per protocol goal 2 to 4 hours on aerosol trach collar 20% FiO2 today.  Continue supportive measures and pulmonary toilet. 2. Chronic kidney disease stage III as noted previously labs have actually been stabilized. 3. Nontraumatic intracranial hemorrhage grossly unchanged. We will continue with supportive care. 4. Possible congestive heart echocardiogram ordered follow-up chest x-ray reveals no acute pulmonary nodules   I have personally seen and evaluated the patient, evaluated laboratory and imaging results, formulated the assessment and plan and placed orders. The  Patient requires high complexity decision making for assessment and support.  Case was discussed on Rounds with the Respiratory Therapy Staff  Allyne Gee, MD [facility] Pulmonary Critical Care Medicine Sleep Medicine

## 2018-08-11 NOTE — Progress Notes (Addendum)
Pulmonary Critical Care Medicine [place]   PULMONARY CRITICAL CARE SERVICE  PROGRESS NOTE  Date of Service: 08/11/2018  Mark [name]  [id number]  DOB: [birth year]   DOA: 08/02/2018  Referring Physician: Merton Border, MD  HPI: Mark [name] is a 68 y.o. male seen for follow up of Acute on Chronic Respiratory Failure.  Patient remains on aerosol collar 28% FiO2 has an 8-hour goal today.  Currently satting well with no distress.    Medications: Reviewed on Rounds  Physical Exam:  Vitals: Pulse 52 respiration 12 BP 143/78 O2 sat 99% temp 98.4  Ventilator Settings ATC 28%  . General: Comfortable at this time . Eyes: Grossly normal lids, irises & conjunctiva . ENT: grossly tongue is normal . Neck: no obvious mass . Cardiovascular: S1 S2 normal no gallop . Respiratory: No rales or rhonchi noted . Abdomen: soft . Skin: no rash seen on limited exam . Musculoskeletal: not rigid . Psychiatric:unable to assess . Neurologic: no seizure no involuntary movements         Lab Data:   Basic Metabolic Panel: Recent Labs  Lab 08/05/18 1002 08/06/18 0523 08/07/18 0610 08/10/18 0826  NA 136  --  138 140  K 3.2* 3.4* 4.0 4.3  CL 98  --  98 101  CO2 28  --  33* 30  GLUCOSE 167*  --  117* 115*  BUN 10  --  19 25*  CREATININE 0.96  --  0.91 1.03  CALCIUM 8.8*  --  8.8* 9.2  MG 2.0  --  2.3 2.2  PHOS  --   --  3.9 3.4    ABG: Recent Labs  Lab 08/05/18 0610  PHART 7.510*  PCO2ART 37.8  PO2ART 82.7*  HCO3 30.0*  O2SAT 97.2    Liver Function Tests: Recent Labs  Lab 08/07/18 0610  ALBUMIN 2.2*   No results for input(s): LIPASE, AMYLASE in the last 168 hours. No results for input(s): AMMONIA in the last 168 hours.  CBC: Recent Labs  Lab 08/07/18 0610 08/10/18 0826  WBC 7.8 10.6*  HGB 8.1* 8.8*  HCT 25.6* 28.0*  MCV 88.9 88.9  PLT 323 373    Cardiac Enzymes: No results for input(s): CKTOTAL, CKMB, CKMBINDEX, TROPONINI in the  last 168 hours.  BNP (last 3 results) No results for input(s): BNP in the last 8760 hours.  ProBNP (last 3 results) No results for input(s): PROBNP in the last 8760 hours.  Radiological Exams: No results found.  Assessment/Plan Active Problems:   Acute on chronic respiratory failure with hypoxia (HCC)   Chronic kidney disease, stage III (moderate) (HCC)   Nontraumatic intracranial hemorrhage, unspecified (HCC)   Altered mental status, unspecified   1. Acute on chronic respiratory failure with hypoxia continue weaning per protocol goal 8 hours on aerosol trach collar 28% FiO2 today.  Continue supportive measures and pulmonary toilet. 2. Chronic kidney disease stage III as noted previously labs have actually been stabilized. 3. Nontraumatic intracranial hemorrhage grossly unchanged. We will continue with supportive care. 4. Possible congestive heart echocardiogram ordered follow-up chest x-ray reveals no acute pulmonary nodules   I have personally seen and evaluated the patient, evaluated laboratory and imaging results, formulated the assessment and plan and placed orders. The Patient requires high complexity decision making for assessment and support.  Case was discussed on Rounds with the Respiratory Therapy Staff  Allyne Gee, MD [facility] Pulmonary Critical Care Medicine Sleep Medicine

## 2018-08-12 NOTE — Progress Notes (Addendum)
Pulmonary Critical Care Medicine [name]   PULMONARY CRITICAL CARE SERVICE  PROGRESS NOTE  Date of Service: 08/12/2018  Mark [name]  [id number]:[id number]  DOB: 29-Oct-1950   DOA: 08/02/2018  Referring Physician: Merton Border, MD  HPI: Mark [name] is a 68 y.o. male seen for follow up of Acute on Chronic Respiratory Failure.  Patient is a 12-hour goal on aerosol trach collar 20% FiO2 has a large amount of secretions noted today.  Satting well currently with no distress.  Medications: Reviewed on Rounds  Physical Exam:  Vitals: Pulse 60 respirations 10 BP 116/74 O2 sat 100% temp 97.7   Ventilator Settings ATC 28%  . General: Comfortable at this time . Eyes: Grossly normal lids, irises & conjunctiva . ENT: grossly tongue is normal . Neck: no obvious mass . Cardiovascular: S1 S2 normal no gallop . Respiratory: No rales or rhonchi noted . Abdomen: soft . Skin: no rash seen on limited exam . Musculoskeletal: not rigid . Psychiatric:unable to assess . Neurologic: no seizure no involuntary movements         Lab Data:   Basic Metabolic Panel: Recent Labs  Lab 08/06/18 0523 08/07/18 0610 08/10/18 0826  NA  --  138 140  K 3.4* 4.0 4.3  CL  --  98 101  CO2  --  33* 30  GLUCOSE  --  117* 115*  BUN  --  19 25*  CREATININE  --  0.91 1.03  CALCIUM  --  8.8* 9.2  MG  --  2.3 2.2  PHOS  --  3.9 3.4    ABG: No results for input(s): PHART, PCO2ART, PO2ART, HCO3, O2SAT in the last 168 hours.  Liver Function Tests: Recent Labs  Lab 08/07/18 0610  ALBUMIN 2.2*   No results for input(s): LIPASE, AMYLASE in the last 168 hours. No results for input(s): AMMONIA in the last 168 hours.  CBC: Recent Labs  Lab 08/07/18 0610 08/10/18 0826  WBC 7.8 10.6*  HGB 8.1* 8.8*  HCT 25.6* 28.0*  MCV 88.9 88.9  PLT 323 373    Cardiac Enzymes: No results for input(s): CKTOTAL, CKMB, CKMBINDEX, TROPONINI in the last 168 hours.  BNP (last 3 results) No  results for input(s): BNP in the last 8760 hours.  ProBNP (last 3 results) No results for input(s): PROBNP in the last 8760 hours.  Radiological Exams: No results found.  Assessment/Plan Active Problems:   Acute on chronic respiratory failure with hypoxia (HCC)   Chronic kidney disease, stage III (moderate) (HCC)   Nontraumatic intracranial hemorrhage, unspecified (HCC)   Altered mental status, unspecified   1. Acute on chronic respiratory failure with hypoxiacontinue weaning per protocol goal 12 hours on aerosol trach collar 28% FiO2 today. Continue supportive measures and pulmonary toilet. 2. Chronic kidney disease stage III as noted previously labs have actually been stabilized. 3. Nontraumatic intracranial hemorrhage grossly unchanged. We will continue with supportive care. 4. Possible congestive heart echocardiogram ordered follow-up chest x-ray reveals no acute pulmonary nodules   I have personally seen and evaluated the patient, evaluated laboratory and imaging results, formulated the assessment and plan and placed orders. The Patient requires high complexity decision making for assessment and support.  Case was discussed on Rounds with the Respiratory Therapy Staff  Allyne Gee, MD [facility] Pulmonary Critical Care Medicine Sleep Medicine

## 2018-08-13 LAB — CBC
HCT: 30.4 % — ABNORMAL LOW (ref 39.0–52.0)
Hemoglobin: 9.5 g/dL — ABNORMAL LOW (ref 13.0–17.0)
MCH: 27.9 pg (ref 26.0–34.0)
MCHC: 31.3 g/dL (ref 30.0–36.0)
MCV: 89.4 fL (ref 80.0–100.0)
Platelets: 377 10*3/uL (ref 150–400)
RBC: 3.4 MIL/uL — ABNORMAL LOW (ref 4.22–5.81)
RDW: 17.2 % — ABNORMAL HIGH (ref 11.5–15.5)
WBC: 7.9 10*3/uL (ref 4.0–10.5)
nRBC: 0 % (ref 0.0–0.2)

## 2018-08-13 LAB — BASIC METABOLIC PANEL
Anion gap: 8 (ref 5–15)
BUN: 31 mg/dL — ABNORMAL HIGH (ref 8–23)
CO2: 29 mmol/L (ref 22–32)
Calcium: 9.3 mg/dL (ref 8.9–10.3)
Chloride: 104 mmol/L (ref 98–111)
Creatinine, Ser: 1.12 mg/dL (ref 0.61–1.24)
GFR calc Af Amer: >60 mL/min (ref >60)
GFR calc non Af Amer: >60 mL/min (ref >60)
Glucose, Bld: 121 mg/dL — ABNORMAL HIGH (ref 70–99)
Potassium: 3.8 mmol/L (ref 3.5–5.1)
Sodium: 141 mmol/L (ref 135–145)

## 2018-08-13 LAB — MAGNESIUM: Magnesium: 2.3 mg/dL (ref 1.7–2.4)

## 2018-08-13 NOTE — Progress Notes (Addendum)
Pulmonary Critical Care Medicine [place]   PULMONARY CRITICAL CARE SERVICE  PROGRESS NOTE  Date of Service: 08/13/2018  Mark [name]  [id number]:[id number]  DOB: Feb 26, 1951   DOA: 08/02/2018  Referring Physician: Merton Border, MD  HPI: Mark [name] is a 68 y.o. male seen for follow up of Acute on Chronic Respiratory Failure.  Patient has a 16-hour goal today on aerosol trach collar 28% FiO2.  Currently satting 100% with no fever or distress noted.  Medications: Reviewed on Rounds  Physical Exam:  Vitals: Pulse 56 respirations 13 BP 143/66 O2 sat 100% temp 96.0  Ventilator Settings ATC 28%  . General: Comfortable at this time . Eyes: Grossly normal lids, irises & conjunctiva . ENT: grossly tongue is normal . Neck: no obvious mass . Cardiovascular: S1 S2 normal no gallop . Respiratory: No rales or rhonchi noted . Abdomen: soft . Skin: no rash seen on limited exam . Musculoskeletal: not rigid . Psychiatric:unable to assess . Neurologic: no seizure no involuntary movements         Lab Data:   Basic Metabolic Panel: Recent Labs  Lab 08/07/18 0610 08/10/18 0826 08/13/18 0646  NA 138 140 141  K 4.0 4.3 3.8  CL 98 101 104  CO2 33* 30 29  GLUCOSE 117* 115* 121*  BUN 19 25* 31*  CREATININE 0.91 1.03 1.12  CALCIUM 8.8* 9.2 9.3  MG 2.3 2.2 2.3  PHOS 3.9 3.4  --     ABG: No results for input(s): PHART, PCO2ART, PO2ART, HCO3, O2SAT in the last 168 hours.  Liver Function Tests: Recent Labs  Lab 08/07/18 0610  ALBUMIN 2.2*   No results for input(s): LIPASE, AMYLASE in the last 168 hours. No results for input(s): AMMONIA in the last 168 hours.  CBC: Recent Labs  Lab 08/07/18 0610 08/10/18 0826 08/13/18 0646  WBC 7.8 10.6* 7.9  HGB 8.1* 8.8* 9.5*  HCT 25.6* 28.0* 30.4*  MCV 88.9 88.9 89.4  PLT 323 373 377    Cardiac Enzymes: No results for input(s): CKTOTAL, CKMB, CKMBINDEX, TROPONINI in the last 168 hours.  BNP (last 3  results) No results for input(s): BNP in the last 8760 hours.  ProBNP (last 3 results) No results for input(s): PROBNP in the last 8760 hours.  Radiological Exams: No results found.  Assessment/Plan Active Problems:   Acute on chronic respiratory failure with hypoxia (HCC)   Chronic kidney disease, stage III (moderate) (HCC)   Nontraumatic intracranial hemorrhage, unspecified (HCC)   Altered mental status, unspecified   1. Acute on chronic respiratory failure with hypoxiacontinue weaning per protocol goal16hours on aerosol trach collar 28% FiO2 today. Continue supportive measures and pulmonary toilet. 2. Chronic kidney disease stage III as noted previously labs have actually been stabilized. 3. Nontraumatic intracranial hemorrhage grossly unchanged. We will continue with supportive care. 4. Possible congestive heart echocardiogram ordered follow-up chest x-ray reveals no acute pulmonary nodules   I have personally seen and evaluated the patient, evaluated laboratory and imaging results, formulated the assessment and plan and placed orders. The Patient requires high complexity decision making for assessment and support.  Case was discussed on Rounds with the Respiratory Therapy Staff  Allyne Gee, MD [facility] Pulmonary Critical Care Medicine Sleep Medicine

## 2018-08-14 NOTE — Progress Notes (Addendum)
Pulmonary Critical Care Medicine [place]   PULMONARY CRITICAL CARE SERVICE  PROGRESS NOTE  Date of Service: 08/14/2018  Mark [name]  [id number]  DOB: Sep 27, 1950   DOA: 08/02/2018  Referring Physician: Merton Border, MD  HPI: Mark [name] is a 68 y.o. male seen for follow up of Acute on Chronic Respiratory Failure.  Patient is a 20-hour goal today on aerosol trach collar 28% FiO2.  Satting well with no distress at this time.  Medications: Reviewed on Rounds  Physical Exam:  Vitals: Pulse 60 respirations 10 BP 167/80 O2 sat 100% temp 97.6  Ventilator Settings ATC 28%  . General: Comfortable at this time . Eyes: Grossly normal lids, irises & conjunctiva . ENT: grossly tongue is normal . Neck: no obvious mass . Cardiovascular: S1 S2 normal no gallop . Respiratory: No rales or rhonchi noted . Abdomen: soft . Skin: no rash seen on limited exam . Musculoskeletal: not rigid . Psychiatric:unable to assess . Neurologic: no seizure no involuntary movements         Lab Data:   Basic Metabolic Panel: Recent Labs  Lab 08/10/18 0826 08/13/18 0646  NA 140 141  K 4.3 3.8  CL 101 104  CO2 30 29  GLUCOSE 115* 121*  BUN 25* 31*  CREATININE 1.03 1.12  CALCIUM 9.2 9.3  MG 2.2 2.3  PHOS 3.4  --     ABG: No results for input(s): PHART, PCO2ART, PO2ART, HCO3, O2SAT in the last 168 hours.  Liver Function Tests: No results for input(s): AST, ALT, ALKPHOS, BILITOT, PROT, ALBUMIN in the last 168 hours. No results for input(s): LIPASE, AMYLASE in the last 168 hours. No results for input(s): AMMONIA in the last 168 hours.  CBC: Recent Labs  Lab 08/10/18 0826 08/13/18 0646  WBC 10.6* 7.9  HGB 8.8* 9.5*  HCT 28.0* 30.4*  MCV 88.9 89.4  PLT 373 377    Cardiac Enzymes: No results for input(s): CKTOTAL, CKMB, CKMBINDEX, TROPONINI in the last 168 hours.  BNP (last 3 results) No results for input(s): BNP in the last 8760 hours.  ProBNP  (last 3 results) No results for input(s): PROBNP in the last 8760 hours.  Radiological Exams: No results found.  Assessment/Plan Active Problems:   Acute on chronic respiratory failure with hypoxia (HCC)   Chronic kidney disease, stage III (moderate) (HCC)   Nontraumatic intracranial hemorrhage, unspecified (HCC)   Altered mental status, unspecified   1. Acute on chronic respiratory failure with hypoxiacontinue weaning per protocol goal20hours on aerosol trach collar 28% FiO2 today. Continue supportive measures and pulmonary toilet. 2. Chronic kidney disease stage III as noted previously labs have actually been stabilized. 3. Nontraumatic intracranial hemorrhage grossly unchanged. We will continue with supportive care. 4. Possible congestive heart echocardiogram ordered follow-up chest x-ray reveals no acute pulmonary nodules   I have personally seen and evaluated the patient, evaluated laboratory and imaging results, formulated the assessment and plan and placed orders. The Patient requires high complexity decision making for assessment and support.  Case was discussed on Rounds with the Respiratory Therapy Staff  Allyne Gee, MD [facility] Pulmonary Critical Care Medicine Sleep Medicine

## 2018-08-15 NOTE — Progress Notes (Addendum)
Pulmonary Critical Care Medicine [place]   PULMONARY CRITICAL CARE SERVICE  PROGRESS NOTE  Date of Service: 08/15/2018  Mark [name]  [id number]  DOB: 10-31-1950   DOA: 08/02/2018  Referring Physician: Merton Border, MD  HPI: Mark [name] [name] is a 68 y.o. male seen for follow up of Acute on Chronic Respiratory Failure.  Patient remains on aerosol trach collar 20% FiO2 for goal 24 hours today.  Satting well currently distress.  Medications: Reviewed on Rounds  Physical Exam:  Vitals: Pulse 56 respirations 16 BP 139/71 O2 sat 100% temp 97.4  Ventilator Settings AC 28%  . General: Comfortable at this time . Eyes: Grossly normal lids, irises & conjunctiva . ENT: grossly tongue is normal . Neck: no obvious mass . Cardiovascular: S1 S2 normal no gallop . Respiratory: No rales rhonchi noted . Abdomen: soft . Skin: no rash seen on limited exam . Musculoskeletal: not rigid . Psychiatric:unable to assess . Neurologic: no seizure no involuntary movements         Lab Data:   Basic Metabolic Panel: Recent Labs  Lab 08/10/18 0826 08/13/18 0646  NA 140 141  K 4.3 3.8  CL 101 104  CO2 30 29  GLUCOSE 115* 121*  BUN 25* 31*  CREATININE 1.03 1.12  CALCIUM 9.2 9.3  MG 2.2 2.3  PHOS 3.4  --     ABG: No results for input(s): PHART, PCO2ART, PO2ART, HCO3, O2SAT in the last 168 hours.  Liver Function Tests: No results for input(s): AST, ALT, ALKPHOS, BILITOT, PROT, ALBUMIN in the last 168 hours. No results for input(s): LIPASE, AMYLASE in the last 168 hours. No results for input(s): AMMONIA in the last 168 hours.  CBC: Recent Labs  Lab 08/10/18 0826 08/13/18 0646  WBC 10.6* 7.9  HGB 8.8* 9.5*  HCT 28.0* 30.4*  MCV 88.9 89.4  PLT 373 377    Cardiac Enzymes: No results for input(s): CKTOTAL, CKMB, CKMBINDEX, TROPONINI in the last 168 hours.  BNP (last 3 results) No results for input(s): BNP in the last 8760 hours.  ProBNP (last 3  results) No results for input(s): PROBNP in the last 8760 hours.  Radiological Exams: No results found.  Assessment/Plan Active Problems:   Acute on chronic respiratory failure with hypoxia (HCC)   Chronic kidney disease, stage III (moderate) (HCC)   Nontraumatic intracranial hemorrhage, unspecified (HCC)   Altered mental status, unspecified   1. Acute on chronic respiratory failure with hypoxiacontinue weaning per protocol goal24 hours on aerosol trach collar 28% FiO2 today. Continue supportive measures and pulmonary toilet. 2. Chronic kidney disease stage III as noted previously labs have actually been stabilized. 3. Nontraumatic intracranial hemorrhage grossly unchanged. We will continue with supportive care. 4. Possible congestive heart echocardiogram ordered follow-up chest x-ray reveals no acute pulmonary nodules.   I have personally seen and evaluated the patient, evaluated laboratory and imaging results, formulated the assessment and plan and placed orders. The Patient requires high complexity decision making for assessment and support.  Case was discussed on Rounds with the Respiratory Therapy Staff  Allyne Gee, MD [facility] Pulmonary Critical Care Medicine Sleep Medicine

## 2018-08-16 ENCOUNTER — Encounter: Payer: Self-pay | Admitting: Occupational Therapy

## 2018-08-16 NOTE — PMR Pre-admission (Signed)
PMR Admission Coordinator Pre-Admission Assessment  Patient: Mark [name] IV is an 68 y.o., male MRN: [id number] DOB: 23-Jan-1951 Height: '5\' 6"'$  (1.676 m) Weight: 59.9 kg  Insurance Information HMO:     PPO: yes     PCP:      IPA:      80/20:      OTHER:  PRIMARY: [employer] Medicare      Policy#: [id number]      Subscriber: patient CM NameLarene Beach     Phone#: [phone]     Fax#: [phone] Pre-Cert#: [id number]      Employer:  Josem Kaufmann [id number] provided by Larene Beach for admit to CIR. Pt is approved for 7 days. With review due 6/30. F/u clinicals are due to [employer] (p): [phone] ([phone]  Benefits:  Phone #: online     Name: [email or web address] Eff. Date: 02/27/18 still active     Deduct: NA      Out of Pocket Max: $4,200 ($345.00 met)      Life Max: NA CIR: $250/day co pay with a max co-pay of $1,500/admission      SNF: $0/day co pay for dayas 1-20, $178/day co pay for days 21-100; limited to 100 days Outpatient: limited by medical necessity     Co-Pay: $35/visit co-pay Home Health: 100%, limited by medical necessity     Co-Pay: 0% DME: 80%     Co-Pay: 20% Providers:  SECONDARY:       Policy#:       Subscriber:  CM Name:       Phone#:      Fax#:  Pre-Cert#:       Employer:  Benefits:  Phone #:      Name:  Eff. Date:      Deduct:       Out of Pocket Max:       Life Max:  CIR:       SNF:  Outpatient:      Co-Pay:  Home Health:       Co-Pay:  DME:      Co-Pay:   Medicaid Application Date:       Case Manager:  Disability Application Date:       Case Worker:   The "Data Collection Information Summary" for patients in Inpatient Rehabilitation Facilities with attached "Privacy Act [place] Records" was provided and verbally reviewed with: Family  Emergency Contact Information Contact Information    Name Relation Home Work [place] Daughter [phone]  [phone]      Current Medical History  Patient Admitting Diagnosis:  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   History of Present Illness: Pt is a 68 yo M with past medical history significant for HTN, AAA, history of ischemic stroke without deficits, Left temporal AVM, and tobacco abuse. Pt presented to an outside hospital due to right hemiparesi. Workup revealed left temporal intracerebral hemorrhage related to an AVM rupture. Pt required multiple procedures, including left craniectomy with hematoma evacuation, partial AVM resection, partial embolization of the AVM and resection of the residual AVM with cranioplasty on 5/26; Pt underwent bone flap/subdural drain placement on 5/27. Pt requried ventilator support, tracheostomy on 5/20 and PEG placement on 5/23. The pt has been able to tolerate weaning from the vent with pt sating on RA with size 4 cuffless trach; pt has been capped >24 hours. He does continue to have dense receptive aphasia with mentation slowing improving. Pt remains on tube feeds currently. Pt is to be admitted  to CIR on 08/21/18.     Patient's medical record from [facility] has been reviewed by the rehabilitation admission coordinator and physician.  Past Medical History  Past Medical History:  Diagnosis Date  . Acute on chronic respiratory failure with hypoxia ([place])   . Altered mental status, unspecified   . Chronic kidney disease    CKD stage 3  . Chronic kidney disease, stage III (moderate) (HCC)   . Concussion    multiple in high school  . Coronary artery disease    pt denies  . Diabetes mellitus without complication (HCC)    borderline  . Hypertension   . Nontraumatic intracranial hemorrhage, unspecified ([place])   . PVD (peripheral vascular disease) (HCC)    leg stents femoral artery  . Stroke [facility])    3 years ago- no residual    Family History   family history includes Dementia in his mother; Diabetes in his father and mother; Heart disease in his father and mother.  Prior  Rehab/Hospitalizations Has the patient had prior rehab or hospitalizations prior to admission? Yes  Has the patient had major surgery during 100 days prior to admission? Yes   Current Medications No current outpatient medications on file.  Patients Current Diet:  Diet Order    None      Precautions / Restrictions     Has the patient had 2 or more falls or a fall with injury in the past year? No  Prior Activity Level    Prior Functional Level Self Care: Did the patient need help bathing, dressing, using the toilet or eating? Independent  Indoor Mobility: Did the patient need assistance with walking from room to room (with or without device)? Independent  Stairs: Did the patient need assistance with internal or external stairs (with or without device)? Independent  Functional Cognition: Did the patient need help planning regular tasks such as shopping or remembering to take medications? Independent  Home Assistive Devices / Equipment    Prior Device Use: Indicate devices/aids used by the patient prior to current illness, exacerbation or injury? None of the above   **Due to illegibility of written notes by therapists, [facility] has spoken with PT and OT on 6/22 for verbal functional level assessment, represented in "Current functional level" below:    Prior Functional Level Current Functional Level  Bed Mobility  Independent  Min A   Transfers  Independent   Min A   Mobility - Walk/Wheelchair  Independent  Min/Mod A 5 feet with HHA and verbal cues    Upper Body Dressing  Independent   SBA; use of gestures for guidance   Lower Body Dressing  Independent   Min A   Grooming  Independent   SBA   Eating/Drinking  Independent   NPO   Toilet Transfer  Independent   Min A with RW   Bladder Continence   continent   incontinent   Bowel Management  continent   incontinent   Stair Climbing   Independent   NT   Communication  WNL   aphasic   Memory  WNL   (difficult to assess due to aphasia based on clinical judgement by [facility])   Driving   Independent     Special needs/care consideration BiPAP/CPAP : no CPM : no Continuous Drip IV : tube feeds Dialysis : no        Days : no Life Vest : no Oxygen : RA Special Bed : NA Trach Size : 4 cuffless (  trach capped) Wound Vac (area) : no      Location : no Skin : cranial wound s/p removal of 46 staples; right foot 2nd toe scab to dorsum of foot, cyst to L lateral malleolus; PEG tube placed, tracheostomy   Bowel mgmt: incontinent, last BM: 6/23 Bladder mgmt: incontinent Diabetic mgmt: no Behavioral consideration : restless; attempts to get OOB; wrist restrains on 6/24 Chemo/radiation : no   Previous Home Environment (from acute therapy documentation)  Pt lives alone in a 2 story house with two steps to enter his home (no railing). Pt has a tub shower, standard commode and his bathroom is RW accessible per his grown children.   Discharge Living Setting  Pt is to DC back to his house (2 story home but can live on first floor per family). Pt will need to negotiate approx 2 steps to enter and can utilize tub shower on main floor. Pt will have 24/7 S from children (daughter lives in [place] and son in [place]) + hired help.   Social/Family/Support Systems   Pt has son Westenberger): [phone] & duaghter Thomes Dinning): [phone] who live is [place] and [place] respectively. They are currently switching off staying at his house while he is in the hospital. They have flexible jobs and can work from home (they plan to be the 24/7 A at home) for extended period of time. They are open to using hired help as needed but will need suggestions for [place]. They are very supportive and involved in his care. Will need training on trach care and PEG feeds prior to DC as they will be main supports.   Goals/Additional Needs  PT/OT: PT: Supervision; OT: Supervision; SLP: Min A  Estimated Length of Stay:  10-14 days   Decrease burden of Care through IP rehab admission: NA  Possible need for SNF placement upon discharge: Not anticipated; pt has 24/7 A at DC from family with family willing to hire assistance as needed; family very involved in care and with training and CIR should be able to take pt home at DC.   Patient Condition: I have reviewed medical records from [facility], spoken with CSW, RN, PT, OT, and RT, and patient, son and daughter. I met with patient at the bedside for inpatient rehabilitation assessment.  Patient will benefit from ongoing PT, OT and SLP, can actively participate in 3 hours of therapy a day 5 days of the week, and can make measurable gains during the admission.  Patient will also benefit from the coordinated team approach during an Inpatient Acute Rehabilitation admission.  The patient will receive intensive therapy as well as Rehabilitation physician, nursing, social worker, and care management interventions.  Due to bladder management, bowel management, safety, skin/wound care, disease management, medication administration, pain management and patient education the patient requires 24 hour a day rehabilitation nursing.  The patient is currently Min A with mobility and Min/SBA basic ADLs.  Discharge setting and therapy post discharge at home with home health is anticipated.  Patient has agreed to participate in the Acute Inpatient Rehabilitation Program and will admit 08/21/18.  Preadmission Screen Completed By:  Jhonnie Garner, 08/21/2018 10:29 AM  ______________________________________________________________________   Discussed status with Dr. Posey Pronto on 08/21/18 at 8:51AM and received approval for admission today.  Admission Coordinator:  Jhonnie Garner, OT, time 10:28AM/Date 08/21/18   Assessment/Plan: Diagnosis: Left temporal ICH  1. Does the need for close, 24 hr/day Medical supervision in concert with the patient's rehab needs make it unreasonable  for this  patient to be served in a less intensive setting? Yes 2. Co-Morbidities requiring supervision/potential complications: HTN, AAA, history of ischemic stroke without deficits, Left temporal AVM, and tobacco abuse 3. Due to safety, skin/wound care, medication administration and patient education, does the patient require 24 hr/day rehab nursing? Yes 4. Does the patient require coordinated care of a physician, rehab nurse, PT (1-2 hrs/day, 5 days/week), OT (1-2 hrs/day, 5 days/week) and SLP (1-2 hrs/day, 5 days/week) to address physical and functional deficits in the context of the above medical diagnosis(es)? Yes Addressing deficits in the following areas: balance, endurance, locomotion, strength, transferring, bathing, dressing, toileting, cognition and psychosocial support 5. Can the patient actively participate in an intensive therapy program of at least 3 hrs of therapy 5 days a week? Yes 6. The potential for patient to make measurable gains while on inpatient rehab is excellent 7. Anticipated functional outcomes upon discharge from inpatients are: supervision PT, supervision OT, supervision SLP 8. Estimated rehab length of stay to reach the above functional goals is: 8-12 days. 9. Anticipated D/C setting: Home 10. Anticipated post D/C treatments: HH therapy and Home excercise program 11. Overall Rehab/Functional Prognosis: good  MD Signature Delice Lesch, MD, ABPMR

## 2018-08-16 NOTE — Progress Notes (Addendum)
Pulmonary Critical Care Medicine [place]   PULMONARY CRITICAL CARE SERVICE  PROGRESS NOTE  Date of Service: 08/16/2018  Mark [name]  [id number]  DOB: 09-20-1950   DOA: 08/02/2018  Referring Physician: Merton Border, MD  HPI: Mark [name] is a 68 y.o. male seen for follow up of Acute on Chronic Respiratory Failure.  Patient remains on aerosol treatment 28% for 48-hour goal at this time.  Satting well with no distress.    Medications: Reviewed on Rounds  Physical Exam:  Vitals: Pulse 87 respirations 12 BP 164/73 O2 sat 98% temp 96.7  Ventilator Settings ATC 28%  . General: Comfortable at this time . Eyes: Grossly normal lids, irises & conjunctiva . ENT: grossly tongue is normal . Neck: no obvious mass . Cardiovascular: S1 S2 normal no gallop . Respiratory: No rales or rhonchi noted . Abdomen: soft . Skin: no rash seen on limited exam . Musculoskeletal: not rigid . Psychiatric:unable to assess . Neurologic: no seizure no involuntary movements         Lab Data:   Basic Metabolic Panel: Recent Labs  Lab 08/10/18 0826 08/13/18 0646  NA 140 141  K 4.3 3.8  CL 101 104  CO2 30 29  GLUCOSE 115* 121*  BUN 25* 31*  CREATININE 1.03 1.12  CALCIUM 9.2 9.3  MG 2.2 2.3  PHOS 3.4  --     ABG: No results for input(s): PHART, PCO2ART, PO2ART, HCO3, O2SAT in the last 168 hours.  Liver Function Tests: No results for input(s): AST, ALT, ALKPHOS, BILITOT, PROT, ALBUMIN in the last 168 hours. No results for input(s): LIPASE, AMYLASE in the last 168 hours. No results for input(s): AMMONIA in the last 168 hours.  CBC: Recent Labs  Lab 08/10/18 0826 08/13/18 0646  WBC 10.6* 7.9  HGB 8.8* 9.5*  HCT 28.0* 30.4*  MCV 88.9 89.4  PLT 373 377    Cardiac Enzymes: No results for input(s): CKTOTAL, CKMB, CKMBINDEX, TROPONINI in the last 168 hours.  BNP (last 3 results) No results for input(s): BNP in the last 8760 hours.  ProBNP (last 3  results) No results for input(s): PROBNP in the last 8760 hours.  Radiological Exams: No results found.  Assessment/Plan Active Problems:   Acute on chronic respiratory failure with hypoxia (HCC)   Chronic kidney disease, stage III (moderate) (HCC)   Nontraumatic intracranial hemorrhage, unspecified (HCC)   Altered mental status, unspecified   1. Acute on chronic respiratory failure with hypoxiacontinue weaning per protocol goal48 hours on aerosol trach collar 28% FiO2 today. Continue supportive measures and pulmonary toilet. 2. Chronic kidney disease stage III as noted previously labs have actually been stabilized. 3. Nontraumatic intracranial hemorrhage grossly unchanged. We will continue with supportive care. 4. Possible congestive heart echocardiogram ordered follow-up chest x-ray reveals no acute pulmonary nodules.   I have personally seen and evaluated the patient, evaluated laboratory and imaging results, formulated the assessment and plan and placed orders. The Patient requires high complexity decision making for assessment and support.  Case was discussed on Rounds with the Respiratory Therapy Staff  Allyne Gee, MD [facility] Pulmonary Critical Care Medicine Sleep Medicine

## 2018-08-17 LAB — BASIC METABOLIC PANEL
Anion gap: 8 (ref 5–15)
BUN: 40 mg/dL — ABNORMAL HIGH (ref 8–23)
CO2: 30 mmol/L (ref 22–32)
Calcium: 9.3 mg/dL (ref 8.9–10.3)
Chloride: 102 mmol/L (ref 98–111)
Creatinine, Ser: 1.21 mg/dL (ref 0.61–1.24)
GFR calc Af Amer: >60 mL/min (ref >60)
GFR calc non Af Amer: >60 mL/min (ref >60)
Glucose, Bld: 141 mg/dL — ABNORMAL HIGH (ref 70–99)
Potassium: 3.9 mmol/L (ref 3.5–5.1)
Sodium: 140 mmol/L (ref 135–145)

## 2018-08-17 LAB — CBC
HCT: 30.6 % — ABNORMAL LOW (ref 39.0–52.0)
Hemoglobin: 9.4 g/dL — ABNORMAL LOW (ref 13.0–17.0)
MCH: 27.6 pg (ref 26.0–34.0)
MCHC: 30.7 g/dL (ref 30.0–36.0)
MCV: 90 fL (ref 80.0–100.0)
Platelets: 362 10*3/uL (ref 150–400)
RBC: 3.4 MIL/uL — ABNORMAL LOW (ref 4.22–5.81)
RDW: 17.6 % — ABNORMAL HIGH (ref 11.5–15.5)
WBC: 10.3 10*3/uL (ref 4.0–10.5)
nRBC: 0 % (ref 0.0–0.2)

## 2018-08-17 LAB — MAGNESIUM: Magnesium: 2.4 mg/dL (ref 1.7–2.4)

## 2018-08-17 NOTE — Progress Notes (Addendum)
Pulmonary Critical Care Medicine [place]   PULMONARY CRITICAL CARE SERVICE  PROGRESS NOTE  Date of Service: 08/17/2018  Mark [name]  DQQ:[id number]  DOB: 01/14/1951   DOA: 08/02/2018  Referring Physician: Merton Border, MD  HPI: Mark [name] is a 68 y.o. male seen for follow up of Acute on Chronic Respiratory Failure.  Patient remains on trach collar 28% FiO2 satting well with no fever or distress.  Medications: Reviewed on Rounds  Physical Exam:  Vitals: Pulse 67 respirations 24 BP 128/58 O2 sat 100% temp 97.6  Ventilator Settings ATC 28%  . General: Comfortable at this time . Eyes: Grossly normal lids, irises & conjunctiva . ENT: grossly tongue is normal . Neck: no obvious mass . Cardiovascular: S1 S2 normal no gallop . Respiratory: No rales or rhonchi noted . Abdomen: soft . Skin: no rash seen on limited exam . Musculoskeletal: not rigid . Psychiatric:unable to assess . Neurologic: no seizure no involuntary movements         Lab Data:   Basic Metabolic Panel: Recent Labs  Lab 08/13/18 0646 08/17/18 0641  NA 141 140  K 3.8 3.9  CL 104 102  CO2 29 30  GLUCOSE 121* 141*  BUN 31* 40*  CREATININE 1.12 1.21  CALCIUM 9.3 9.3  MG 2.3 2.4    ABG: No results for input(s): PHART, PCO2ART, PO2ART, HCO3, O2SAT in the last 168 hours.  Liver Function Tests: No results for input(s): AST, ALT, ALKPHOS, BILITOT, PROT, ALBUMIN in the last 168 hours. No results for input(s): LIPASE, AMYLASE in the last 168 hours. No results for input(s): AMMONIA in the last 168 hours.  CBC: Recent Labs  Lab 08/13/18 0646 08/17/18 0641  WBC 7.9 10.3  HGB 9.5* 9.4*  HCT 30.4* 30.6*  MCV 89.4 90.0  PLT 377 362    Cardiac Enzymes: No results for input(s): CKTOTAL, CKMB, CKMBINDEX, TROPONINI in the last 168 hours.  BNP (last 3 results) No results for input(s): BNP in the last 8760 hours.  ProBNP (last 3 results) No results for input(s): PROBNP  in the last 8760 hours.  Radiological Exams: No results found.  Assessment/Plan Active Problems:   Acute on chronic respiratory failure with hypoxia (HCC)   Chronic kidney disease, stage III (moderate) (HCC)   Nontraumatic intracranial hemorrhage, unspecified (HCC)   Altered mental status, unspecified   1. Acute on chronic respiratory failure with hypoxiacontinue weaning per protocol on aerosol trach collar 28% FiO2 today. Continue supportive measures and pulmonary toilet. 2. Chronic kidney disease stage III as noted previously labs have actually been stabilized. 3. Nontraumatic intracranial hemorrhage grossly unchanged. We will continue with supportive care. 4. Possible congestive heart echocardiogram ordered follow-up chest x-ray reveals no acute pulmonary nodules.   I have personally seen and evaluated the patient, evaluated laboratory and imaging results, formulated the assessment and plan and placed orders. The Patient requires high complexity decision making for assessment and support.  Case was discussed on Rounds with the Respiratory Therapy Staff  Allyne Gee, MD [facility] Pulmonary Critical Care Medicine Sleep Medicine

## 2018-08-18 NOTE — Progress Notes (Addendum)
Pulmonary Critical Care Medicine [place]   PULMONARY CRITICAL CARE SERVICE  PROGRESS NOTE  Date of Service: 08/18/2018  Mark [name]  [id number]:[id number]  DOB: Mar 15, 1950   DOA: 08/02/2018  Referring Physician: Merton Border, MD  HPI: Mark [name] is a 68 y.o. male seen for follow up of Acute on Chronic Respiratory Failure.  Continues on aerosol trach collar 28% FiO2 currently awaiting cover test with the patient can be discharged to rehab.  Medications: Reviewed on Rounds  Physical Exam:  Vitals: Pulse 86 respirations 22 BP 163/84 O2 sat 98% temp 96.3  Ventilator Settings ATC 28%  . General: Comfortable at this time . Eyes: Grossly normal lids, irises & conjunctiva . ENT: grossly tongue is normal . Neck: no obvious mass . Cardiovascular: S1 S2 normal no gallop . Respiratory: No rales or rhonchi noted . Abdomen: soft . Skin: no rash seen on limited exam . Musculoskeletal: not rigid . Psychiatric:unable to assess . Neurologic: no seizure no involuntary movements         Lab Data:   Basic Metabolic Panel: Recent Labs  Lab 08/13/18 0646 08/17/18 0641  NA 141 140  K 3.8 3.9  CL 104 102  CO2 29 30  GLUCOSE 121* 141*  BUN 31* 40*  CREATININE 1.12 1.21  CALCIUM 9.3 9.3  MG 2.3 2.4    ABG: No results for input(s): PHART, PCO2ART, PO2ART, HCO3, O2SAT in the last 168 hours.  Liver Function Tests: No results for input(s): AST, ALT, ALKPHOS, BILITOT, PROT, ALBUMIN in the last 168 hours. No results for input(s): LIPASE, AMYLASE in the last 168 hours. No results for input(s): AMMONIA in the last 168 hours.  CBC: Recent Labs  Lab 08/13/18 0646 08/17/18 0641  WBC 7.9 10.3  HGB 9.5* 9.4*  HCT 30.4* 30.6*  MCV 89.4 90.0  PLT 377 362    Cardiac Enzymes: No results for input(s): CKTOTAL, CKMB, CKMBINDEX, TROPONINI in the last 168 hours.  BNP (last 3 results) No results for input(s): BNP in the last 8760 hours.  ProBNP (last 3  results) No results for input(s): PROBNP in the last 8760 hours.  Radiological Exams: No results found.  Assessment/Plan Active Problems:   Acute on chronic respiratory failure with hypoxia (HCC)   Chronic kidney disease, stage III (moderate) (HCC)   Nontraumatic intracranial hemorrhage, unspecified (HCC)   Altered mental status, unspecified   1. Acute on chronic respiratory failure with hypoxiacontinue weaning per protocol on aerosol trach collar 28% FiO2 today. Continue supportive measures and pulmonary toilet. 2. Chronic kidney disease stage III as noted previously labs have actually been stabilized. 3. Nontraumatic intracranial hemorrhage grossly unchanged. We will continue with supportive care. 4. Possible congestive heart echocardiogram ordered follow-up chest x-ray reveals no acute pulmonary nodules.   I have personally seen and evaluated the patient, evaluated laboratory and imaging results, formulated the assessment and plan and placed orders. The Patient requires high complexity decision making for assessment and support.  Case was discussed on Rounds with the Respiratory Therapy Staff  Allyne Gee, MD [facility] Pulmonary Critical Care Medicine Sleep Medicine

## 2018-08-19 LAB — CBC
HCT: 33.8 % — ABNORMAL LOW (ref 39.0–52.0)
Hemoglobin: 10.3 g/dL — ABNORMAL LOW (ref 13.0–17.0)
MCH: 27.5 pg (ref 26.0–34.0)
MCHC: 30.5 g/dL (ref 30.0–36.0)
MCV: 90.1 fL (ref 80.0–100.0)
Platelets: 361 10*3/uL (ref 150–400)
RBC: 3.75 MIL/uL — ABNORMAL LOW (ref 4.22–5.81)
RDW: 17.3 % — ABNORMAL HIGH (ref 11.5–15.5)
WBC: 9.1 10*3/uL (ref 4.0–10.5)
nRBC: 0 % (ref 0.0–0.2)

## 2018-08-19 LAB — BASIC METABOLIC PANEL
Anion gap: 12 (ref 5–15)
BUN: 29 mg/dL — ABNORMAL HIGH (ref 8–23)
CO2: 27 mmol/L (ref 22–32)
Calcium: 9.5 mg/dL (ref 8.9–10.3)
Chloride: 103 mmol/L (ref 98–111)
Creatinine, Ser: 1.13 mg/dL (ref 0.61–1.24)
GFR calc Af Amer: >60 mL/min (ref >60)
GFR calc non Af Amer: >60 mL/min (ref >60)
Glucose, Bld: 98 mg/dL (ref 70–99)
Potassium: 3.8 mmol/L (ref 3.5–5.1)
Sodium: 142 mmol/L (ref 135–145)

## 2018-08-19 LAB — MAGNESIUM: Magnesium: 2.1 mg/dL (ref 1.7–2.4)

## 2018-08-19 LAB — PHOSPHORUS: Phosphorus: 2.6 mg/dL (ref 2.5–4.6)

## 2018-08-19 NOTE — Progress Notes (Signed)
Pulmonary Critical Care Medicine [place]   PULMONARY CRITICAL CARE SERVICE  PROGRESS NOTE  Date of Service: 08/19/2018  Mark [name]  [id number]  DOB: 05/11/1950   DOA: 08/02/2018  Referring Physician: Merton Border, MD  HPI: Mark [name] is a 68 y.o. male seen for follow up of Acute on Chronic Respiratory Failure.  Currently patient is on T collar on room air became more confused had to be restrained overnight  Medications: Reviewed on Rounds  Physical Exam:  Vitals: Temperature 97.4 pulse 81 respiratory rate 16 blood pressure 164/81 saturations 99%  Ventilator Settings currently off the ventilator on T collar room air  . General: Comfortable at this time . Eyes: Grossly normal lids, irises & conjunctiva . ENT: grossly tongue is normal . Neck: no obvious mass . Cardiovascular: S1 S2 normal no gallop . Respiratory: No rhonchi no rales are noted . Abdomen: soft . Skin: no rash seen on limited exam . Musculoskeletal: not rigid . Psychiatric:unable to assess . Neurologic: no seizure no involuntary movements         Lab Data:   Basic Metabolic Panel: Recent Labs  Lab 08/13/18 0646 08/17/18 0641 08/19/18 0741  NA 141 140 142  K 3.8 3.9 3.8  CL 104 102 103  CO2 29 30 27   GLUCOSE 121* 141* 98  BUN 31* 40* 29*  CREATININE 1.12 1.21 1.13  CALCIUM 9.3 9.3 9.5  MG 2.3 2.4 2.1  PHOS  --   --  2.6    ABG: No results for input(s): PHART, PCO2ART, PO2ART, HCO3, O2SAT in the last 168 hours.  Liver Function Tests: No results for input(s): AST, ALT, ALKPHOS, BILITOT, PROT, ALBUMIN in the last 168 hours. No results for input(s): LIPASE, AMYLASE in the last 168 hours. No results for input(s): AMMONIA in the last 168 hours.  CBC: Recent Labs  Lab 08/13/18 0646 08/17/18 0641 08/19/18 0741  WBC 7.9 10.3 9.1  HGB 9.5* 9.4* 10.3*  HCT 30.4* 30.6* 33.8*  MCV 89.4 90.0 90.1  PLT 377 362 361    Cardiac Enzymes: No results for  input(s): CKTOTAL, CKMB, CKMBINDEX, TROPONINI in the last 168 hours.  BNP (last 3 results) No results for input(s): BNP in the last 8760 hours.  ProBNP (last 3 results) No results for input(s): PROBNP in the last 8760 hours.  Radiological Exams: No results found.  Assessment/Plan Active Problems:   Acute on chronic respiratory failure with hypoxia (HCC)   Chronic kidney disease, stage III (moderate) (HCC)   Nontraumatic intracranial hemorrhage, unspecified (HCC)   Altered mental status, unspecified   1. Acute on chronic respiratory failure with hypoxia we will continue with T collar trials patient became more agitated today we will going to downsize his trach to a #4 and then try to do capping trial secretions are minimal reportedly 2. Chronic kidney disease stage III follow-up on labs 3. Nontraumatic intracranial hemorrhage patient was a bit more confused today 4. Altered mental status unchanged we will continue to follow   I have personally seen and evaluated the patient, evaluated laboratory and imaging results, formulated the assessment and plan and placed orders. The Patient requires high complexity decision making for assessment and support.  Case was discussed on Rounds with the Respiratory Therapy Staff  Allyne Gee, MD [facility] Pulmonary Critical Care Medicine Sleep Medicine

## 2018-08-20 LAB — NOVEL CORONAVIRUS, NAA (HOSP ORDER, SEND-OUT TO REF LAB; TAT 18-24 HRS): SARS-CoV-2, NAA: NOT DETECTED

## 2018-08-20 NOTE — Progress Notes (Signed)
Pulmonary Critical Care Medicine [place]   PULMONARY CRITICAL CARE SERVICE  PROGRESS NOTE  Date of Service: 08/20/2018  Mark [name]  TGG:[id number]  DOB: 14-Jan-1951   DOA: 08/02/2018  Referring Physician: Merton Border, MD  HPI: Mark [name] is a 68 y.o. male seen for follow up of Acute on Chronic Respiratory Failure.  Patient is capping right now has been on room air  Medications: Reviewed on Rounds  Physical Exam:  Vitals: Temperature 97.0 pulse 63 respiratory rate 20 blood pressure 118/76 saturations 97%  Ventilator Settings capping room air  . General: Comfortable at this time . Eyes: Grossly normal lids, irises & conjunctiva . ENT: grossly tongue is normal . Neck: no obvious mass . Cardiovascular: S1 S2 normal no gallop . Respiratory: No rhonchi no rales are noted . Abdomen: soft . Skin: no rash seen on limited exam . Musculoskeletal: not rigid . Psychiatric:unable to assess . Neurologic: no seizure no involuntary movements         Lab Data:   Basic Metabolic Panel: Recent Labs  Lab 08/17/18 0641 08/19/18 0741  NA 140 142  K 3.9 3.8  CL 102 103  CO2 30 27  GLUCOSE 141* 98  BUN 40* 29*  CREATININE 1.21 1.13  CALCIUM 9.3 9.5  MG 2.4 2.1  PHOS  --  2.6    ABG: No results for input(s): PHART, PCO2ART, PO2ART, HCO3, O2SAT in the last 168 hours.  Liver Function Tests: No results for input(s): AST, ALT, ALKPHOS, BILITOT, PROT, ALBUMIN in the last 168 hours. No results for input(s): LIPASE, AMYLASE in the last 168 hours. No results for input(s): AMMONIA in the last 168 hours.  CBC: Recent Labs  Lab 08/17/18 0641 08/19/18 0741  WBC 10.3 9.1  HGB 9.4* 10.3*  HCT 30.6* 33.8*  MCV 90.0 90.1  PLT 362 361    Cardiac Enzymes: No results for input(s): CKTOTAL, CKMB, CKMBINDEX, TROPONINI in the last 168 hours.  BNP (last 3 results) No results for input(s): BNP in the last 8760 hours.  ProBNP (last 3 results) No  results for input(s): PROBNP in the last 8760 hours.  Radiological Exams: No results found.  Assessment/Plan Active Problems:   Acute on chronic respiratory failure with hypoxia (HCC)   Chronic kidney disease, stage III (moderate) (HCC)   Nontraumatic intracranial hemorrhage, unspecified (HCC)   Altered mental status, unspecified   1. Acute on chronic respiratory failure with hypoxia patient is actually doing quite well with the capping trials hopefully work towards decannulation 2. Chronic kidney disease stage III following labs 3. Intracranial hemorrhage grossly unchanged still has confusion 4. Altered mental status to confusion is noted.  We will continue to follow   I have personally seen and evaluated the patient, evaluated laboratory and imaging results, formulated the assessment and plan and placed orders. The Patient requires high complexity decision making for assessment and support.  Case was discussed on Rounds with the Respiratory Therapy Staff  Allyne Gee, MD [facility] Pulmonary Critical Care Medicine Sleep Medicine

## 2018-08-21 ENCOUNTER — Other Ambulatory Visit: Payer: Self-pay

## 2018-08-21 ENCOUNTER — Encounter (HOSPITAL_COMMUNITY): Payer: Self-pay | Admitting: *Deleted

## 2018-08-21 ENCOUNTER — Inpatient Hospital Stay (HOSPITAL_COMMUNITY)
Admission: RE | Admit: 2018-08-21 | Discharge: 2018-09-05 | DRG: 057 | Disposition: A | Discharge status: Home Health | Payer: Medicare HMO | Source: Other Acute Inpatient Hospital | Attending: Physical Medicine & Rehabilitation | Admitting: Physical Medicine & Rehabilitation

## 2018-08-21 DIAGNOSIS — I10 Essential (primary) hypertension: Secondary | ICD-10-CM

## 2018-08-21 DIAGNOSIS — N183 Chronic kidney disease, stage 3 unspecified: Secondary | ICD-10-CM

## 2018-08-21 DIAGNOSIS — Z8249 Family history of ischemic heart disease and other diseases of the circulatory system: Secondary | ICD-10-CM

## 2018-08-21 DIAGNOSIS — I69151 Hemiplegia and hemiparesis following nontraumatic intracerebral hemorrhage affecting right dominant side: Secondary | ICD-10-CM | POA: Diagnosis not present

## 2018-08-21 DIAGNOSIS — I611 Nontraumatic intracerebral hemorrhage in hemisphere, cortical: Secondary | ICD-10-CM | POA: Diagnosis present

## 2018-08-21 DIAGNOSIS — E1122 Type 2 diabetes mellitus with diabetic chronic kidney disease: Secondary | ICD-10-CM | POA: Diagnosis present

## 2018-08-21 DIAGNOSIS — Z791 Long term (current) use of non-steroidal anti-inflammatories (NSAID): Secondary | ICD-10-CM

## 2018-08-21 DIAGNOSIS — Z881 Allergy status to other antibiotic agents status: Secondary | ICD-10-CM | POA: Diagnosis not present

## 2018-08-21 DIAGNOSIS — F17210 Nicotine dependence, cigarettes, uncomplicated: Secondary | ICD-10-CM | POA: Diagnosis present

## 2018-08-21 DIAGNOSIS — I129 Hypertensive chronic kidney disease with stage 1 through stage 4 chronic kidney disease, or unspecified chronic kidney disease: Secondary | ICD-10-CM | POA: Diagnosis present

## 2018-08-21 DIAGNOSIS — Z79899 Other long term (current) drug therapy: Secondary | ICD-10-CM | POA: Diagnosis not present

## 2018-08-21 DIAGNOSIS — E1151 Type 2 diabetes mellitus with diabetic peripheral angiopathy without gangrene: Secondary | ICD-10-CM | POA: Diagnosis present

## 2018-08-21 DIAGNOSIS — D62 Acute posthemorrhagic anemia: Secondary | ICD-10-CM

## 2018-08-21 DIAGNOSIS — R1312 Dysphagia, oropharyngeal phase: Secondary | ICD-10-CM | POA: Diagnosis not present

## 2018-08-21 DIAGNOSIS — I2510 Atherosclerotic heart disease of native coronary artery without angina pectoris: Secondary | ICD-10-CM | POA: Diagnosis present

## 2018-08-21 DIAGNOSIS — R159 Full incontinence of feces: Secondary | ICD-10-CM | POA: Diagnosis present

## 2018-08-21 DIAGNOSIS — Z931 Gastrostomy status: Secondary | ICD-10-CM

## 2018-08-21 DIAGNOSIS — I69119 Unspecified symptoms and signs involving cognitive functions following nontraumatic intracerebral hemorrhage: Secondary | ICD-10-CM | POA: Diagnosis not present

## 2018-08-21 DIAGNOSIS — R32 Unspecified urinary incontinence: Secondary | ICD-10-CM | POA: Diagnosis present

## 2018-08-21 DIAGNOSIS — R609 Edema, unspecified: Secondary | ICD-10-CM | POA: Diagnosis not present

## 2018-08-21 DIAGNOSIS — F802 Mixed receptive-expressive language disorder: Secondary | ICD-10-CM | POA: Diagnosis present

## 2018-08-21 DIAGNOSIS — Z930 Tracheostomy status: Secondary | ICD-10-CM

## 2018-08-21 DIAGNOSIS — Z7982 Long term (current) use of aspirin: Secondary | ICD-10-CM

## 2018-08-21 DIAGNOSIS — Z833 Family history of diabetes mellitus: Secondary | ICD-10-CM

## 2018-08-21 DIAGNOSIS — I69120 Aphasia following nontraumatic intracerebral hemorrhage: Secondary | ICD-10-CM | POA: Diagnosis not present

## 2018-08-21 DIAGNOSIS — E119 Type 2 diabetes mellitus without complications: Secondary | ICD-10-CM | POA: Diagnosis not present

## 2018-08-21 DIAGNOSIS — Z888 Allergy status to other drugs, medicaments and biological substances status: Secondary | ICD-10-CM | POA: Diagnosis not present

## 2018-08-21 DIAGNOSIS — G4700 Insomnia, unspecified: Secondary | ICD-10-CM | POA: Diagnosis present

## 2018-08-21 DIAGNOSIS — R4701 Aphasia: Secondary | ICD-10-CM

## 2018-08-21 DIAGNOSIS — Z431 Encounter for attention to gastrostomy: Secondary | ICD-10-CM | POA: Diagnosis not present

## 2018-08-21 LAB — GLUCOSE, CAPILLARY
Glucose-Capillary: 101 mg/dL — ABNORMAL HIGH (ref 70–99)
Glucose-Capillary: 117 mg/dL — ABNORMAL HIGH (ref 70–99)

## 2018-08-21 MED ORDER — OXYCODONE HCL 5 MG PO TABS: 5.0000 mg | ORAL_TABLET | ORAL | Status: DC | PRN

## 2018-08-21 MED ORDER — FREE WATER
100.0000 mL | Freq: Every day | Status: DC
  Administered 2018-08-21 – 2018-08-23 (×9): 100 mL

## 2018-08-21 MED ORDER — BISACODYL 10 MG RE SUPP: 10.0000 mg | Freq: Every day | RECTAL | Status: DC | PRN

## 2018-08-21 MED ORDER — HYDRALAZINE HCL 50 MG PO TABS
100.0000 mg | ORAL_TABLET | Freq: Three times a day (TID) | ORAL | Status: DC
  Administered 2018-08-21 – 2018-08-30 (×26): 100 mg
  Filled 2018-08-21 (×26): qty 2

## 2018-08-21 MED ORDER — INSULIN GLARGINE 100 UNIT/ML ~~LOC~~ SOLN
5.0000 [IU] | Freq: Every day | SUBCUTANEOUS | Status: DC
  Administered 2018-08-22 – 2018-08-29 (×8): 5 [IU] via SUBCUTANEOUS
  Filled 2018-08-21 (×8): qty 0.05

## 2018-08-21 MED ORDER — LISINOPRIL 20 MG PO TABS
30.0000 mg | ORAL_TABLET | Freq: Every day | ORAL | Status: DC
  Administered 2018-08-22 – 2018-08-26 (×5): 30 mg
  Filled 2018-08-21 (×5): qty 1

## 2018-08-21 MED ORDER — FLEET ENEMA 7-19 GM/118ML RE ENEM: 1.0000 | ENEMA | Freq: Once | RECTAL | Status: DC | PRN

## 2018-08-21 MED ORDER — ACETAMINOPHEN 325 MG PO TABS: 325.0000 mg | ORAL_TABLET | ORAL | Status: DC | PRN

## 2018-08-21 MED ORDER — GUAIFENESIN-DM 100-10 MG/5ML PO SYRP: 5.0000 mL | ORAL_SOLUTION | Freq: Four times a day (QID) | ORAL | Status: DC | PRN

## 2018-08-21 MED ORDER — PRO-STAT SUGAR FREE PO LIQD
30.0000 mL | Freq: Two times a day (BID) | ORAL | Status: DC
  Administered 2018-08-21 – 2018-08-22 (×2): 30 mL
  Filled 2018-08-21: qty 30

## 2018-08-21 MED ORDER — CLONAZEPAM 0.5 MG PO TABS
0.2500 mg | ORAL_TABLET | Freq: Two times a day (BID) | ORAL | Status: DC
  Administered 2018-08-21 – 2018-08-30 (×18): 0.25 mg
  Filled 2018-08-21 (×18): qty 1

## 2018-08-21 MED ORDER — AMLODIPINE 1 MG/ML ORAL SUSPENSION
10.0000 mg | Freq: Every day | ORAL | Status: DC
  Filled 2018-08-21: qty 10

## 2018-08-21 MED ORDER — GLUCERNA 1.2 CAL PO LIQD
1000.0000 mL | ORAL | Status: DC
  Administered 2018-08-21: 1000 mL
  Filled 2018-08-21 (×2): qty 1000

## 2018-08-21 MED ORDER — ADULT MULTIVITAMIN LIQUID CH
15.0000 mL | Freq: Every day | ORAL | Status: DC
  Administered 2018-08-22 – 2018-08-30 (×9): 15 mL
  Filled 2018-08-21 (×9): qty 15

## 2018-08-21 MED ORDER — BACID PO TABS
2.0000 | ORAL_TABLET | Freq: Three times a day (TID) | ORAL | Status: DC
  Administered 2018-08-21 – 2018-08-30 (×26): 2
  Filled 2018-08-21 (×27): qty 2

## 2018-08-21 MED ORDER — AMANTADINE HCL 50 MG/5ML PO SYRP
100.0000 mg | ORAL_SOLUTION | Freq: Every day | ORAL | Status: DC
  Administered 2018-08-22 – 2018-08-30 (×9): 100 mg
  Filled 2018-08-21 (×9): qty 10

## 2018-08-21 MED ORDER — POLYETHYLENE GLYCOL 3350 17 G PO PACK
17.0000 g | PACK | Freq: Every day | ORAL | Status: DC
  Administered 2018-08-22 – 2018-08-30 (×9): 17 g
  Filled 2018-08-21 (×9): qty 1

## 2018-08-21 MED ORDER — PROCHLORPERAZINE 25 MG RE SUPP: 12.5000 mg | Freq: Four times a day (QID) | RECTAL | Status: DC | PRN

## 2018-08-21 MED ORDER — SERTRALINE HCL 50 MG PO TABS
50.0000 mg | ORAL_TABLET | Freq: Every day | ORAL | Status: DC
  Administered 2018-08-22 – 2018-08-30 (×9): 50 mg
  Filled 2018-08-21 (×9): qty 1

## 2018-08-21 MED ORDER — VITAMIN B-1 100 MG PO TABS
100.0000 mg | ORAL_TABLET | Freq: Every day | ORAL | Status: DC
  Administered 2018-08-22 – 2018-08-30 (×9): 100 mg
  Filled 2018-08-21 (×9): qty 1

## 2018-08-21 MED ORDER — DIPHENHYDRAMINE HCL 12.5 MG/5ML PO ELIX: 12.5000 mg | ORAL_SOLUTION | Freq: Four times a day (QID) | ORAL | Status: DC | PRN

## 2018-08-21 MED ORDER — AMLODIPINE BESYLATE 10 MG PO TABS
10.0000 mg | ORAL_TABLET | Freq: Every day | ORAL | Status: DC
  Administered 2018-08-22 – 2018-08-30 (×9): 10 mg
  Filled 2018-08-21 (×9): qty 1

## 2018-08-21 MED ORDER — PROCHLORPERAZINE EDISYLATE 10 MG/2ML IJ SOLN: 5.0000 mg | Freq: Four times a day (QID) | INTRAMUSCULAR | Status: DC | PRN

## 2018-08-21 MED ORDER — FAMOTIDINE 40 MG/5ML PO SUSR
20.0000 mg | Freq: Two times a day (BID) | ORAL | Status: DC
  Administered 2018-08-21 – 2018-08-30 (×18): 20 mg
  Filled 2018-08-21 (×18): qty 2.5

## 2018-08-21 MED ORDER — LEVETIRACETAM 100 MG/ML PO SOLN
500.0000 mg | Freq: Two times a day (BID) | ORAL | Status: DC
  Administered 2018-08-21 – 2018-08-30 (×18): 500 mg
  Filled 2018-08-21 (×18): qty 5

## 2018-08-21 MED ORDER — POLYETHYLENE GLYCOL 3350 17 G PO PACK: 17.0000 g | PACK | Freq: Every day | ORAL | Status: DC | PRN

## 2018-08-21 MED ORDER — SERTRALINE HCL 20 MG/ML PO CONC
50.0000 mg | Freq: Every day | ORAL | Status: DC
  Filled 2018-08-21: qty 2.5

## 2018-08-21 MED ORDER — INSULIN ASPART 100 UNIT/ML ~~LOC~~ SOLN
0.0000 [IU] | Freq: Four times a day (QID) | SUBCUTANEOUS | Status: DC
  Administered 2018-08-22 – 2018-08-27 (×10): 1 [IU] via SUBCUTANEOUS

## 2018-08-21 MED ORDER — PROCHLORPERAZINE MALEATE 5 MG PO TABS: 5.0000 mg | ORAL_TABLET | Freq: Four times a day (QID) | ORAL | Status: DC | PRN

## 2018-08-21 MED ORDER — ALUM & MAG HYDROXIDE-SIMETH 200-200-20 MG/5ML PO SUSP: 30.0000 mL | ORAL | Status: DC | PRN

## 2018-08-21 NOTE — H&P (Addendum)
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  History taken from chart review.  He underwent left craniectomy for evacuation of hematoma after partial embolization of AVM.  He was taken back to OR on 07/18/2018 for resection of residual AVM with cranioplasty and PEG placement.  Had tracheostomy placed on 07/20/2018. He had difficulty with vent wean due to issues with agitation as well as copious secretions. He had issues with somnolence, right hemiparesis as well as aphasia. He was transferred to [facility] on 08/01/18 for medical management and vent wean. He has required Klonopin and precedex for management of agitation.  He tolerated vent wean and trach downsized to CFS # 4 with plugging on 08/20/2018. He was maintained on Keppra for seizure prophylaxis and provigil and amantadine added for activation.  He has been incontinent of bowel and bladder.  He remains NPO with ice chips and tube feeds ongoing for nutritional support.  He was showing improvement in activation tolerance with ability to follow some commands. CIR recommended by therapy team due to overall functional decline.  Please see preadmission assessment from earlier today as well.  Review of Systems  Unable to perform ROS: Patient nonverbal   Past Medical History:  Diagnosis Date   Acute on chronic respiratory failure with hypoxia (HCC)    Altered mental status, unspecified    Chronic kidney disease    CKD stage 3   Chronic kidney disease, stage III (moderate) (HCC)    Concussion    multiple in high school   Coronary artery disease    pt denies   Diabetes mellitus without complication (HCC)    borderline    Hypertension    Nontraumatic intracranial hemorrhage, unspecified (HCC)    PVD (peripheral vascular disease) (HCC)    leg stents femoral artery   Stroke ([place])    3 years ago- no residual    Past Surgical History:  Procedure Laterality Date   ABDOMINAL AORTIC ENDOVASCULAR STENT GRAFT N/A 02/01/2018   Procedure: ABDOMINAL AORTIC ENDOVASCULAR STENT GRAFT;  Surgeon: Serafina Mitchell, MD;  Location: [facility] OR;  Service: Vascular;  Laterality: N/A;   NECK SURGERY     VEIN BYPASS SURGERY      Family History  Problem Relation Age of Onset   Dementia Mother    Diabetes Mother    Heart disease Mother    Diabetes Father    Heart disease Father     Social History:  Lives alone. Independent PTA. Has two children who live OOT. Per reports that he has been smoking cigarettes. He has been smoking about 1.50 packs per day. He has never used smokeless tobacco. Per  reports he does not drink alcohol or use drugs.    Allergies  Allergen Reactions   Orajel Mouth-Aid [Carbamide Peroxide] Swelling    Swelling   Pravastatin Other (See Comments)    Pain & weakness   Rosuvastatin     UNSPECIFIED REACTION    Sudafed [Pseudoephedrine] Other (See Comments)    Pt couldn't sleep    Medications Prior to Admission  Medication Sig Dispense Refill   amLODipine (NORVASC) 10 MG tablet Take 10 mg by mouth  daily.     amLODipine (NORVASC) 10 MG tablet Take 1 tablet (10 mg total) by mouth daily. 1 tablet 0   aspirin EC 81 MG EC tablet Take 1 tablet (81 mg total) by mouth daily. 1 tablet 0   cyclobenzaprine (FLEXERIL) 10 MG tablet Take 10 mg by mouth 3 (three) times daily as needed for muscle spasms.     hydrALAZINE (APRESOLINE) 25 MG tablet Take 25 mg by mouth 2 (two) times daily.      hydrALAZINE (APRESOLINE) 25 MG tablet Take 1 tablet (25 mg total) by mouth 2 (two) times daily. 1 tablet 0   ibuprofen (ADVIL,MOTRIN) 200 MG tablet Take 400 mg by mouth every 6 (six) hours as needed for  moderate pain.      lisinopril (PRINIVIL,ZESTRIL) 40 MG tablet Take 1 tablet (40 mg total) by mouth daily. 1 tablet 0   oxyCODONE-acetaminophen (PERCOCET/ROXICET) 5-325 MG tablet Take 1 tablet by mouth every 6 (six) hours as needed for moderate pain. (Patient not taking: Reported on 03/11/2018) 12 tablet 0    Drug Regimen Review Drug regimen was reviewed and remains appropriate with no significant issues identified  Home: 2 level home with 2 STE Can stay on 1st level.    Functional History:   Functional Status:  Mobility: Min assist for bed mobility and transfers Min to mod assist to ambulate 5' with HHA and verbal cues.       ADL: Able to complete UB dressing with SBA and gestures to complete tasks. Requires min assist for LB dressing.   Cognition:      Physical Exam: There were no vitals taken for this visit. Physical Exam  Nursing note and vitals reviewed. Constitutional: He appears well-developed.  Frail  HENT:  Head: Macrocephalic.  Mild scalp crani incision C/D/I  Eyes: EOM are normal. Right eye exhibits no discharge. Left eye exhibits no discharge.  Neck:  Plugged trach in place.   Respiratory: Effort normal. No stridor. No respiratory distress.  GI: He exhibits no distension.  Musculoskeletal:     Comments: No edema or tenderness in extremities  Neurological: He is alert.  Right inattenion?  He did makes eye contact but was unable to follow simple motor commands with visual and tactile cues. Motor: Limited due to ability to follow commands, however moves all extremities spontaneously.  Global aphasia  Skin: Skin is warm and dry.  Psychiatric:  Unable to assess due to aphasia    No results found for this or any previous visit (from the past 48 hour(s)). No results found.     Medical Problem List and Plan: 1.  Deficits with mobility, self-care, attention, nutrition, cognition, swallowing secondary to left temporal ICH  Admit to CIR 2.   Antithrombotics: -DVT/anticoagulation:  Mechanical: Sequential compression devices, below knee Bilateral lower extremities   Lower extremity Dopplers ordered  -antiplatelet therapy: N/A 3. Pain Management: Oxycodone prn.  4. Mood: LCSW to follow for evaluation and support when appropriate. Continue Zoloft and Klonopin for mood stabilization.   -antipsychotic agents:  n/A 5. Neuropsych: This patient is not capable of making decisions on his own behalf. Continues to have bouts of agitation per reports.  6. Skin/Wound Care: Routine pressure relief measures. Daily PEG and trach care.  7. Fluids/Electrolytes/Nutrition: Monitor I/O.  CMP ordered for tomorrow a.m. 8.  HTN: Monitor BP tid- continue amlodipine, hydralazine and lisinopril.  Monitor with increased mobility. 9. T2DM: Diet controlled-Hgb A1c- 5.5 at admission. Will monitor BS ac/hs. Continue Glucerna tube feeds  with Lantus daily and SSI qid prn elevated BS.  Monitor with increased mobility. 10. ABLA: Monitor for signs of bleeding. Slowly improving.  CBC ordered for tomorrow a.m. 11. CKD stage III: Continue to monitor with serial checks.  CMP ordered for tomorrow a.m.  Post Admission Physician Evaluation: 1. Preadmission assessment reviewed and changes made below. 2. Functional deficits secondary  to left temporal ICH. 3. Patient is admitted to receive collaborative, interdisciplinary care between the physiatrist, rehab nursing staff, and therapy team. 4. Patient's level of medical complexity and substantial therapy needs in context of that medical necessity cannot be provided at a lesser intensity of care such as a SNF. 5. Patient has experienced substantial functional loss from his/her baseline which was documented above under the "Functional History" and "Functional Status" headings.  Judging by the patient's diagnosis, physical exam, and functional history, the patient has potential for functional progress which will result in measurable  gains while on inpatient rehab.  These gains will be of substantial and practical use upon discharge  in facilitating mobility and self-care at the household level. 6. Physiatrist will provide 24 hour management of medical needs as well as oversight of the therapy plan/treatment and provide guidance as appropriate regarding the interaction of the two. 7. 24 hour rehab nursing will assist with bladder management, bowel management, safety, skin/wound care, disease management, medication administration and patient education  and help integrate therapy concepts, techniques,education, etc. 8. PT will assess and treat for/with: Lower extremity strength, range of motion, stamina, balance, functional mobility, safety, adaptive techniques and equipment, wound care, coping skills, pain control, stroke education. Goals are: Supervision. 9. OT will assess and treat for/with: ADL's, functional mobility, safety, upper extremity strength, adaptive techniques and equipment, wound mgt, ego support, and community reintegration.   Goals are: Supervision. Therapy may not proceed with showering this patient. 10. SLP will assess and treat for/with: Swallowing, language, cognition.  Goals are: Supervision/min A. 11. Case Management and Social Worker will assess and treat for psychological issues and discharge planning. 12. Team conference will be held weekly to assess progress toward goals and to determine barriers to discharge. 13. Patient will receive at least 3 hours of therapy per day at least 5 days per week. 14. ELOS: 8-12 days.       15. Prognosis:  good  I have personally performed a face to face diagnostic evaluation, including, but not limited to relevant history and physical exam findings, of this patient and developed relevant assessment and plan.  Additionally, I have reviewed and concur with the physician assistant's documentation above.  Maryla Morrow, MD, ABPMR Jacquelynn Cree, PA-C 08/21/2018

## 2018-08-21 NOTE — H&P (Signed)
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  History taken from chart review.  He underwent left craniectomy for evacuation of hematoma after partial embolization of AVM.  He was taken back to OR on 07/18/2018 for resection of residual AVM with cranioplasty and PEG placement.  Had tracheostomy placed on 07/20/2018. He had difficulty with vent wean due to issues with agitation as well as copious secretions. He had issues with somnolence, right hemiparesis as well as aphasia. He was [facility] on 08/01/18 for medical management and vent wean. He has required Klonopin and precedex for management of agitation.  He tolerated vent wean and trach downsized to CFS # 4 with plugging on 08/20/2018. He was maintained on Keppra for seizure prophylaxis and provigil and amantadine added for activation.  He has been incontinent of bowel and bladder.  He remains NPO with ice chips and tube feeds ongoing for nutritional support.  He was showing improvement in activation tolerance with ability to follow some commands. CIR recommended by therapy team due to overall functional decline.   Review of Systems  Unable to perform ROS: Patient nonverbal   Past Medical History:  Diagnosis Date  . Acute on chronic respiratory failure with hypoxia (HCC)   . Altered mental status, unspecified   . Chronic kidney disease    CKD stage 3  . Chronic kidney disease, stage III (moderate) (HCC)   . Concussion    multiple in high school  . Coronary artery disease    pt denies  . Diabetes mellitus without complication (HCC)    borderline  . Hypertension   . Nontraumatic intracranial hemorrhage, unspecified  (HCC)   . PVD (peripheral vascular disease) (HCC)    leg stents femoral artery  . Stroke [facility](HCC)    3 years ago- no residual    Past Surgical History:  Procedure Laterality Date  . ABDOMINAL AORTIC ENDOVASCULAR STENT GRAFT N/A 02/01/2018   Procedure: ABDOMINAL AORTIC ENDOVASCULAR STENT GRAFT;  Surgeon: Nada LibmanBrabham, Vance W, MD;  Location: [facility] OR;  Service: Vascular;  Laterality: N/A;  . NECK SURGERY    . VEIN BYPASS SURGERY      Family History  Problem Relation Age of Onset  . Dementia Mother   . Diabetes Mother   . Heart disease Mother   . Diabetes Father   . Heart disease Father     Social History:  Lives alone. Independent PTA. Has two children who live OOT. Per reports that he has been smoking cigarettes. He has been smoking about 1.50 packs per day. He has never used smokeless tobacco. Per  reports he does not drink alcohol or use drugs.    Allergies  Allergen Reactions  . Orajel Mouth-Aid [Carbamide Peroxide] Swelling    Swelling  . Pravastatin Other (See Comments)    Pain & weakness  . Rosuvastatin     UNSPECIFIED REACTION   . Sudafed [Pseudoephedrine] Other (See Comments)    Pt couldn't sleep    Medications Prior to Admission  Medication Sig Dispense Refill  . amLODipine (NORVASC) 10 MG tablet Take 10 mg by mouth daily.    Marland Kitchen. amLODipine (NORVASC) 10 MG  tablet Take 1 tablet (10 mg total) by mouth daily. 1 tablet 0  . aspirin EC 81 MG EC tablet Take 1 tablet (81 mg total) by mouth daily. 1 tablet 0  . cyclobenzaprine (FLEXERIL) 10 MG tablet Take 10 mg by mouth 3 (three) times daily as needed for muscle spasms.    . hydrALAZINE (APRESOLINE) 25 MG tablet Take 25 mg by mouth 2 (two) times daily.     . hydrALAZINE (APRESOLINE) 25 MG tablet Take 1 tablet (25 mg total) by mouth 2 (two) times daily. 1 tablet 0  . ibuprofen (ADVIL,MOTRIN) 200 MG tablet Take 400 mg by mouth every 6 (six) hours as needed for moderate pain.     Marland Kitchen lisinopril (PRINIVIL,ZESTRIL) 40 MG tablet Take 1  tablet (40 mg total) by mouth daily. 1 tablet 0  . oxyCODONE-acetaminophen (PERCOCET/ROXICET) 5-325 MG tablet Take 1 tablet by mouth every 6 (six) hours as needed for moderate pain. (Patient not taking: Reported on 03/11/2018) 12 tablet 0    Drug Regimen Review   Home: 2 level home with 2 STE Can stay on 1st level.    Functional History:   Functional Status:  Mobility: Min assist for bed mobility and transfers Min to mod assist to ambulate 5' with HHA and verbal cues.       ADL: Able to complete UB dressing with SBA and gestures to complete tasks. Requires min assist for LB dressing.   Cognition:      Physical Exam: There were no vitals taken for this visit. Physical Exam  Nursing note and vitals reviewed. Constitutional: He appears well-developed.  Frail  HENT:  Head: Macrocephalic.  Mild scalp crani incision C/D/I  Eyes: EOM are normal. Right eye exhibits no discharge. Left eye exhibits no discharge.  Neck:  Plugged trach in place.   Respiratory: Effort normal. No stridor. No respiratory distress.  GI: He exhibits no distension.  Musculoskeletal:     Comments: No edema or tenderness in extremities  Neurological: He is alert.  Right inattenion?  He did makes eye contact but was unable to follow simple motor commands with visual and tactile cues. Motor: Limited due to ability to follow commands, however moves all extremities spontaneously.  Global aphasia  Skin: Skin is warm and dry.  Psychiatric:  Unable to assess due to aphasia    No results found for this or any previous visit (from the past 48 hour(s)). No results found.     Medical Problem List and Plan: 1.  Deficits with mobility, self-care, attention, nutrition, cognition, swallowing secondary to left temporal ICH  Admit to CIR 2.  Antithrombotics: -DVT/anticoagulation:  Mechanical: Sequential compression devices, below knee Bilateral lower extremities   Lower extremity Dopplers ordered   -antiplatelet therapy: N/A 3. Pain Management: Oxycodone prn.  4. Mood: LCSW to follow for evaluation and support when appropriate. Continue Zoloft and Klonopin for mood stabilization.   -antipsychotic agents:  n/A 5. Neuropsych: This patient is not capable of making decisions on his own behalf. Continues to have bouts of agitation per reports.  6. Skin/Wound Care: Routine pressure relief measures. Daily PEG and trach care.  7. Fluids/Electrolytes/Nutrition: Monitor I/O.  CMP ordered for tomorrow a.m. 8.  HTN: Monitor BP tid- continue amlodipine, hydralazine and lisinopril.  Monitor with increased mobility. 9. T2DM: Diet controlled-Hgb A1c- 5.5 at admission. Will monitor BS ac/hs. Continue Glucerna tube feeds with Lantus daily and SSI qid prn elevated BS.  Monitor with increased mobility. 10. ABLA: Monitor for signs of  bleeding. Slowly improving.  CBC ordered for tomorrow a.m. 11. CKD stage III: Continue to monitor with serial checks.  CMP ordered for tomorrow a.m.  Jacquelynn Cree, PA-C 08/21/2018

## 2018-08-21 NOTE — Progress Notes (Signed)
Patient ID: Mark [name], male   DOB: Oct 16, 1950, 68 y.o.   MRN: [id number] Patient arrived from [facility] with patient belongings. Patient oriented to room, rehab process, and rehab schedule. RRT assessed trach and ordered necessary items for patient's trach. Patient resting comfortably in bed, call bell at side, and no complaints of pain.

## 2018-08-21 NOTE — Progress Notes (Signed)
Jhonnie Garner, OT  Rehab Admission Coordinator  Physical Medicine and Rehabilitation  PMR Pre-admission  Signed  Encounter Date:  08/16/2018      Related encounter: Documentation from 08/16/2018 in [place]         PMR Admission Coordinator Pre-Admission Assessment  Patient: Mark [name] is an 68 y.o., male MRN: [id number] DOB: 09/12/1950 Height: '5\' 6"'$  (1.676 m) Weight: 59.9 kg  Insurance Information HMO:     PPO: yes     PCP:      IPA:      80/20:      OTHER:  PRIMARY: [employer] Medicare      Policy#: [id number]      Subscriber: patient CM NameLarene Beach     Phone#: [phone]     Fax#: [phone] Pre-Cert#: [id number]      Employer:  Josem Kaufmann [id number] provided by Larene Beach for admit to CIR. Pt is approved for 7 days. With review due 6/30. F/u clinicals are due to [employer] (p): [phone] ([phone]  Benefits:  Phone #: online     Name: [email or web address] Eff. Date: 02/27/18 still active     Deduct: NA      Out of Pocket Max: $4,200 ($345.00 met)      Life Max: NA CIR: $250/day co pay with a max co-pay of $1,500/admission      SNF: $0/day co pay for dayas 1-20, $178/day co pay for days 21-100; limited to 100 days Outpatient: limited by medical necessity     Co-Pay: $35/visit co-pay Home Health: 100%, limited by medical necessity     Co-Pay: 0% DME: 80%     Co-Pay: 20% Providers:  SECONDARY:       Policy#:       Subscriber:  CM Name:       Phone#:      Fax#:  Pre-Cert#:       Employer:  Benefits:  Phone #:      Name:  Eff. Date:      Deduct:       Out of Pocket Max:       Life Max:  CIR:       SNF:  Outpatient:      Co-Pay:  Home Health:       Co-Pay:  DME:      Co-Pay:   Medicaid Application Date:       Case Manager:  Disability Application Date:       Case Worker:   The "Data Collection Information Summary" for patients in Inpatient Rehabilitation Facilities with attached "Privacy Act  [place] Records" was provided and verbally reviewed with: Family  Emergency Contact Information         Contact Information    Name Relation Home Work [place] Daughter [phone]  [phone]      Current Medical History  Patient Admitting Diagnosis: 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   History of Present Illness: Pt is a 68 yo M with past medical history significant for HTN, AAA, history of ischemic stroke without deficits, Left temporal AVM, and tobacco abuse. Pt presented to an outside hospital due to right hemiparesi. Workup revealed left temporal intracerebral hemorrhage related to an AVM rupture. Pt required multiple procedures, including left craniectomy with hematoma evacuation, partial AVM resection, partial embolization of the AVM and resection of the residual AVM with cranioplasty on 5/26; Pt underwent bone flap/subdural drain placement on 5/27. Pt requried ventilator  support, tracheostomy on 5/20 and PEG placement on 5/23. The pt has been able to tolerate weaning from the vent with pt sating on RA with size 4 cuffless trach; pt has been capped >24 hours. He does continue to have dense receptive aphasia with mentation slowing improving. Pt remains on tube feeds currently. Pt is to be admitted to CIR on 08/21/18.     Patient's medical record from [facility] has been reviewed by the rehabilitation admission coordinator and physician.  Past Medical History      Past Medical History:  Diagnosis Date  . Acute on chronic respiratory failure with hypoxia ([place])   . Altered mental status, unspecified   . Chronic kidney disease    CKD stage 3  . Chronic kidney disease, stage III (moderate) (HCC)   . Concussion    multiple in high school  . Coronary artery disease    pt denies  . Diabetes mellitus without complication (HCC)    borderline  . Hypertension   . Nontraumatic intracranial  hemorrhage, unspecified ([place])   . PVD (peripheral vascular disease) (HCC)    leg stents femoral artery  . Stroke [facility])    3 years ago- no residual    Family History   family history includes Dementia in his mother; Diabetes in his father and mother; Heart disease in his father and mother.  Prior Rehab/Hospitalizations Has the patient had prior rehab or hospitalizations prior to admission? Yes  Has the patient had major surgery during 100 days prior to admission? Yes             Current Medications No current outpatient medications on file.  Patients Current Diet:     Diet Order    None      Precautions / Restrictions     Has the patient had 2 or more falls or a fall with injury in the past year? No  Prior Activity Level    Prior Functional Level Self Care: Did the patient need help bathing, dressing, using the toilet or eating? Independent  Indoor Mobility: Did the patient need assistance with walking from room to room (with or without device)? Independent  Stairs: Did the patient need assistance with internal or external stairs (with or without device)? Independent  Functional Cognition: Did the patient need help planning regular tasks such as shopping or remembering to take medications? Independent  Home Assistive Devices / Equipment    Prior Device Use: Indicate devices/aids used by the patient prior to current illness, exacerbation or injury? None of the above   **Due to illegibility of written notes by therapists, [facility] has spoken with PT and OT on 6/22 for verbal functional level assessment, represented in "Current functional level" below:    Prior Functional Level Current Functional Level  Bed Mobility  Independent  Min A   Transfers  Independent   Min A   Mobility - Walk/Wheelchair  Independent  Min/Mod A 5 feet with HHA and verbal cues    Upper Body Dressing  Independent   SBA; use of gestures for guidance    Lower Body Dressing  Independent   Min A   Grooming  Independent   SBA   Eating/Drinking  Independent   NPO   Toilet Transfer  Independent   Min A with RW   Bladder Continence   continent   incontinent   Bowel Management  continent   incontinent   Stair Climbing   Independent   NT   Communication  WNL   aphasic   Memory  WNL  (difficult to assess due to aphasia based on clinical judgement by [facility])   Driving   Independent     Special needs/care consideration BiPAP/CPAP : no CPM : no Continuous Drip [name] : tube feeds Dialysis : no        Days : no Life Vest : no Oxygen : RA Special Bed : NA Trach Size : 4 cuffless (trach capped) Wound Vac (area) : no      Location : no Skin : cranial wound s/p removal of 46 staples; right foot 2nd toe scab to dorsum of foot, cyst to L lateral malleolus; PEG tube placed, tracheostomy   Bowel mgmt: incontinent, last BM: 6/23 Bladder mgmt: incontinent Diabetic mgmt: no Behavioral consideration : restless; attempts to get OOB; wrist restrains on 6/24 Chemo/radiation : no   Previous Home Environment (from acute therapy documentation)  Pt lives alone in a 2 story house with two steps to enter his home (no railing). Pt has a tub shower, standard commode and his bathroom is RW accessible per his grown children.   Discharge Living Setting  Pt is to DC back to his house (2 story home but can live on first floor per family). Pt will need to negotiate approx 2 steps to enter and can utilize tub shower on main floor. Pt will have 24/7 S from children (daughter lives in [place] and son in [place]) + hired help.   Social/Family/Support Systems   Pt has son Morain): [phone] & duaghter Thomes Dinning): [phone] who live is [place] and [place] respectively. They are currently switching off staying at his house while he is in the hospital. They have flexible jobs and can work from home (they plan to be the  24/7 A at home) for extended period of time. They are open to using hired help as needed but will need suggestions for [place]. They are very supportive and involved in his care. Will need training on trach care and PEG feeds prior to DC as they will be main supports.   Goals/Additional Needs  PT/OT: PT: Supervision; OT: Supervision; SLP: Min A  Estimated Length of Stay: 10-14 days   Decrease burden of Care through IP rehab admission: NA  Possible need for SNF placement upon discharge: Not anticipated; pt has 24/7 A at DC from family with family willing to hire assistance as needed; family very involved in care and with training and CIR should be able to take pt home at DC.   Patient Condition: I have reviewed medical records from [facility], spoken with CSW, RN, PT, OT, and RT, and patient, son and daughter. I met with patient at the bedside for inpatient rehabilitation assessment.  Patient will benefit from ongoing PT, OT and SLP, can actively participate in 3 hours of therapy a day 5 days of the week, and can make measurable gains during the admission.  Patient will also benefit from the coordinated team approach during an Inpatient Acute Rehabilitation admission.  The patient will receive intensive therapy as well as Rehabilitation physician, nursing, social worker, and care management interventions.  Due to bladder management, bowel management, safety, skin/wound care, disease management, medication administration, pain management and patient education the patient requires 24 hour a day rehabilitation nursing.  The patient is currently Min A with mobility and Min/SBA basic ADLs.  Discharge setting and therapy post discharge at home with home health is anticipated.  Patient has agreed to participate in the  Acute Inpatient Rehabilitation Program and will admit 08/21/18.  Preadmission Screen Completed By:  Jhonnie Garner, 08/21/2018 10:29 AM   ______________________________________________________________________   Discussed status with Dr. Posey Pronto on 08/21/18 at 8:51AM and received approval for admission today.  Admission Coordinator:  Jhonnie Garner, OT, time 10:28AM/Date 08/21/18   Assessment/Plan: Diagnosis: Left temporal ICH  1. Does the need for close, 24 hr/day Medical supervision in concert with the patient's rehab needs make it unreasonable for this patient to be served in a less intensive setting? Yes 2. Co-Morbidities requiring supervision/potential complications: HTN, AAA, history of ischemic stroke without deficits, Left temporal AVM, and tobacco abuse 3. Due to safety, skin/wound care, medication administration and patient education, does the patient require 24 hr/day rehab nursing? Yes 4. Does the patient require coordinated care of a physician, rehab nurse, PT (1-2 hrs/day, 5 days/week), OT (1-2 hrs/day, 5 days/week) and SLP (1-2 hrs/day, 5 days/week) to address physical and functional deficits in the context of the above medical diagnosis(es)? Yes Addressing deficits in the following areas: balance, endurance, locomotion, strength, transferring, bathing, dressing, toileting, cognition and psychosocial support 5. Can the patient actively participate in an intensive therapy program of at least 3 hrs of therapy 5 days a week? Yes 6. The potential for patient to make measurable gains while on inpatient rehab is excellent 7. Anticipated functional outcomes upon discharge from inpatients are: supervision PT, supervision OT, supervision SLP 8. Estimated rehab length of stay to reach the above functional goals is: 8-12 days. 9. Anticipated D/C setting: Home 10. Anticipated post D/C treatments: HH therapy and Home excercise program 11. Overall Rehab/Functional Prognosis: good  MD Signature Delice Lesch, MD, ABPMR         Revision History Date/Time User Provider Type Action  08/21/2018 11:33 AM Jamse Arn, MD  Physician Sign  08/21/2018 10:35 AM Jhonnie Garner, OT Rehab Admission Coordinator Share  View Details Report

## 2018-08-22 ENCOUNTER — Inpatient Hospital Stay (HOSPITAL_COMMUNITY): Payer: Medicare HMO | Admitting: Speech Pathology

## 2018-08-22 ENCOUNTER — Inpatient Hospital Stay (HOSPITAL_COMMUNITY): Payer: Medicare HMO

## 2018-08-22 DIAGNOSIS — R609 Edema, unspecified: Secondary | ICD-10-CM

## 2018-08-22 DIAGNOSIS — R1312 Dysphagia, oropharyngeal phase: Secondary | ICD-10-CM

## 2018-08-22 LAB — CBC WITH DIFFERENTIAL/PLATELET
Abs Immature Granulocytes: 0.02 10*3/uL (ref 0.00–0.07)
Basophils Absolute: 0.1 10*3/uL (ref 0.0–0.1)
Basophils Relative: 1 %
Eosinophils Absolute: 0.3 10*3/uL (ref 0.0–0.5)
Eosinophils Relative: 3 %
HCT: 33.3 % — ABNORMAL LOW (ref 39.0–52.0)
Hemoglobin: 10.6 g/dL — ABNORMAL LOW (ref 13.0–17.0)
Immature Granulocytes: 0 %
Lymphocytes Relative: 26 %
Lymphs Abs: 2.1 10*3/uL (ref 0.7–4.0)
MCH: 27.8 pg (ref 26.0–34.0)
MCHC: 31.8 g/dL (ref 30.0–36.0)
MCV: 87.4 fL (ref 80.0–100.0)
Monocytes Absolute: 1.1 10*3/uL — ABNORMAL HIGH (ref 0.1–1.0)
Monocytes Relative: 14 %
Neutro Abs: 4.5 10*3/uL (ref 1.7–7.7)
Neutrophils Relative %: 56 %
Platelets: 337 10*3/uL (ref 150–400)
RBC: 3.81 MIL/uL — ABNORMAL LOW (ref 4.22–5.81)
RDW: 17.5 % — ABNORMAL HIGH (ref 11.5–15.5)
WBC: 8.1 10*3/uL (ref 4.0–10.5)
nRBC: 0 % (ref 0.0–0.2)

## 2018-08-22 LAB — COMPREHENSIVE METABOLIC PANEL
ALT: 36 U/L (ref 0–44)
AST: 24 U/L (ref 15–41)
Albumin: 2.9 g/dL — ABNORMAL LOW (ref 3.5–5.0)
Alkaline Phosphatase: 133 U/L — ABNORMAL HIGH (ref 38–126)
Anion gap: 8 (ref 5–15)
BUN: 24 mg/dL — ABNORMAL HIGH (ref 8–23)
CO2: 27 mmol/L (ref 22–32)
Calcium: 9.2 mg/dL (ref 8.9–10.3)
Chloride: 104 mmol/L (ref 98–111)
Creatinine, Ser: 1.23 mg/dL (ref 0.61–1.24)
GFR calc Af Amer: >60 mL/min (ref >60)
GFR calc non Af Amer: 59 mL/min — ABNORMAL LOW (ref >60)
Glucose, Bld: 126 mg/dL — ABNORMAL HIGH (ref 70–99)
Potassium: 3.8 mmol/L (ref 3.5–5.1)
Sodium: 139 mmol/L (ref 135–145)
Total Bilirubin: 0.4 mg/dL (ref 0.3–1.2)
Total Protein: 6.4 g/dL — ABNORMAL LOW (ref 6.5–8.1)

## 2018-08-22 LAB — GLUCOSE, CAPILLARY
Glucose-Capillary: 124 mg/dL — ABNORMAL HIGH (ref 70–99)
Glucose-Capillary: 105 mg/dL — ABNORMAL HIGH (ref 70–99)
Glucose-Capillary: 149 mg/dL — ABNORMAL HIGH (ref 70–99)

## 2018-08-22 MED ORDER — GLUCERNA 1.2 CAL PO LIQD
1000.0000 mL | ORAL | Status: DC
  Filled 2018-08-22: qty 1000

## 2018-08-22 MED ORDER — ACETAMINOPHEN 325 MG PO TABS
325.0000 mg | ORAL_TABLET | ORAL | Status: DC | PRN
  Administered 2018-08-24: 650 mg

## 2018-08-22 MED ORDER — OXYCODONE HCL 5 MG PO TABS: 5.0000 mg | ORAL_TABLET | ORAL | Status: DC | PRN

## 2018-08-22 MED ORDER — ACETAMINOPHEN 325 MG PO TABS: 325.0000 mg | ORAL_TABLET | ORAL | Status: AC | PRN

## 2018-08-22 MED ORDER — GLUCERNA 1.2 CAL PO LIQD
1000.0000 mL | ORAL | Status: DC
  Administered 2018-08-22 – 2018-08-23 (×2): 1000 mL
  Filled 2018-08-22 (×3): qty 1000

## 2018-08-22 NOTE — Evaluation (Signed)
Occupational Therapy Assessment and Plan  Patient Details  Name: Mark [name] MRN: [id number] Date of Birth: 21-Dec-1950  OT Diagnosis: abnormal posture, apraxia, cognitive deficits, hemiplegia affecting dominant side and muscle weakness (generalized) Rehab Potential:   ELOS: 10-12   Today's Date: 08/22/2018 OT Individual Time: 9833-8250 OT Individual Time Calculation (min): 75 min     Problem List:  Patient Active Problem List   Diagnosis Date Noted  . Left temporal lobe hemorrhage ([place]) 08/21/2018  . CKD (chronic kidney disease), stage III ([place])   . Acute blood loss anemia   . Diabetes mellitus type 2 in nonobese (HCC)   . Essential hypertension   . Global aphasia   . Acute on chronic respiratory failure with hypoxia ([place])   . Chronic kidney disease, stage III (moderate) (HCC)   . Nontraumatic intracranial hemorrhage, unspecified ([place])   . Altered mental status, unspecified   . S/P AAA repair 02/01/2018    Past Medical History:  Past Medical History:  Diagnosis Date  . Acute on chronic respiratory failure with hypoxia ([place])   . Altered mental status, unspecified   . Chronic kidney disease    CKD stage 3  . Chronic kidney disease, stage III (moderate) (HCC)   . Concussion    multiple in high school  . Coronary artery disease    pt denies  . Diabetes mellitus without complication (HCC)    borderline  . Hypertension   . Nontraumatic intracranial hemorrhage, unspecified ([place])   . PVD (peripheral vascular disease) (HCC)    leg stents femoral artery  . Stroke [facility])    3 years ago- no residual   Past Surgical History:  Past Surgical History:  Procedure Laterality Date  . ABDOMINAL AORTIC ENDOVASCULAR STENT GRAFT N/A 02/01/2018   Procedure: ABDOMINAL AORTIC ENDOVASCULAR STENT GRAFT;  Surgeon: Serafina Mitchell, MD;  Location: [facility];  Service: Vascular;  Laterality: N/A;  . NECK SURGERY    . VEIN BYPASS SURGERY      Assessment & Plan Clinical Impression: Mark [name] IV is a 68 year old male with history of HTN, AAA, ASCVD, CVA with diagnosis of left temporal AVM  who was admitted to [facility] on 07/05/2018 with right sided weakness due to left temporal hemorrhage related to AVM rupture.  History taken from chart review.  He underwent left craniectomy for evacuation of hematoma after partial embolization of AVM.  He was taken back to OR on 07/18/2018 for resection of residual AVM with cranioplasty and PEG placement.  Had tracheostomy placed on 07/20/2018. He had difficulty with vent wean due to issues with agitation as well as copious secretions. He had issues with somnolence, right hemiparesis as well as aphasia. He was transferred to [facility] on 08/01/18 for medical management and vent wean. He has required Klonopin and precedex for management of agitation.  He tolerated vent wean and trach downsized to CFS # 4 with plugging on 08/20/2018. He was maintained on Keppra for seizure prophylaxis and provigil and amantadine added for activation.  He has been incontinent of bowel and bladder.  He remains NPO with ice chips and tube feeds ongoing for nutritional support.  He was showing improvement in activation tolerance with ability to follow some commands. CIR recommended by therapy team due to overall functional decline.  Please see preadmission assessment from earlier today as well.  Patient currently requires min with basic self-care skills secondary to muscle weakness, decreased cardiorespiratoy endurance, motor apraxia, decreased coordination and decreased motor  planning, field cut, decreased attention to right, decreased attention, decreased awareness, decreased problem solving, decreased safety awareness, decreased memory and delayed processing and decreased sitting balance, decreased standing balance, decreased postural control, hemiplegia and decreased balance strategies.  Prior to hospitalization, patient could complete BADL/IADL with independent  .  Patient will benefit from skilled intervention to increase independence with basic self-care skills prior to discharge home with care partner.  Anticipate patient will require 24 hour supervision and follow up home health.  OT - End of Session Activity Tolerance: Tolerates 30+ min activity with multiple rests Endurance Deficit: Yes OT Assessment Rehab Potential (ACUTE ONLY): Good OT Barriers to Discharge: Decreased caregiver support;Lack of/limited family support;Trach;Incontinence OT Patient demonstrates impairments in the following area(s): Balance;Cognition;Endurance;Motor;Pain;Nutrition;Perception;Safety;Sensory;Vision OT Basic ADL's Functional Problem(s): Eating;Grooming;Bathing;Dressing;Toileting OT Transfers Functional Problem(s): Toilet;Tub/Shower OT Additional Impairment(s): Fuctional Use of Upper Extremity OT Plan OT Intensity: Minimum of 1-2 x/day, 45 to 90 minutes OT Frequency: 5 out of 7 days OT Duration/Estimated Length of Stay: 10-12 OT Treatment/Interventions: Balance/vestibular training;Discharge planning;Pain management;Self Care/advanced ADL retraining;Therapeutic Activities;UE/LE Coordination activities;Cognitive remediation/compensation;Functional mobility training;Disease mangement/prevention;Patient/family education;Skin care/wound managment;Therapeutic Exercise;Visual/perceptual remediation/compensation;Community reintegration;DME/adaptive equipment instruction;Neuromuscular re-education;Psychosocial support;Splinting/orthotics;UE/LE Strength taining/ROM;Wheelchair propulsion/positioning OT Self Feeding Anticipated Outcome(s): S OT Basic Self-Care Anticipated Outcome(s): S OT Toileting Anticipated Outcome(s): S OT Bathroom Transfers Anticipated Outcome(s): S OT Recommendation Patient destination: Home Follow Up Recommendations: Home health OT Equipment Recommended: 3 in 1 bedside comode;Tub/shower bench   Skilled Therapeutic Intervention 1;1. Pt received in  bed and educated on OT role/purpose, CIR, ELOS and POC. Pt demo difficulty following commands but benefitted from demonstration d/t global aphaisa. Supervision for UB ADLs provided at sink and MIN A for sit to stand/stand pivot transfers without AD. MIN A for donnin pants and mod A for socks and shoes. Pt able to carry over cue to fasten second shoe lace. Tactile cues provided for upright posture in standing. Pt demo R inattention and potential field cut at sink and dynavision as well as undershooting for objects. Pt completes dynavision 2 min intervals average 3.7 to locate stimulus on R and 10.8 on L. Pt completes box and blocks with 18 using LE and 10 blocks on RUE. Exited sesssion with direct handoff to SLP.  OT Evaluation Precautions/Restrictions  Precautions Precaution Comments: recent craioplasty; PEG; trach  Restrictions Weight Bearing Restrictions: No General Chart Reviewed: Yes Family/Caregiver Present: No Vital Signs Therapy Vitals Temp: 98 F (36.7 C) Temp Source: Oral Pulse Rate: 68 Resp: 16 BP: (!) 156/67 Patient Position (if appropriate): Lying Oxygen Therapy SpO2: 94 % O2 Device: Room Air Pain Pain Assessment Pain Score: (unable to report d/t aphasia) Home Living/Prior [place] expects to be discharged to:: Private residence Living Arrangements: Spouse/significant other, Children Available Help at Discharge: Family, Available 24 hours/day, Other (Comment)(plans to hire A PRN) Type of Home: House Home Access: Stairs to enter [employer] of Steps: 2 Entrance Stairs-Rails: None Home Layout: Two level, Able to live on main level with bedroom/bathroom Alternate Level Stairs-Rails: None Bathroom Shower/Tub: Chiropodist: Standard Bathroom Accessibility: Yes ADL   Vision Baseline Vision/History: Wears glasses Perception  Perception: Impaired(mild R inattention) Praxis Praxis: Impaired Praxis  Impairment Details: Ideomotor;Motor planning Cognition Overall Cognitive Status: Impaired/Different from baseline Arousal/Alertness: Awake/alert Orientation Level: Person;Nonverbal/unable to assess(global aphasia impacting report) Awareness: Impaired Problem Solving: Impaired Safety/Judgment: Impaired Sensation Sensation Light Touch: (difficult to assess 2/2 aphasia) Coordination Gross Motor Movements are Fluid and Coordinated: Yes Fine Motor Movements are Fluid and Coordinated: No Motor  Motor Motor:  Hemiplegia;Abnormal postural alignment and control Motor - Skilled Clinical Observations: mild R Mobility  Transfers Sit to Stand: Contact Guard/Touching assist Stand to Sit: Contact Guard/Touching assist  Trunk/Postural Assessment  Cervical Assessment Cervical Assessment: (head forward) Thoracic Assessment Thoracic Assessment: (rounded shoulders) Lumbar Assessment Lumbar Assessment: (post pelvic preference) Postural Control Postural Control: Deficits on evaluation(delayed)  Balance Balance Balance Assessed: Yes Dynamic Sitting Balance Dynamic Sitting - Level of Assistance: 5: Stand by assistance Static Standing Balance Static Standing - Level of Assistance: 4: Min assist Extremity/Trunk Assessment RUE Assessment RUE Assessment: Exceptions to [facility] General Strength Comments: full ROM, generalized weakness and decreased coordination LUE Assessment LUE Assessment: Within Functional Limits     Refer to Care Plan for Long Term Goals  Recommendations for other services: None    Discharge Criteria: Patient will be discharged from OT if patient refuses treatment 3 consecutive times without medical reason, if treatment goals not met, if there is a change in medical status, if patient makes no progress towards goals or if patient is discharged from hospital.  The above assessment, treatment plan, treatment alternatives and goals were discussed and mutually agreed upon: by  patient  Tonny Branch 08/22/2018, 8:42 AM

## 2018-08-22 NOTE — Progress Notes (Signed)
Patient information reviewed and entered into eRehab System by Becky Keiden Deskin, PPS coordinator. Information including medical coding, function ability, and quality indicators will be reviewed and updated through discharge.   

## 2018-08-22 NOTE — Evaluation (Signed)
Speech Language Pathology Assessment and Plan  Patient Details  Name: Mark [name] MRN: [id number] Date of Birth: 1950-11-03  SLP Diagnosis: Cognitive Impairments;Dysphagia;Speech and Language deficits  Rehab Potential: Good ELOS: 10 to 12 days    Today's Date: 08/22/2018 SLP Individual Time:  -      Problem List:  Patient Active Problem List   Diagnosis Date Noted  . 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 CKD (chronic kidney disease), stage III ([place])   . Acute blood loss anemia   . Diabetes mellitus type 2 in nonobese (HCC)   . Essential hypertension   . Global aphasia   . Acute on chronic respiratory failure with hypoxia ([place])   . Chronic kidney disease, stage III (moderate) (HCC)   . Nontraumatic intracranial hemorrhage, unspecified ([place])   . Altered mental status, unspecified   . S/P AAA repair 02/01/2018   Past Medical History:  Past Medical History:  Diagnosis Date  . Acute on chronic respiratory failure with hypoxia ([place])   . Altered mental status, unspecified   . Chronic kidney disease    CKD stage 3  . Chronic kidney disease, stage III (moderate) (HCC)   . Concussion    multiple in high school  . Coronary artery disease    pt denies  . Diabetes mellitus without complication (HCC)    borderline  . Hypertension   . Nontraumatic intracranial hemorrhage, unspecified ([place])   . PVD (peripheral vascular disease) (HCC)    leg stents femoral artery  . Stroke [facility])    3 years ago- no residual   Past Surgical History:  Past Surgical History:  Procedure Laterality Date  . ABDOMINAL AORTIC ENDOVASCULAR STENT GRAFT N/A 02/01/2018   Procedure: ABDOMINAL AORTIC ENDOVASCULAR STENT GRAFT;  Surgeon: Serafina Mitchell, MD;  Location: [place];  Service: Vascular;  Laterality: N/A;  . NECK SURGERY    . VEIN BYPASS SURGERY      Assessment / Plan / Recommendation Clinical Impression Mark [name] is a 68 year old male with history of HTN, AAA,  ASCVD, CVA with diagnosis of left temporal AVM  who was admitted to [facility] on 07/05/2018 with right sided weakness due to left temporal hemorrhage related to AVM rupture. He underwent left craniectomy for evacuation of hematoma after partial embolization of AVM with multiple surgeries to follow for of the AVM.  He was intubated on May 8th, trached on May 20, received PEG on May 23rd and was transfered to [facility] on June 5th for vent wean. Pt currently has a cuffless Shiley size #4 that is capped. He remains NPO with ice chips recommended without instrumental study. Tube feeds ongoing for nutritional support.  He was showing improvement in activation, therpay tolerance with intermittent ability to follow some commands. CIR recommended by therapy team due to overall functional decline.    Pt admitted to CIR on 08/21/18.  Despite lengthy intubation, pt's vocal quality appears within normal limits. Pt presents with severe Wernicke's aphasia that impacts all modalities of language. Pt's speech is fluent with typical prosody and intonation but contains neologisms that form word salad with further impairments in repeating sounds or words, understanding spoken or written language and inability to write/trace letters. Pt is largely unaware of errors. 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 Pt's overall oral motor function appears functional with instrumental swallow study required. ST d/c'ed  recommendation for ice chips until instrumental study performed.   Skilled Therapeutic Interventions          Skilled treatment session focused on ocmpleting the above mentioned assessment, see above. Despite Total A multimodal pt was not able to follow simple 1 step commands, attempted to walk to bathroom and required Mod A d/t cognitive-lingutic deficits  for clothing management. Pt was not able to repeat any sounds or words and was not able to consistently name/recognize people in family pictures.  Education also provided to pt's NT on language deficits and ways to facilitate comprehension. Pt left in bed, bed alarm on and all needs within reach d/t impulsivity and decreased insight.    SLP Assessment  Patient will need skilled [place] Pathology Services during CIR admission    Recommendations  SLP Diet Recommendations: NPO Medication Administration: Via alternative means Patient destination: Home Follow up Recommendations: Outpatient SLP;24 hour supervision/assistance Equipment Recommended: To be determined    SLP Frequency 3 to 5 out of 7 days   SLP Duration  SLP Intensity  SLP Treatment/Interventions 10 to 12 days  Minumum of 1-2 x/day, 30 to 90 minutes  Functional tasks;Patient/family education;Dysphagia/aspiration precaution training;Speech/Language facilitation    Pain    Prior Functioning Cognitive/Linguistic Baseline: Information not available Type of Home: House  Lives With: Alone Available Help at Discharge: Family;Available 24 hours/day;Other (Comment)  Short Term Goals: Week 1: SLP Short Term Goal 1 (Week 1): Pt will demonstrate sustained attention to task for 10 minutes with Min A cues. SLP Short Term Goal 2 (Week 1): With SLP positioned on pt's right, pt will scan to task in 4 out of 10 opportunities with Max A cues. SLP Short Term Goal 3 (Week 1): Dysphagia goals to added after completion of instrumental study on 08/23/18 SLP Short Term Goal 4 (Week 1): Speech-language goals to be added after additional session.  Refer to Care Plan for Long Term Goals  Recommendations for other services: None   Discharge Criteria: Patient will be discharged from SLP if patient refuses treatment 3 consecutive times without medical reason, if treatment goals not met, if there is a change in medical status, if patient  makes no progress towards goals or if patient is discharged from hospital.  The above assessment, treatment plan, treatment alternatives and goals were discussed and mutually agreed upon: No family available/patient unable  Carlinda Ohlson 08/22/2018, 2:10 PM

## 2018-08-22 NOTE — Progress Notes (Signed)
[place] PHYSICAL MEDICINE & REHABILITATION PROGRESS NOTE   Subjective/Complaints: Up in bed. No complaints.   ROS: limited due to language/communication    Objective:   No results found. Recent Labs    08/22/18 0501  WBC 8.1  HGB 10.6*  HCT 33.3*  PLT 337   Recent Labs    08/22/18 0501  NA 139  K 3.8  CL 104  CO2 27  GLUCOSE 126*  BUN 24*  CREATININE 1.23  CALCIUM 9.2   No intake or output data in the 24 hours ending 08/22/18 1015   Physical Exam: Vital Signs Blood pressure (!) 156/67, pulse 89, temperature 98 F (36.7 C), temperature source Oral, resp. rate 16, height 5\' 6"  (1.676 m), weight 54.8 kg, SpO2 98 %. Constitutional: No distress . Vital signs reviewed. HEENT: EOMI, oral membranes moist Neck: supple, trach plugged Cardiovascular: RRR without murmur. No JVD    Respiratory: CTA Bilaterally without wheezes or rales. Normal effort    GI: BS +, non-tender, non-distended, PEG in place  Musculoskeletal:     Comments: full PROM Neurological: He is alert.  Significant receptive aphasia. Perhaps one or two appropriate responses in our conversation. Right inattention? Moves all 4's at least 4/5. Seems to sense pain.   Skin: Skin is warm and dry. Wound CDI staples out Psychiatric:  Pleasant, not anxious     Assessment/Plan: 1. 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  Physiatrist is providing close team supervision and 24 hour management of active medical problems listed below.  Physiatrist and rehab team continue to assess barriers to discharge/monitor patient progress toward functional and medical goals  Care Tool:  Bathing              Bathing assist       Upper Body Dressing/Undressing Upper body dressing   What is the patient wearing?: Hospital gown only    Upper body assist Assist Level: Moderate Assistance - Patient 50 -  74%    Lower Body Dressing/Undressing Lower body dressing      What is the patient wearing?: Incontinence brief     Lower body assist Assist for lower body dressing: Total Assistance - Patient < 25%     Toileting Toileting    Toileting assist Assist for toileting: Total Assistance - Patient < 25%     Transfers Chair/bed transfer  Transfers assist  Chair/bed transfer activity did not occur: N/A  Chair/bed transfer assist level: Minimal Assistance - Patient > 75%     Locomotion Ambulation   Ambulation assist              Walk 10 feet activity   Assist           Walk 50 feet activity   Assist           Walk 150 feet activity   Assist           Walk 10 feet on uneven surface  activity   Assist           Wheelchair     Assist               Wheelchair 50 feet with 2 turns activity    Assist            Wheelchair 150 feet activity     Assist          Medical Problem List and  Plan: 1.  Deficits with mobility, self-care, language, nutrition, cognition, swallowing secondary to left temporal ICH            Patient is beginning CIR therapies today including PT and OT and SLP 2.  Antithrombotics: -DVT/anticoagulation:  Mechanical: Sequential compression devices, below knee Bilateral lower extremities              Lower extremity Dopplers pending             -antiplatelet therapy: N/A 3. Pain Management: Oxycodone prn.  4. Mood: LCSW to follow for evaluation and support when appropriate. Continue Zoloft and Klonopin for mood stabilization.              -antipsychotic agents:  n/A 5. Neuropsych: This patient is not capable of making decisions on his own behalf.    6. Skin/Wound Care: Routine pressure relief measures. Daily PEG and trach care.  7. Fluids/Electrolytes/Nutrition:  Remains NPO  -glucerna 1.2 TF 70cc/hr  -I personally reviewed the patient's labs today.  BUN trending down 24  -check prealbumin  tomorrow 8.  HTN: Monitor BP tid- continue amlodipine, hydralazine and lisinopril.    -borderline at present. Follow for pattern 9. T2DM: Diet controlled-Hgb A1c- 5.5 at admission. Will monitor BS ac/hs. Continue Glucerna tube feeds with Lantus daily and SSI qid prn elevated BS.    -controlled 6/25 10. ABLA: Monitor for signs of bleeding. hgb 10.6 11. CKD stage III: Cr 1.23---stable   LOS: 1 days A FACE TO FACE EVALUATION WAS PERFORMED  Meredith Staggers 08/22/2018, 10:15 AM

## 2018-08-22 NOTE — Evaluation (Signed)
Physical Therapy Assessment and Plan  Patient Details  Name: Mark [name] MRN: [id number] Date of Birth: Aug 06, 1950  PT Diagnosis: Abnormal posture, Abnormality of gait, Cognitive deficits, Contracture of joint: R ankle, Dizziness and giddiness and Hemiparesis dominant Rehab Potential: Good ELOS: 12-14   Today's Date: 08/22/2018 PT Individual Time: 1400-1505 PT Individual Time Calculation (min): 65 min    Problem List:  Patient Active Problem List   Diagnosis Date Noted  . 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 CKD (chronic kidney disease), stage III ([place])   . Acute blood loss anemia   . Diabetes mellitus type 2 in nonobese (HCC)   . Essential hypertension   . Global aphasia   . Acute on chronic respiratory failure with hypoxia ([place])   . Chronic kidney disease, stage III (moderate) (HCC)   . Nontraumatic intracranial hemorrhage, unspecified ([place])   . Altered mental status, unspecified   . S/P AAA repair 02/01/2018    Past Medical History:  Past Medical History:  Diagnosis Date  . Acute on chronic respiratory failure with hypoxia ([place])   . Altered mental status, unspecified   . Chronic kidney disease    CKD stage 3  . Chronic kidney disease, stage III (moderate) (HCC)   . Concussion    multiple in high school  . Coronary artery disease    pt denies  . Diabetes mellitus without complication (HCC)    borderline  . Hypertension   . Nontraumatic intracranial hemorrhage, unspecified ([place])   . PVD (peripheral vascular disease) (HCC)    leg stents femoral artery  . Stroke [facility])    3 years ago- no residual   Past Surgical History:  Past Surgical History:  Procedure Laterality Date  . ABDOMINAL AORTIC ENDOVASCULAR STENT GRAFT N/A 02/01/2018   Procedure: ABDOMINAL AORTIC ENDOVASCULAR STENT GRAFT;  Surgeon: Serafina Mitchell, MD;  Location: [place];  Service: Vascular;  Laterality: N/A;  . NECK SURGERY    . VEIN BYPASS SURGERY      Assessment &  Plan Clinical Impression: Mark [name] is a 68 year old male with history of HTN, AAA, ASCVD, CVA with diagnosis of left temporal AVM  who was admitted to [facility] on 07/05/2018 with right sided weakness due to left temporal hemorrhage related to AVM rupture.  History taken from chart review.  He underwent left craniectomy for evacuation of hematoma after partial embolization of AVM.  He was taken back to OR on 07/18/2018 for resection of residual AVM with cranioplasty and PEG placement.  Had tracheostomy placed on 07/20/2018. He had difficulty with vent wean due to issues with agitation as well as copious secretions. He had issues with somnolence, right hemiparesis as well as aphasia. He was transferred to [facility] on 08/01/18 for medical management and vent wean. He has required Klonopin and precedex for management of agitation.  He tolerated vent wean and trach downsized to CFS # 4 with plugging on 08/20/2018. He was maintained on Keppra for seizure prophylaxis and provigil and amantadine added for activation.  He has been incontinent of bowel and bladder.  He remains NPO with ice chips and tube feeds ongoing for nutritional support.  Patient transferred to CIR on 08/21/2018 .   Patient currently requires mod assist with mobility secondary to muscle weakness and muscle joint tightness, decreased cardiorespiratoy endurance, impaired timing and sequencing, unbalanced muscle activation, decreased coordination and decreased motor planning, decreased visual perceptual skills and field cut, decreased attention to right and decreased  motor planning, decreased attention, decreased awareness, decreased problem solving, decreased safety awareness and decreased memory and decreased sitting balance, decreased standing balance, hemiplegia and decreased balance strategies.  Prior to hospitalization, patient was independent  with mobility and lived  Alone in a House home.  Home access is 2Stairs to enter, no  rails, per chart.  Patient will benefit from skilled PT intervention to maximize safe functional mobility, minimize fall risk and decrease caregiver burden for planned discharge home with 24 hour supervision.  Anticipate patient will benefit from follow up [place] at discharge.  PT - End of Session Activity Tolerance: Tolerates < 10 min activity with changes in vital signs Endurance Deficit: Yes Endurance Deficit Description: pt dizzy upon sitting up, standing, stairs PT Assessment Rehab Potential (ACUTE/IP ONLY): Good PT Barriers to Discharge: Decreased caregiver support PT Patient demonstrates impairments in the following area(s): Balance;Perception;Endurance;Safety;Motor PT Transfers Functional Problem(s): Bed Mobility;Bed to Chair;Car;Furniture PT Locomotion Functional Problem(s): Ambulation;Other (comment);Wheelchair Mobility;Stairs PT Plan PT Intensity: Minimum of 1-2 x/day ,45 to 90 minutes PT Frequency: 5 out of 7 days PT Duration Estimated Length of Stay: 12-14 PT Treatment/Interventions: Ambulation/gait training;Cognitive remediation/compensation;Discharge planning;DME/adaptive equipment instruction;Functional mobility training;Pain management;Psychosocial support;Splinting/orthotics;Therapeutic Activities;UE/LE Strength taining/ROM;Visual/perceptual remediation/compensation;Balance/vestibular training;Community reintegration;Functional electrical stimulation;Neuromuscular re-education;Patient/family education;Stair training;Therapeutic Exercise;UE/LE Coordination activities;Wheelchair propulsion/positioning PT Transfers Anticipated Outcome(s): supervision basic and car PT Locomotion Anticipated Outcome(s): supervision gait x 150' wiht LRAD, up/down 12 steps 2 rails, CGA up/down 2 steps no rails for home entry PT Recommendation Follow Up Recommendations: Home health PT Patient destination: Home Equipment Recommended: To be determined  Skilled Therapeutic Intervention  Pt participated  well in eval, comprehending gestures and simple phrases fairly well.   After gait x 8', pt communicated that he had thought he was "ready to go, but I need time."   Pt communicated that he was dizzy upon sitting up, standing and when negotiating 2 low steps.  Seated therapeutic activity: reaching L and R out of BOS to retrieve clothes pins with R and L hands.  Trunk shortening/lengthening evident bil.  Pt spontaneously used bil hands to place pillow cases on pillows with set up.  Some RUE motor control /motor planning deficits evident.    neuromuscular re-education via forced use, multimodal cues for alternating reciprocal movement bil LEs, using KInetron from w/c, level 40 cm/sec, x 2 minutes x 2.  At end of session, pt left sitting in w/c at nurse's station for observation; seat belt alarm set and his RN, Erline Levine aware.   PT Evaluation Precautions/Restrictions- falls; global aphasia   General   Vital SignsTherapy Vitals Temp: 98 F (36.7 C) Temp Source: Oral Pulse Rate: 78 Resp: 17 BP: 140/70 Patient Position (if appropriate): Lying Oxygen Therapy SpO2: 98 % O2 Device: Room Air Pain Pain Assessment Pain Score: 0-No pain(no S/S of pain) Home Living/Prior Functioning Home Living Available Help at Discharge: Family;Available 24 hours/day;Other (Comment)(with plans to hire assist as needed) Type of Home: House Home Access: Stairs to enter [employer] of Steps: 2 Entrance Stairs-Rails: None Home Layout: Two level;Able to live on main level with bedroom/bathroom Alternate Level Stairs-Number of Steps: NA Alternate Level Stairs-Rails: None(NA) Bathroom Shower/Tub: Chiropodist: Standard Bathroom Accessibility: Yes Additional Comments: family open to [facility] services and hiring assistance as needed  Lives With: Alone  Pt was driving and independent PTA.  Vision/Perception  Pt appears to have R field cut.  He demonstrates R inattention but can be visually  cued with success.    Cognition Overall Cognitive Status: Impaired/Different  from baseline Arousal/Alertness: Awake/alert Attention: Sustained Awareness: Impaired Problem Solving: Impaired Safety/Judgment: Impaired Sensation Sensation Light Touch: Appears Intact Proprioception: Appears Intact Coordination Gross Motor Movements are Fluid and Coordinated: No Heel Shin Test: slightly decreased accuracy RLE Motor  Motor Motor: Hemiplegia  Mobility Bed Mobility Bed Mobility: Rolling Right;Rolling Left;Right Sidelying to Sit;Sit to Supine Rolling Right: Supervision/verbal cueing Rolling Left: Supervision/Verbal cueing Right Sidelying to Sit: Supervision/Verbal cueing Sit to Supine: Supervision/Verbal cueing Transfers Transfers: Stand Pivot Transfers Sit to Stand: Contact Guard/Touching assist;Minimal Assistance - Patient > 75% Stand to Sit: Minimal Assistance - Patient > 75% Stand Pivot Transfers: Minimal Assistance - Patient > 75% Stand Pivot Transfer Details: Tactile cues for placement;Tactile cues for initiation;Manual facilitation for weight shifting Transfer (Assistive device): None Locomotion  Gait Ambulation: Yes Gait Assistance: Moderate Assistance - Patient 50-74% Assistive device: None Gait Gait: Yes Gait Pattern: Impaired Gait Pattern: Shuffle;Narrow base of support;Trunk flexed;Decreased hip/knee flexion - right Disance: 8'.  As pt approached w/c he was going to sit in, posture became more flexed.  Pt turned L to sit in chair rather than R which was more convenient. Stairs / Additional Locomotion Stairs: Yes Stairs Assistance: Minimal Assistance - Patient > 75% Stair Management Technique: Two rails Number of Stairs: 2 Height of Stairs: 3 Wheelchair Mobility Wheelchair Mobility: Yes Wheelchair Assistance: Total Assistance - Patient <25% Wheelchair Propulsion: Both lower extermities Wheelchair Parts Management: Needs assistance Distance: 5  Trunk/Postural  Assessment  Cervical Assessment Cervical Assessment: Exceptions to WFL(forward head) Thoracic Assessment Thoracic Assessment: Exceptions to WFL(rounded shoulders) Lumbar Assessment Lumbar Assessment: Exceptions to WFL(sits in posterior pelvic tilt) Postural Control Postural Control: Deficits on evaluation(delayed righting reactions)  Balance Balance Balance Assessed: Yes Dynamic Sitting Balance Dynamic Sitting - Balance Support: Feet supported;During functional activity Dynamic Sitting - Level of Assistance: 5: Stand by assistance Dynamic Sitting Balance - Compensations: reaching out of BOS with either hand to retrieve clothes pins Dynamic Sitting - Balance Activities: Forward lean/weight shifting;Lateral lean/weight shifting Static Standing Balance Static Standing - Level of Assistance: 4: Min assist  Dynamic standing- during car transfer and gait, pt gradually leaned forward more, unsafely, requiring increased assistance (mod).  Extremity Assessment      RLE Assessment RLE Assessment: Exceptions to [facility] Passive Range of Motion (PROM) Comments: -10 degrees ankle DF with extended knee General Strength Comments: in lying, grossly 4/5 hip flexion, knee ext, ankle DF LLE Assessment LLE Assessment: Within Functional Limits    Refer to Care Plan for Long Term Goals  Recommendations for other services: None   Discharge Criteria: Patient will be discharged from PT if patient refuses treatment 3 consecutive times without medical reason, if treatment goals not met, if there is a change in medical status, if patient makes no progress towards goals or if patient is discharged from hospital.  The above assessment, treatment plan, treatment alternatives and goals were discussed and mutually agreed upon: by patient  Caillou Minus 08/22/2018, 4:49 PM

## 2018-08-22 NOTE — Progress Notes (Signed)
Initial Nutrition Assessment  DOCUMENTATION CODES:   Severe malnutrition in context of chronic illness  INTERVENTION:   Tube feeding via PEG: - Glucerna 1.2 @ 70 ml/hr to run over 20 hours (tube feeding can be held for up to 4 hours for therapies) for a total of 1400 ml of formula daily - Free water per PA/MD, currently 100 ml 5 X daily  Tube feeding regimen and current free water provides 1680 kcal, 84 grams of protein, and 1627 ml of H2O (100% of needs).  - d/c Pro-stat  NUTRITION DIAGNOSIS:   Severe Malnutrition related to chronic illness (dysphagia, respiratory failure) as evidenced by moderate fat depletion, severe fat depletion, moderate muscle depletion, severe muscle depletion.  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 Pt underwent left craniectomy for evacuation of hematoma after partial embolization of AVM. Pt was taken back to OR on 07/18/18 for resection of residual AVM with cranioplasty and PEG placement. Had tracheostomy placed on 07/20/18. Pt had difficulty with vent wean due to issues with agitation as well as copious secretions. Pt had issues with somnolence, right hemiparesis as well as aphasia. Pt was transferred to [facility] on 08/01/18 for medical management and vent wean. Pt tolerated vent wean and trach downsized to CFS #4 with plugging on 08/20/18. Pt remains NPO with ice chips and tube feeds ongoing for nutritional support. Pt admitted to CIR on 08/21/18.  Discussed pt with RN.  Spoke with pt briefly at bedside. Pt  states he is doing "the same" and denies any abdominal pain, N/V.  Reviewed weight history in chart. Noted pt with a 6.5 kg weight loss over the last 8 months. This is a 10.6% weight loss which is not quite significant for timeframe.  Based on findings of NFPE, pt meets criteria for severe malnutrition.  Current TF: Glucerna 1.2 @ 65 ml/hr, Pro-stat 30 ml BID, free water 100 ml 5 X daily  Medications reviewed and include: Pepcid, SSI q 6 hours, Lantus 5 units daily, Bacid 2 tablets TID, liquid MVI, Miralax, thiamine  Labs reviewed. CBG's: 101-124  NUTRITION - FOCUSED PHYSICAL EXAM:    Most Recent Value  Orbital Region  Severe depletion  Upper Arm Region  Moderate depletion  Thoracic and Lumbar Region  Severe depletion  Buccal Region  Moderate depletion  Temple Region  Severe depletion  Clavicle Bone Region  Severe depletion  Clavicle and Acromion Bone Region  Severe depletion  Scapular Bone Region  Unable to assess  Dorsal Hand  Moderate depletion  Patellar Region  Moderate depletion  Anterior Thigh Region  Severe depletion  Posterior Calf Region  Severe depletion  Edema (RD Assessment)  None  Hair  Reviewed  Eyes  Reviewed  Mouth  Reviewed  Skin  Reviewed  Nails  Reviewed       Diet Order:   Diet Order            Diet NPO time specified Except for: Ice Chips  Diet effective now  EDUCATION NEEDS:   No education needs have been identified at this time  Skin:  Skin Assessment: Reviewed RN Assessment  Last BM:  08/21/18  Height:   Ht Readings from Last 1 Encounters:  08/21/18 5\' 6"  (1.676 m)    Weight:   Wt Readings from Last 1 Encounters:  08/21/18 54.8 kg    Ideal Body Weight:  64.5 kg  BMI:  Body mass index is 19.51 kg/m.  Estimated Nutritional Needs:   Kcal:  1550-1750  Protein:  75-90 grams  Fluid:  >/= 1.5 L    Gaynell Face, MS, RD, LDN Inpatient Clinical Dietitian Pager: [phone] Weekend/After Hours:  [phone]

## 2018-08-22 NOTE — Progress Notes (Signed)
Bilateral lower extremity venous duplex has been completed. Preliminary results can be found in CV Proc through chart review.   08/22/18 11:46 AM Mark [name] RVT

## 2018-08-23 ENCOUNTER — Encounter (HOSPITAL_COMMUNITY): Payer: Medicare HMO | Admitting: Speech Pathology

## 2018-08-23 ENCOUNTER — Inpatient Hospital Stay (HOSPITAL_COMMUNITY): Payer: Medicare HMO | Admitting: Physical Therapy

## 2018-08-23 ENCOUNTER — Inpatient Hospital Stay (HOSPITAL_COMMUNITY): Payer: Medicare HMO

## 2018-08-23 LAB — GLUCOSE, CAPILLARY
Glucose-Capillary: 135 mg/dL — ABNORMAL HIGH (ref 70–99)
Glucose-Capillary: 117 mg/dL — ABNORMAL HIGH (ref 70–99)
Glucose-Capillary: 105 mg/dL — ABNORMAL HIGH (ref 70–99)
Glucose-Capillary: 113 mg/dL — ABNORMAL HIGH (ref 70–99)

## 2018-08-23 MED ORDER — FREE WATER
100.0000 mL | Freq: Two times a day (BID) | Status: DC
  Administered 2018-08-23 – 2018-08-27 (×9): 100 mL

## 2018-08-23 MED ORDER — GLUCERNA 1.2 CAL PO LIQD
1000.0000 mL | ORAL | Status: AC
  Administered 2018-08-23: 1000 mL
  Filled 2018-08-23: qty 1000

## 2018-08-23 MED ORDER — GLUCERNA 1.2 CAL PO LIQD
1000.0000 mL | ORAL | Status: DC
  Filled 2018-08-23: qty 1000

## 2018-08-23 MED ORDER — GLUCERNA 1.5 CAL PO LIQD
672.0000 mL | ORAL | Status: DC
  Administered 2018-08-24 – 2018-08-26 (×3): 672 mL
  Filled 2018-08-23 (×2): qty 711
  Filled 2018-08-23 (×3): qty 1000

## 2018-08-23 NOTE — Plan of Care (Signed)
  Problem: RH BLADDER ELIMINATION Goal: RH STG MANAGE BLADDER WITH ASSISTANCE Description: STG Manage Bladder With mod Assistance Outcome: Progressing   Problem: RH PAIN MANAGEMENT Goal: RH STG PAIN MANAGED AT OR BELOW PT'S PAIN GOAL Description: <3 on a 0-10 pain scale Outcome: Progressing   Problem: RH KNOWLEDGE DEFICIT BRAIN INJURY Goal: RH STG INCREASE KNOWLEDGE OF SELF CARE AFTER BRAIN INJURY Description: Patient will demonstrate how to manage medications, dietary restrictions, and follow up care with the MD post discharge with min assist from rehab staff. Outcome: Not Progressing

## 2018-08-23 NOTE — Progress Notes (Signed)
Occupational Therapy Session Note  Patient Details  Name: Mark [name] MRN: [id number] Date of Birth: 03/22/1950  Today's Date: 08/23/2018 OT Individual Time: 1300-1415 OT Individual Time Calculation (min): 75 min    Short Term Goals: Week 1:  OT Short Term Goal 1 (Week 1): Pt will fasten B shoes wiht Supervision OT Short Term Goal 2 (Week 1): Pt will locate 3/3 items on R wiht min VC OT Short Term Goal 3 (Week 1): Pt will complete toilet transfer wiht Supervision and LRAD  Skilled Therapeutic Interventions/Progress Updates:    1;1. Pt received from direct handoff from NT with food tray. Pt self feeds with MOD HOH A for proper scooping and red foam handle applied to utensil. Pt demo perseverative small movements to attempt to scoop food requiring physical A to adapt technique to load food onto utensil. Pt with 1 coughing eopisde during consumption of ice cream and requires intervention to wait between bites. Pt completes toileting with steady A and A for thorough hygiene after BM. Pt compltes standing oral hygiene with Mod VC to locate toothbrush and toothpaste to R of faucet at midline for R attention. Pt able to brush teeth with RUE terminating appropriately. Pt stands at countertop at kitchen to unload dishwasher for R attention and HOH A reaching with RUE or facilitation of WB into countertop with RUE. Pt requires MAX VC for sorting dishes into correct cabinet placement/attending to R. Pt has 2 lateral LOB to R with manual facilitation of stepping RLE laterally to R to widen BOS while reaching as pt does not move feet automatically demonstrating poor postural control and righting reactions. Pt completes 3x30 ball toss seated in w/c for BUE coordination and motor planning. Pt unable to follow alternating pattern of peg board despite multimodal cues. Pt requires HOH A to rotate peg in RUE ot place into hole. Pt consistently underreaching this date to targets. Exited session with pt seated in  w/c, belt alarm on and all needs met  Therapy Documentation Precautions:  Precautions Precaution Comments: recent craioplasty; PEG; trach  Restrictions Weight Bearing Restrictions: No General:   Vital Signs: Therapy Vitals Patient Position (if appropriate): Sitting Oxygen Therapy O2 Device: Room Air Pain:   ADL:   Vision   Perception    Praxis   Exercises:   Other Treatments:     Therapy/Group: Individual Therapy   M  08/23/2018, 1:51 PM 

## 2018-08-23 NOTE — Progress Notes (Signed)
Patient observed sitting on buttocks at bedside. Upon assessment Patient cognition is at baseline. Vital Signs WNL's. Skin tear noted to (R) Hand. Patient assisted from floor x 2 staff assist. Dr. Naaman Plummer informed of incident via telephone. Verbal Orders received for Neuro Checks Q 2* for the next 4*. Verbal Orders RBAV. Patient placed in wheelchair and brought to nurses station for safety and observation. Franne Forts (Daughter) informed of incident via telephone.

## 2018-08-23 NOTE — Progress Notes (Signed)
[facility] PHYSICAL MEDICINE & REHABILITATION PROGRESS NOTE   Subjective/Complaints: Pt in bed. No new complaints. Upgraded to diet by SLP today!  ROS: limited due to language/communication    Objective:   Vas [place] Lower Extremity Venous (dvt)  Result Date: 08/22/2018  Lower Venous Study Indications: Edema.  Risk Factors: None identified. Limitations: Patient positioning. Comparison Study: No prior studies. Performing Technologist: Oliver Hum RVT  Examination Guidelines: A complete evaluation includes B-mode imaging, spectral Doppler, color Doppler, and power Doppler as needed of all accessible portions of each vessel. Bilateral testing is considered an integral part of a complete examination. Limited examinations for reoccurring indications may be performed as noted.  +---------+---------------+---------+-----------+----------+-------+ RIGHT    CompressibilityPhasicitySpontaneityPropertiesSummary +---------+---------------+---------+-----------+----------+-------+ CFV      Full           Yes      Yes                          +---------+---------------+---------+-----------+----------+-------+ SFJ      Full                                                 +---------+---------------+---------+-----------+----------+-------+ FV Prox  Full                                                 +---------+---------------+---------+-----------+----------+-------+ FV Mid   Full                                                 +---------+---------------+---------+-----------+----------+-------+ FV DistalFull                                                 +---------+---------------+---------+-----------+----------+-------+ PFV      Full                                                 +---------+---------------+---------+-----------+----------+-------+ POP      Full           Yes      Yes                           +---------+---------------+---------+-----------+----------+-------+ PTV      Full                                                 +---------+---------------+---------+-----------+----------+-------+ PERO     Full                                                 +---------+---------------+---------+-----------+----------+-------+   +---------+---------------+---------+-----------+----------+-------+  LEFT     CompressibilityPhasicitySpontaneityPropertiesSummary +---------+---------------+---------+-----------+----------+-------+ CFV      Full           Yes      Yes                          +---------+---------------+---------+-----------+----------+-------+ SFJ      Full                                                 +---------+---------------+---------+-----------+----------+-------+ FV Prox  Full                                                 +---------+---------------+---------+-----------+----------+-------+ FV Mid   Full                                                 +---------+---------------+---------+-----------+----------+-------+ FV DistalFull                                                 +---------+---------------+---------+-----------+----------+-------+ PFV      Full                                                 +---------+---------------+---------+-----------+----------+-------+ POP      Full           Yes      Yes                          +---------+---------------+---------+-----------+----------+-------+ PTV      Full                                                 +---------+---------------+---------+-----------+----------+-------+ PERO     Full                                                 +---------+---------------+---------+-----------+----------+-------+     Summary: Right: There is no evidence of deep vein thrombosis in the lower extremity. No cystic structure found in the popliteal fossa. Left: There is  no evidence of deep vein thrombosis in the lower extremity. No cystic structure found in the popliteal fossa.  *See table(s) above for measurements and observations. Electronically signed by Lemar Livings MD on 08/22/2018 at 1:12:23 PM.    Final    Recent Labs    08/22/18 0501  WBC 8.1  HGB 10.6*  HCT 33.3*  PLT 337   Recent Labs    08/22/18 0501  NA 139  K 3.8  CL 104  CO2 27  GLUCOSE 126*  BUN 24*  CREATININE 1.23  CALCIUM 9.2    Intake/Output Summary (Last 24 hours) at 08/23/2018 1044 Last data filed at 08/23/2018 0946 Gross per 24 hour  Intake 717.67 ml  Output 250 ml  Net 467.67 ml     Physical Exam: Vital Signs Blood pressure (!) 154/63, pulse 62, temperature 98.2 F (36.8 C), resp. rate 18, height 5\' 6"  (1.676 m), weight 54.8 kg, SpO2 96 %. Constitutional: No distress . Vital signs reviewed. HEENT: EOMI, oral membranes moist Neck: supple, trach capped.  Cardiovascular: RRR without murmur. No JVD    Respiratory: CTA Bilaterally without wheezes or rales. Normal effort, trach GI: BS +, non-tender, non-distended  Musculoskeletal:     Comments: full PROM Neurological: He is alert.  Significant receptive aphasia. Answered his first name when I asked his name. Otherwise word salad. Voice strong. Moves all 4's at least 4/5. Seems to sense pain.   Skin: Skin is warm and dry. Wound CDI staples out Psychiatric:  Pleasant, calm     Assessment/Plan: 1. 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  Physiatrist is providing close team supervision and 24 hour management of active medical problems listed below.  Physiatrist and rehab team continue to assess barriers to discharge/monitor patient progress toward functional and medical goals  Care Tool:  Bathing    Body parts bathed by patient: Right arm, Left arm, Chest, Abdomen, Front perineal area, Buttocks,  Right upper leg, Left upper leg, Right lower leg, Left lower leg, Face         Bathing assist Assist Level: Minimal Assistance - Patient > 75%     Upper Body Dressing/Undressing Upper body dressing   What is the patient wearing?: Pull over shirt, Hospital gown only    Upper body assist Assist Level: Minimal Assistance - Patient > 75%    Lower Body Dressing/Undressing Lower body dressing      What is the patient wearing?: Incontinence brief, Pants     Lower body assist Assist for lower body dressing: Minimal Assistance - Patient > 75%     Toileting Toileting    Toileting assist Assist for toileting: Total Assistance - Patient < 25%     Transfers Chair/bed transfer  Transfers assist  Chair/bed transfer activity did not occur: N/A  Chair/bed transfer assist level: Minimal Assistance - Patient > 75%     Locomotion Ambulation   Ambulation assist      Assist level: Moderate Assistance - Patient 50 - 74% Assistive device: No Device Max distance: 8   Walk 10 feet activity   Assist  Walk 10 feet activity did not occur: Safety/medical concerns(dynanuc balance deficits)        Walk 50 feet activity   Assist Walk 50 feet with 2 turns activity did not occur: Safety/medical concerns(dynamic balance deficits)         Walk 150 feet activity   Assist Walk 150 feet activity did not occur: Safety/medical concerns(dynamic balance deficits)         Walk 10 feet on uneven surface  activity   Assist Walk 10 feet on uneven surfaces activity did not occur: Safety/medical concerns(dynamic balance deficits)         Wheelchair     Assist Will patient use wheelchair at discharge?: No      Wheelchair assist level: Total Assistance - Patient < 25% Max wheelchair distance: 5  Wheelchair 50 feet with 2 turns activity    Assist    Wheelchair 50 feet with 2 turns activity did not occur: Safety/medical concerns(unable to motor plan)        Wheelchair 150 feet activity     Assist Wheelchair 150 feet activity did not occur: Safety/medical concerns(unable to motor plan)        Medical Problem List and Plan: 1.  Deficits with mobility, self-care, language, nutrition, cognition, swallowing secondary to left temporal ICH            Patient is beginning CIR therapies today including PT and OT and SLP 2.  Antithrombotics: -DVT/anticoagulation:  Mechanical: Sequential compression devices, below knee Bilateral lower extremities              Lower extremity Dopplers negative             -antiplatelet therapy: N/A 3. Pain Management: Oxycodone prn.  4. Mood: LCSW to follow for evaluation and support when appropriate. Continue Zoloft and Klonopin for mood stabilization.              -antipsychotic agents:  n/A 5. Neuropsych: This patient is not capable of making decisions on his own behalf.    6. Skin/Wound Care: Routine pressure relief measures. Daily PEG and trach care.  7. Fluids/Electrolytes/Nutrition:  Remains NPO  -glucerna 1.2 TF 70cc/hr   BUN trending down 24  -advanced to D2/thin diet today!, will change TF to nocturnal only  -check prealbumin on Monday along with another bmet 8.  HTN: Monitor BP tid- continue amlodipine, hydralazine and lisinopril.    -borderline at present. Follow for pattern 9. T2DM: Diet controlled-Hgb A1c- 5.5 at admission. Will monitor BS ac/hs. Continue Glucerna tube feeds with Lantus daily and SSI qid prn elevated BS.    -controlled 6/26  10. ABLA: Monitor for signs of bleeding. hgb 10.6 11. CKD stage III: Cr 1.23---stable  Recheck monday 12. Pulmonary: keep trach plugged over weekend.   -if no problems with diet over w/e will decannulate Monday   LOS: 2 days A FACE TO FACE EVALUATION WAS PERFORMED  Ranelle OysterZachary T Eniola Cerullo 08/23/2018, 10:44 AM

## 2018-08-23 NOTE — Progress Notes (Addendum)
Patient restless for the first half of the shift. Impulsive, attempting to get out of bed unassisted frequently. Telesitter monitoring initiated. TF in progress, pt tolerating without difficulty. O2 sats remain above 90%. Trach capped. CBG-125 this am. Pt pleasant and cooperative with staff this am. Patient slept for 5 hours.

## 2018-08-23 NOTE — Progress Notes (Signed)
Physical Therapy Session Note  Patient Details  Name: Mark [name] MRN: [id number] Date of Birth: 04-Jun-1950  Today's Date: 08/23/2018 PT Individual Time: 1050-1159 PT Individual Time Calculation (min): 69 min   Short Term Goals: Week 1:  PT Short Term Goal 1 (Week 1): pt will transfer consistently bed>< w/c with supervision PT Short Term Goal 2 (Week 1): pt will perform gait with LRAD x 50' including turns PT Short Term Goal 3 (Week 1): pt will negotiate 12 steps 2 rails with close supervision PT Short Term Goal 4 (Week 1): pt will negotiate 1 curb/step with min assist and LRAD  Skilled Therapeutic Interventions/Progress Updates:  Pt received in bed with nurse present administering meds. Pt dons shorts bed level with supervision, shoes & sweatshirt sitting EOB with supervision but ultimately requires assistance to tie R shoe lates. Pt transfers to sitting EOB with supervision and hospital bed features. Pt ambulates bed>w/c with mod assist and pt attempting to furniture walk. Transported pt to gym via w/c dependent assist for time management. Pt transfers sit<>stand with min assist and ambulates 125 ft x 2 + 200 ft with RUE HHA & mod fade to min assist with impaired dynamic balance, inconsistent step width & length RLE, & decreased weight shifting L. Pt utilized dynavision while standing without BUE support and mod assist with pt frequently demonstrating posterior lean requiring assistance to correct. Pt requires total assist to locate lights on R side of board and still does not attend to them when therapist provides hand over hand (RUE) assist to press lights, but is able to locate & press lights on L side of board. Pt utilized cybex kinetron in standing with BUE support and min assist with task focusing on weight shifting L<>R, dynamic balance, & R NMR. Pt propels w/c with BUE & min assist x 150 ft with task focusing on RUE NMR & strengthening. Pt negotiates 8 steps (3") with B rails & min  assist with assistance for placing RUE on rail. At end of session pt left in w/c with chair alarm donned & sitting at nurses station.  Therapy Documentation Precautions:  Precautions Precaution Comments: recent craioplasty; PEG; trach  Restrictions Weight Bearing Restrictions: No    Therapy/Group: Individual Therapy  [name] 08/23/2018, 12:11 PM

## 2018-08-23 NOTE — Progress Notes (Signed)
Social Work Assessment and Plan   Patient Details  Name: Mark [name] MRN: [id number] Date of Birth: May 29, 1950  Today's Date: 08/23/2018  Problem List:  Patient Active Problem List   Diagnosis Date Noted  . 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 CKD (chronic kidney disease), stage III ([place])   . Acute blood loss anemia   . Diabetes mellitus type 2 in nonobese (HCC)   . Essential hypertension   . Global aphasia   . Acute on chronic respiratory failure with hypoxia ([place])   . Chronic kidney disease, stage III (moderate) (HCC)   . Nontraumatic intracranial hemorrhage, unspecified ([place])   . Altered mental status, unspecified   . S/P AAA repair 02/01/2018   Past Medical History:  Past Medical History:  Diagnosis Date  . Acute on chronic respiratory failure with hypoxia ([place])   . Altered mental status, unspecified   . Chronic kidney disease    CKD stage 3  . Chronic kidney disease, stage III (moderate) (HCC)   . Concussion    multiple in high school  . Coronary artery disease    pt denies  . Diabetes mellitus without complication (HCC)    borderline  . Hypertension   . Nontraumatic intracranial hemorrhage, unspecified ([place])   . PVD (peripheral vascular disease) (HCC)    leg stents femoral artery  . Stroke [facility])    3 years ago- no residual   Past Surgical History:  Past Surgical History:  Procedure Laterality Date  . ABDOMINAL AORTIC ENDOVASCULAR STENT GRAFT N/A 02/01/2018   Procedure: ABDOMINAL AORTIC ENDOVASCULAR STENT GRAFT;  Surgeon: Serafina Mitchell, MD;  Location: [place];  Service: Vascular;  Laterality: N/A;  . NECK SURGERY    . VEIN BYPASS SURGERY     Social History:  reports that he has been smoking cigarettes. He has been smoking about 1.50 packs per day. He has never used smokeless tobacco. He reports that he does not drink alcohol or use drugs.  Family / Support Systems Marital Status: Widow/Widower How Long?: Oct 2014 Patient Roles: Parent,  Other (Comment)(grandparent) Children: daughter, Franne Forts @ [phone];  son, Shinichi Anguiano @ [phone] Anticipated Caregiver: son and daugther + hired assistance as needed Ability/Limitations of Caregiver: Min A Caregiver Availability: 24/7 Family Dynamics: Daughter very concerned and with questions about her father's status. Very committed to providing assistance upon d/c and notes she and her brother are working things out to share responsibilities.  Social History Preferred language: English Religion: Methodist Cultural Background: NA Read: Yes Write: Yes Employment Status: Employed Name of Employer: Works as a Water quality scientist with a [employer] Return to Work Plans: doubtful Public relations account executive Issues: None Guardian/Conservator: None - per MD, pt is not capable of making decisions - defer to adult children   Abuse/Neglect Abuse/Neglect Assessment Can Be Completed: Yes Physical Abuse: Denies Verbal Abuse: Denies Sexual Abuse: Denies Exploitation of patient/patient's resources: Denies Self-Neglect: Denies  Emotional Status Pt's affect, behavior and adjustment status: Pt sitting up in w/c, however, unable to complete assessment interview due to significant cognitive and speech impairments.  Assessment information provided by daughter, Thomes Dinning.  Cannot assess emotional status at this time.  Will monitor as cognition/ speech improve. Will likley refer for neuropsychology while here. Recent Psychosocial Issues: None Psychiatric History: None Substance Abuse History: none  Patient / Family Perceptions, Expectations & Goals Pt/Family understanding of illness & functional limitations: Cannot assess pt's awareness.  Daughter with good, general understanding of medical issues and current  functional and cognitive limitations/ need for CIR. Premorbid pt/family roles/activities: Pt completely independent and working f/t Anticipated changes in  roles/activities/participation: Anticipate pt will require 24/7 supervision at a minimum.  Daughter and son to share in providing 24/7 support. Pt/family expectations/goals: "We just hope he can get back and forth to the bathroom and do some things on his own."  [employer]: None Premorbid Home Care/DME Agencies: None Transportation available at discharge: yes Resource referrals recommended: Neuropsychology, Support group (specify)  Discharge Planning Living Arrangements: Alone Support Systems: Children Type of Residence: Private residence Insurance Resources: Medicare(Aetna Medicare) Financial Resources: Employment, Restaurant manager, fast food Screen Referred: No Living Expenses: Own Money Management: Patient Does the patient have any problems obtaining your medications?: No Home Management: pt Patient/Family Preliminary Plans: Pt to return to his own home with adult son and daughter alternating 24/7 support. Social Work Anticipated Follow Up Needs: HH/OP Expected length of stay: 10-12 days  Clinical Impression Unfortunate gentleman here following an AVM rupture with ICH.  Pt with significant cognitive and expressive deficits and daughter providing assessment/ intake information.  She reports that she and her brother are planning to "share" in provision of 24/7 support to pt at d/c.  Will follow for support and d/c planning needs.  Perpetua Elling 08/23/2018, 11:52 AM

## 2018-08-23 NOTE — Progress Notes (Signed)
Modified Barium Swallow Progress Note  Patient Details  Name: Mark [name] MRN: [id number] Date of Birth: 01/04/1951  Today's Date: 08/23/2018  Modified Barium Swallow completed.  Full report located under Chart Review in the Imaging Section.  Brief recommendations include the following:  Clinical Impression  Pt presents with primary oral phase dysphagia that is c/b decreased bolus cohesion, difficulty with posterior propolsion resulting in lengthy oral phase and  occasional episodes of premature spillage and piecemeal spillage. However pt is able to decreased oral deficits when consuming thin liquids via straw. Despite oral phase deficits, pt with timely swallow initiation and complete airway protection throughout study. Pt with with very trace pharyngela resiude that didn't pose any risk of aspiration during study. Recommend regular diet with thin liquids (via straw to prevent oral delays), medicine crushed in puree and full supervision.   Swallow Evaluation Recommendations       SLP Diet Recommendations: Regular solids;Thin liquid   Liquid Administration via: Straw   Medication Administration: Crushed with puree   Supervision: Patient able to self feed;Staff to assist with self feeding;Full supervision/cueing for compensatory strategies   Compensations: Minimize environmental distractions;Slow rate;Small sips/bites   Postural Changes: Seated upright at 90 degrees   Oral Care Recommendations: Oral care BID        [name] 08/23/2018,12:33 PM

## 2018-08-24 ENCOUNTER — Inpatient Hospital Stay (HOSPITAL_COMMUNITY): Payer: Medicare HMO | Admitting: Physical Therapy

## 2018-08-24 ENCOUNTER — Inpatient Hospital Stay (HOSPITAL_COMMUNITY): Payer: Medicare HMO | Admitting: Speech Pathology

## 2018-08-24 ENCOUNTER — Inpatient Hospital Stay (HOSPITAL_COMMUNITY): Payer: Medicare HMO

## 2018-08-24 LAB — GLUCOSE, CAPILLARY
Glucose-Capillary: 115 mg/dL — ABNORMAL HIGH (ref 70–99)
Glucose-Capillary: 118 mg/dL — ABNORMAL HIGH (ref 70–99)
Glucose-Capillary: 125 mg/dL — ABNORMAL HIGH (ref 70–99)
Glucose-Capillary: 131 mg/dL — ABNORMAL HIGH (ref 70–99)
Glucose-Capillary: 93 mg/dL (ref 70–99)

## 2018-08-24 NOTE — Progress Notes (Signed)
Physical Therapy Session Note  Patient Details  Name: Mark [name] MRN: [id number] Date of Birth: 08-28-1950  Today's Date: 08/24/2018 PT Individual Time: 4496-7591 PT Individual Time Calculation (min): 32 min  (missed 28 minutes due to refusal)  Short Term Goals: Week 1:  PT Short Term Goal 1 (Week 1): pt will transfer consistently bed>< w/c with supervision PT Short Term Goal 2 (Week 1): pt will perform gait with LRAD x 50' including turns PT Short Term Goal 3 (Week 1): pt will negotiate 12 steps 2 rails with close supervision PT Short Term Goal 4 (Week 1): pt will negotiate 1 curb/step with min assist and LRAD  Skilled Therapeutic Interventions/Progress Updates:   Pt received in bed; therapist cuing pt to transition to EOB for therapy pt began to shake his head and saying, "No more, I can't."  Attempted to explain that this was his final therapy for today and that he would not have any therapy tomorrow (rest day) pt continued to say, "I can't, I'm done."  When RN entered pt asked about going to the bathroom.  Therapist agreed to assist and then attempt to redirect pt to therapy gym.  Pt transitioned to EOB with supervision and sat EOB with supervision to don each shoe.  Pt able to tie shoe on R foot but not L.  Ambulated to/from bathroom with min HHA.  Provided steadying assist while pt performed doffing and donning of lower body clothing.  Pt stood at sink for hand washing with therapist providing cues for sequencing and to locate soap on R side of sink.  Attempted to redirect pt to hallway but he turned and returned directly to the bed and into supine.  Attempted to motivate pt to go "take a walk" but again, pt reported, "I think I'm done."  Cued pt to remove shoes and scoot up in bed with supervision.  Vitals assessed: all WNL.  Pt left in bed with all items within reach.  RN alerted to pt refusal and that this is atypical for patient.  Therapy Documentation Precautions:   Precautions Precaution Comments: recent craioplasty; PEG; trach  Restrictions Weight Bearing Restrictions: No General: PT Amount of Missed Time (min): 28 Minutes PT Missed Treatment Reason: Patient unwilling to participate Vital Signs: Therapy Vitals Temp: 98.5 F (36.9 C) Temp Source: Oral Pulse Rate: 70 Resp: 18 BP: (!) 144/80 Patient Position (if appropriate): Lying Oxygen Therapy SpO2: 98 % O2 Device: Room Air Patient Activity (if Appropriate): In bed Pain: Pain Assessment Faces Pain Scale: No hurt   Therapy/Group: Individual Therapy  Rico Junker, PT, DPT 08/24/18    3:37 PM    08/24/2018, 3:37 PM

## 2018-08-24 NOTE — Progress Notes (Signed)
Speech Language Pathology Daily Session Note  Patient Details  Name: Mark [name] MRN: [id number] Date of Birth: 1950/10/13  Today's Date: 08/24/2018 SLP Individual Time: 0800-0840 SLP Individual Time Calculation (min): 40 min  Short Term Goals: Week 1: SLP Short Term Goal 1 (Week 1): Pt will demonstrate sustained attention to task for 10 minutes with Min A cues. SLP Short Term Goal 2 (Week 1): With SLP positioned on pt's right, pt will scan to task in 4 out of 10 opportunities with Max A cues. SLP Short Term Goal 3 (Week 1): Dysphagia goals to added after completion of instrumental study on 08/23/18 SLP Short Term Goal 4 (Week 1): Speech-language goals to be added after additional session.  Skilled Therapeutic Interventions: Patient received skilled SLP services targeting aphasia. Patient responded to basic biographical yes/no questions on 2/10 trials. Patient required max verbal, visual, and tactile cues to follow basic 1 step directions with 20% accuracy. Patient identified family members in a picture book created by his family with 20% accuracy when provided binary choices. At the end of therapy session patient was upright in bed, bed alarm activated, and all needs within reach.  Pain Pain Assessment Pain Scale: Faces Faces Pain Scale: No hurt  Therapy/Group: Individual Therapy  Cristy Folks 08/24/2018, 8:44 AM

## 2018-08-24 NOTE — Progress Notes (Signed)
Occupational Therapy Session Note  Patient Details  Name: Rollins Wrightson MRN: [id number] Date of Birth: 13-Aug-1950  Today's Date: 08/24/2018 OT Individual Time: 4193-7902 OT Individual Time Calculation (min): 72 min    Short Term Goals: Week 1:  OT Short Term Goal 1 (Week 1): Pt will fasten B shoes wiht Supervision OT Short Term Goal 2 (Week 1): Pt will locate 3/3 items on R wiht min VC OT Short Term Goal 3 (Week 1): Pt will complete toilet transfer wiht Supervision and LRAD  Skilled Therapeutic Interventions/Progress Updates:    1;1. Pt received in bed with RN present and reporting LE pain. Rn delivers pain medication. Pt completes stand pivot transfer with MIN A to w/c with visual cues. Pt completes bathing and dressing with items placed on R with MOD VC for locating items for R attention and min A for standing balance during tasks. Pt requires visual cues for sequencing bathing body parts. Pt requires significantly increased time to fasten shoes L shoe, but unable to fasten R despite multiple attempts and OT places foot on stool to improve access to foot. Pt ambaultes into bathroom with MIN HHA of RUE and requires VC for CM prior to sitting onto toilet. Pt requires increased A to walk out of bahroom holding HHA with LUE with pt scissoring LEs. Pt completes ambulation part way to/from tx gym ~50 feet with pt leaning, nearly pushing into OT on R with R HHA. Pt unaware d/t decreased proprioception. Pt completes fastening 5 buttons with increased time and demonstration to improve [facility] requried for ADLs. Exited session with pt seated in bed, exit alarm on and call light in reach  Therapy Documentation Precautions:  Precautions Precaution Comments: recent craioplasty; PEG; trach  Restrictions Weight Bearing Restrictions: No General:   Vital Signs: Therapy Vitals Pulse Rate: 70 Resp: 16 Oxygen Therapy SpO2: 98 % O2 Device: Room Air Pain: Pain Assessment Pain Scale: Faces Faces Pain  Scale: No hurt ADL:   Vision   Perception    Praxis   Exercises:   Other Treatments:     Therapy/Group: Individual Therapy  Tonny Branch 08/24/2018, 10:13 AM

## 2018-08-24 NOTE — Progress Notes (Signed)
Speech Language Pathology Daily Session Note  Patient Details  Name: Mark [name] MRN: [id number] Date of Birth: August 10, 1950   Date of Service: 08/23/2018 SLP Individual Time: 1250 33 SLP Individual Time Calculation (min): 10 min  Short Term Goals: Week 1: SLP Short Term Goal 1 (Week 1): Pt will demonstrate sustained attention to task for 10 minutes with Min A cues. SLP Short Term Goal 2 (Week 1): With SLP positioned on pt's right, pt will scan to task in 4 out of 10 opportunities with Max A cues. SLP Short Term Goal 3 (Week 1): Pt will consume regular diet textures with thin liquids and minimal overt s/s of aspiration. SLP Short Term Goal 3 - Progress (Week 1): Updated due to goal met SLP Short Term Goal 4 (Week 1): Pt will utlizie multimodal communication to  communicate wants and needs with Max A cues. SLP Short Term Goal 4 - Progress (Week 1): Updated due to goal met  Skilled Therapeutic Interventions:  Skilled treatment session focused on dysphagia goals. In light of recent MBS, pt's diet upgraded but given fact that pt demonstrates significant cognitive linguistic deficits, this writer placed pt on dysphagia 2 diet. SLP provided skilled observation of pt consuming prescribed diet. Pt with initial attempts to use fingers to self-feed and required Total A to self-feed as well as aid in visual-perceptual ability to locate food and coordinate placing in mouth. Pt handed off to OT.       Pain Pain Assessment Pain Scale: Faces Faces Pain Scale: No hurt  Therapy/Group: Individual Therapy  Sapphire Tygart 08/24/2018, 9:43 AM

## 2018-08-24 NOTE — IPOC Note (Signed)
Overall Plan of Care [facility]) Patient Details Name: Mark [name] MRN: [id number] DOB: August 05, 1950  Admitting Diagnosis: <principal problem not specified>  Hospital Problems: Active Problems:   Left temporal lobe hemorrhage (HCC)   CKD (chronic kidney disease), stage III (HCC)   Acute blood loss anemia   Diabetes mellitus type 2 in nonobese [facility])   Essential hypertension   Global aphasia     Functional Problem List: Nursing Bladder, Bowel, Endurance, Medication Management, Nutrition, Pain, Safety  PT Balance, Perception, Endurance, Safety, Motor  OT Balance, Cognition, Endurance, Motor, Pain, Nutrition, Perception, Safety, Sensory, Vision  SLP Cognition, Linguistic, Nutrition, Perception, Safety  TR         Basic ADL's: OT Eating, Grooming, Bathing, Dressing, Toileting     Advanced  ADL's: OT       Transfers: PT Bed Mobility, Bed to Chair, Car, Occupational psychologist, Research scientist (life sciences): PT Ambulation, Other (comment), Wheelchair Mobility, Stairs     Additional Impairments: OT Fuctional Use of Upper Extremity  SLP Swallowing, Communication, Social Cognition comprehension Attention, Problem Solving, Awareness  TR      Anticipated Outcomes Item Anticipated Outcome  Self Feeding S  Swallowing  Mod A   Basic self-care  S  Toileting  S   Bathroom Transfers S  Bowel/Bladder  Min assist  Transfers  supervision basic and car  Locomotion  supervision gait x 150' wiht LRAD, up/down 12 steps 2 rails, CGA up/down 2 steps no rails for home entry  Communication  Max A  Cognition  Mod A  Pain  <3 on a 0-10 pain scale  Safety/Judgment  mod assist appropriate equipment   Therapy Plan: PT Intensity: Minimum of 1-2 x/day ,45 to 90 minutes PT Frequency: 5 out of 7 days PT Duration Estimated Length of Stay: 12-14 OT Intensity: Minimum of 1-2 x/day, 45 to 90 minutes OT Frequency: 5 out of 7 days OT Duration/Estimated Length of Stay: 10-12 SLP Intensity:  Minumum of 1-2 x/day, 30 to 90 minutes SLP Frequency: 3 to 5 out of 7 days SLP Duration/Estimated Length of Stay: 10 to 12 days   Due to the current state of emergency, patients may not be receiving their 3-hours of Medicare-mandated therapy.   Team Interventions: Nursing Interventions Patient/Family Education, Bladder Management, Bowel Management, Disease Management/Prevention, Discharge Planning, Pain Management, Psychosocial Support, Medication Management  PT interventions Ambulation/gait training, Cognitive remediation/compensation, Discharge planning, DME/adaptive equipment instruction, Functional mobility training, Pain management, Psychosocial support, Splinting/orthotics, Therapeutic Activities, UE/LE Strength taining/ROM, Visual/perceptual remediation/compensation, Warden/ranger, Community reintegration, Development worker, international aid stimulation, Neuromuscular re-education, Patient/family education, Museum/gallery curator, Therapeutic Exercise, UE/LE Coordination activities, Wheelchair propulsion/positioning  OT Interventions Warden/ranger, Discharge planning, Pain management, Self Care/advanced ADL retraining, Therapeutic Activities, UE/LE Coordination activities, Cognitive remediation/compensation, Functional mobility training, Disease mangement/prevention, Patient/family education, Skin care/wound managment, Therapeutic Exercise, Visual/perceptual remediation/compensation, Firefighter, Fish farm manager, Neuromuscular re-education, Psychosocial support, Splinting/orthotics, UE/LE Strength taining/ROM, Wheelchair propulsion/positioning  SLP Interventions Functional tasks, Patient/family education, Dysphagia/aspiration precaution training, Speech/Language facilitation  TR Interventions    SW/CM Interventions Discharge Planning, Psychosocial Support, Patient/Family Education   Barriers to Discharge MD  Medical stability  Nursing Decreased caregiver  support, Medical stability, Home environment access/layout, Trach, Incontinence, Medication compliance, Lack of/limited family support, Nutrition means    PT Decreased caregiver support    OT Decreased caregiver support, Lack of/limited family support, Trach, Incontinence    SLP      SW       Team Discharge Planning: Destination: PT-Home ,OT-  Home , SLP-Home Projected Follow-up: PT-Home health PT, OT-  Home health OT, SLP-Outpatient SLP, 24 hour supervision/assistance Projected Equipment Needs: PT-To be determined, OT- 3 in 1 bedside comode, Tub/shower bench, SLP-To be determined Equipment Details: PT- , OT-  Patient/family involved in discharge planning: PT- Patient,  OT-Patient, SLP-Patient unable/family or caregive not available  MD ELOS: 12-14 days Medical Rehab Prognosis:  Excellent Assessment: The patient has been admitted for CIR therapies with the diagnosis of left ICH. The team will be addressing functional mobility, strength, stamina, balance, safety, adaptive techniques and equipment, self-care, bowel and bladder mgt, patient and caregiver education, NMR, language, swallowing, pain control, behavior. Goals have been set at supervision to min assist with basic mobility and self-care and min to mod assist with cognition and language, aphasia is severe.   Due to the current state of emergency, patients may not be receiving their 3 hours per day of Medicare-mandated therapy.    Meredith Staggers, MD, FAAPMR      See Team Conference Notes for weekly updates to the plan of care

## 2018-08-24 NOTE — Progress Notes (Signed)
[place] PHYSICAL MEDICINE & REHABILITATION PROGRESS NOTE   Subjective/Complaints: Tried to get out of bed to bathroom last night. Suffered a few cuts to arms.   ROS: limited due to language/communication    Objective:   Dg Swallowing Func-speech Pathology  Result Date: 08/23/2018 Objective Swallowing Evaluation: Type of Study: Bedside Swallow Evaluation  Patient Details Name: Mark [name] MRN: [id number] Date of Birth: [birth year] Today's Date: 08/23/2018 Time: No data recorded-No data recorded No data recorded Past Medical History: Past Medical History: Diagnosis Date . Acute on chronic respiratory failure with hypoxia ([place])  . Altered mental status, unspecified  . Chronic kidney disease   CKD stage 3 . Chronic kidney disease, stage III (moderate) (HCC)  . Concussion   multiple in high school . Coronary artery disease   pt denies . Diabetes mellitus without complication (HCC)   borderline . Hypertension  . Nontraumatic intracranial hemorrhage, unspecified ([place])  . PVD (peripheral vascular disease) (HCC)   leg stents femoral artery . Stroke [facility])   3 years ago- no residual Past Surgical History: Past Surgical History: Procedure Laterality Date . ABDOMINAL AORTIC ENDOVASCULAR STENT GRAFT N/A 02/01/2018  Procedure: ABDOMINAL AORTIC ENDOVASCULAR STENT GRAFT;  Surgeon: Serafina Mitchell, MD;  Location: [place];  Service: Vascular;  Laterality: N/A; . NECK SURGERY   . VEIN BYPASS SURGERY   No data recorded No data recorded Assessment / Plan / Recommendation CHL IP CLINICAL IMPRESSIONS 08/23/2018 Clinical Impression Pt presents with primary oral phase dysphagia that is c/b decreased bolus cohesion, difficulty with posterior propolsion resulting in lengthy oral phase and  occasional episodes of premature spillage and piecemeal spillage. However pt is able to decreased oral deficits when consuming thin liquids via straw. Despite oral phase deficits, pt with timely swallow initiation and complete airway  protection throughout study. Pt with with very trace pharyngela resiude that didn't pose any risk of aspiration during study. Recommend regular diet with thin liquids (via straw to prevent oral delays), medicine crushed in puree and full supervision. SLP Visit Diagnosis Dysphagia, oral phase (R13.11) Attention and concentration deficit following -- Frontal lobe and executive function deficit following -- Impact on safety and function No limitations   CHL IP TREATMENT RECOMMENDATION 08/23/2018 Treatment Recommendations Therapy as outlined in treatment plan below   No flowsheet data found. CHL IP DIET RECOMMENDATION 08/23/2018 SLP Diet Recommendations Regular solids;Thin liquid Liquid Administration via Straw Medication Administration Crushed with puree Compensations Minimize environmental distractions;Slow rate;Small sips/bites Postural Changes Seated upright at 90 degrees   CHL IP OTHER RECOMMENDATIONS 08/23/2018 Recommended Consults -- Oral Care Recommendations Oral care BID Other Recommendations --   No flowsheet data found.  No flowsheet data found.     CHL IP ORAL PHASE 08/23/2018 Oral Phase Impaired Oral - Pudding Teaspoon -- Oral - Pudding Cup -- Oral - Honey Teaspoon -- Oral - Honey Cup -- Oral - Nectar Teaspoon Lingual pumping;Weak lingual manipulation;Holding of bolus;Reduced posterior propulsion;Delayed oral transit;Decreased bolus cohesion;Premature spillage;Piecemeal swallowing Oral - Nectar Cup Weak lingual manipulation;Lingual pumping;Reduced posterior propulsion;Holding of bolus;Piecemeal swallowing;Delayed oral transit;Decreased bolus cohesion;Premature spillage Oral - Nectar Straw -- Oral - Thin Teaspoon Weak lingual manipulation;Lingual pumping;Reduced posterior propulsion;Holding of bolus;Piecemeal swallowing;Delayed oral transit;Decreased bolus cohesion;Premature spillage Oral - Thin Cup Weak lingual manipulation;Lingual pumping;Reduced posterior propulsion;Holding of bolus;Piecemeal  swallowing;Delayed oral transit;Decreased bolus cohesion;Premature spillage Oral - Thin Straw Premature spillage Oral - Puree -- Oral - Mech Soft Weak lingual manipulation;Lingual pumping;Reduced posterior propulsion;Holding of bolus;Decreased bolus cohesion Oral - Regular -- Oral -  Multi-Consistency Impaired mastication;Weak lingual manipulation;Lingual pumping;Reduced posterior propulsion;Holding of bolus;Piecemeal swallowing;Delayed oral transit;Decreased bolus cohesion;Premature spillage Oral - Pill Holding of bolus;Lingual pumping;Decreased bolus cohesion;Delayed oral transit Oral Phase - Comment --  CHL IP PHARYNGEAL PHASE 08/23/2018 Pharyngeal Phase Impaired Pharyngeal- Pudding Teaspoon -- Pharyngeal -- Pharyngeal- Pudding Cup -- Pharyngeal -- Pharyngeal- Honey Teaspoon -- Pharyngeal -- Pharyngeal- Honey Cup -- Pharyngeal -- Pharyngeal- Nectar Teaspoon Pharyngeal residue - pyriform;Pharyngeal residue - valleculae Pharyngeal -- Pharyngeal- Nectar Cup Pharyngeal residue - valleculae;Pharyngeal residue - pyriform Pharyngeal -- Pharyngeal- Nectar Straw -- Pharyngeal -- Pharyngeal- Thin Teaspoon Pharyngeal residue - valleculae;Pharyngeal residue - pyriform Pharyngeal -- Pharyngeal- Thin Cup Pharyngeal residue - valleculae;Pharyngeal residue - pyriform;Penetration/Aspiration during swallow Pharyngeal Material enters airway, remains ABOVE vocal cords then ejected out Pharyngeal- Thin Straw -- Pharyngeal -- Pharyngeal- Puree Delayed swallow initiation-vallecula Pharyngeal -- Pharyngeal- Mechanical Soft WFL Pharyngeal -- Pharyngeal- Regular -- Pharyngeal -- Pharyngeal- Multi-consistency WFL;Delayed swallow initiation-vallecula Pharyngeal -- Pharyngeal- Pill WFL Pharyngeal -- Pharyngeal Comment --  CHL IP CERVICAL ESOPHAGEAL PHASE 08/23/2018 Cervical Esophageal Phase WFL Pudding Teaspoon -- Pudding Cup -- Honey Teaspoon -- Honey Cup -- Nectar Teaspoon -- Nectar Cup -- Nectar Straw -- Thin Teaspoon -- Thin Cup -- Thin  Straw -- Puree -- Mechanical Soft -- Regular -- Multi-consistency -- Pill -- Cervical Esophageal Comment -- Happi Overton 08/23/2018, 3:23 PM              [facility] Lower Extremity Venous (dvt)  Result Date: 08/22/2018  Lower Venous Study Indications: Edema.  Risk Factors: None identified. Limitations: Patient positioning. Comparison Study: No prior studies. Performing Technologist: Chanda BusingGregory Collins RVT  Examination Guidelines: A complete evaluation includes B-mode imaging, spectral Doppler, color Doppler, and power Doppler as needed of all accessible portions of each vessel. Bilateral testing is considered an integral part of a complete examination. Limited examinations for reoccurring indications may be performed as noted.  +---------+---------------+---------+-----------+----------+-------+ RIGHT    CompressibilityPhasicitySpontaneityPropertiesSummary +---------+---------------+---------+-----------+----------+-------+ CFV      Full           Yes      Yes                          +---------+---------------+---------+-----------+----------+-------+ SFJ      Full                                                 +---------+---------------+---------+-----------+----------+-------+ FV Prox  Full                                                 +---------+---------------+---------+-----------+----------+-------+ FV Mid   Full                                                 +---------+---------------+---------+-----------+----------+-------+ FV DistalFull                                                 +---------+---------------+---------+-----------+----------+-------+ PFV  Full                                                 +---------+---------------+---------+-----------+----------+-------+ POP      Full           Yes      Yes                          +---------+---------------+---------+-----------+----------+-------+ PTV      Full                                                  +---------+---------------+---------+-----------+----------+-------+ PERO     Full                                                 +---------+---------------+---------+-----------+----------+-------+   +---------+---------------+---------+-----------+----------+-------+ LEFT     CompressibilityPhasicitySpontaneityPropertiesSummary +---------+---------------+---------+-----------+----------+-------+ CFV      Full           Yes      Yes                          +---------+---------------+---------+-----------+----------+-------+ SFJ      Full                                                 +---------+---------------+---------+-----------+----------+-------+ FV Prox  Full                                                 +---------+---------------+---------+-----------+----------+-------+ FV Mid   Full                                                 +---------+---------------+---------+-----------+----------+-------+ FV DistalFull                                                 +---------+---------------+---------+-----------+----------+-------+ PFV      Full                                                 +---------+---------------+---------+-----------+----------+-------+ POP      Full           Yes      Yes                          +---------+---------------+---------+-----------+----------+-------+ PTV      Full                                                 +---------+---------------+---------+-----------+----------+-------+  PERO     Full                                                 +---------+---------------+---------+-----------+----------+-------+     Summary: Right: There is no evidence of deep vein thrombosis in the lower extremity. No cystic structure found in the popliteal fossa. Left: There is no evidence of deep vein thrombosis in the lower extremity. No cystic structure found in the popliteal fossa.   *See table(s) above for measurements and observations. Electronically signed by Lemar Livings MD on 08/22/2018 at 1:12:23 PM.    Final    Recent Labs    08/22/18 0501  WBC 8.1  HGB 10.6*  HCT 33.3*  PLT 337   Recent Labs    08/22/18 0501  NA 139  K 3.8  CL 104  CO2 27  GLUCOSE 126*  BUN 24*  CREATININE 1.23  CALCIUM 9.2    Intake/Output Summary (Last 24 hours) at 08/24/2018 1045 Last data filed at 08/24/2018 0906 Gross per 24 hour  Intake 180 ml  Output 300 ml  Net -120 ml     Physical Exam: Vital Signs Blood pressure (!) 155/72, pulse 70, temperature 98.8 F (37.1 C), temperature source Oral, resp. rate 16, height 5\' 6"  (1.676 m), weight 54.8 kg, SpO2 98 %. Constitutional: No distress . Vital signs reviewed. HEENT: EOMI, oral membranes moist Neck: supple Cardiovascular: RRR without murmur. No JVD    Respiratory: CTA Bilaterally without wheezes or rales. Normal effort    GI: BS +, non-tender, non-distended  Musculoskeletal:     Comments: full PROM Neurological: He is alert.  Ongoing receptive aphasia. Moves all 4's at least 4/5. Seems to sense pain.   Skin: a few lacs/bruises on bilateral arms Psychiatric:  Calm     Assessment/Plan: 1. 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  Physiatrist is providing close team supervision and 24 hour management of active medical problems listed below.  Physiatrist and rehab team continue to assess barriers to discharge/monitor patient progress toward functional and medical goals  Care Tool:  Bathing    Body parts bathed by patient: Right arm, Left arm, Chest, Abdomen, Front perineal area, Buttocks, Right upper leg, Left upper leg, Right lower leg, Left lower leg, Face         Bathing assist Assist Level: Minimal Assistance - Patient > 75%     Upper Body Dressing/Undressing Upper body dressing   What is  the patient wearing?: Pull over shirt, Hospital gown only    Upper body assist Assist Level: Minimal Assistance - Patient > 75%    Lower Body Dressing/Undressing Lower body dressing      What is the patient wearing?: Incontinence brief, Pants     Lower body assist Assist for lower body dressing: Minimal Assistance - Patient > 75%     Toileting Toileting    Toileting assist Assist for toileting: Moderate Assistance - Patient 50 - 74%     Transfers Chair/bed transfer  Transfers assist  Chair/bed transfer activity did not occur: N/A  Chair/bed transfer assist level: Minimal Assistance - Patient > 75%     Locomotion Ambulation   Ambulation assist      Assist level: Moderate Assistance - Patient 50 - 74% Assistive device:  Hand held assist Max distance: 200 ft   Walk 10 feet activity   Assist  Walk 10 feet activity did not occur: Safety/medical concerns(dynanuc balance deficits)  Assist level: Moderate Assistance - Patient - 50 - 74% Assistive device: Hand held assist   Walk 50 feet activity   Assist Walk 50 feet with 2 turns activity did not occur: Safety/medical concerns(dynamic balance deficits)  Assist level: Moderate Assistance - Patient - 50 - 74% Assistive device: Hand held assist    Walk 150 feet activity   Assist Walk 150 feet activity did not occur: Safety/medical concerns(dynamic balance deficits)  Assist level: Moderate Assistance - Patient - 50 - 74% Assistive device: Hand held assist    Walk 10 feet on uneven surface  activity   Assist Walk 10 feet on uneven surfaces activity did not occur: Safety/medical concerns(dynamic balance deficits)         Wheelchair     Assist Will patient use wheelchair at discharge?: No Type of Wheelchair: Manual    Wheelchair assist level: Minimal Assistance - Patient > 75% Max wheelchair distance: 150 ft    Wheelchair 50 feet with 2 turns activity    Assist    Wheelchair 50 feet  with 2 turns activity did not occur: Safety/medical concerns(unable to motor plan)   Assist Level: Minimal Assistance - Patient > 75%   Wheelchair 150 feet activity     Assist Wheelchair 150 feet activity did not occur: Safety/medical concerns(unable to motor plan)   Assist Level: Minimal Assistance - Patient > 75%    Medical Problem List and Plan: 1.  Deficits with mobility, self-care, language, nutrition, cognition, swallowing secondary to left temporal ICH           continue CIR therapies today including PT and OT and SLP  -reiterated to patient need to remain in bed until helped. He seemed to have some comprehension of this 2.  Antithrombotics: -DVT/anticoagulation:  Mechanical: Sequential compression devices, below knee Bilateral lower extremities              Lower extremity Dopplers negative             -antiplatelet therapy: N/A 3. Pain Management: Oxycodone prn.  4. Mood: LCSW to follow for evaluation and support when appropriate. Continue Zoloft and Klonopin for mood stabilization.              -antipsychotic agents:  n/A 5. Neuropsych: This patient is not capable of making decisions on his own behalf.    6. Skin/Wound Care: local care to skin  - Daily PEG and trach care.  7. Fluids/Electrolytes/Nutrition:  Remains NPO  -glucerna 1.2 TF 70cc/hr at HS only   BUN trending down    -advanced to D2/thin diet    -check prealbumin on Monday along with another bmet 8.  HTN: Monitor BP tid- continue amlodipine, hydralazine and lisinopril.    -borderline at present. Follow for pattern 9. T2DM: Diet controlled-Hgb A1c- 5.5 at admission.  Lantus daily and SSI qid prn elevated BS.    -controlled 6/27  10. ABLA: Monitor for signs of bleeding. hgb 10.6 11. CKD stage III: Cr 1.23---stable  Recheck monday 12. Pulmonary: keep trach plugged over weekend.   -if no problems with diet over w/e will decannulate Monday   LOS: 3 days A FACE TO FACE EVALUATION WAS PERFORMED  Ranelle Oyster 08/24/2018, 10:45 AM

## 2018-08-25 LAB — GLUCOSE, CAPILLARY
Glucose-Capillary: 102 mg/dL — ABNORMAL HIGH (ref 70–99)
Glucose-Capillary: 104 mg/dL — ABNORMAL HIGH (ref 70–99)
Glucose-Capillary: 121 mg/dL — ABNORMAL HIGH (ref 70–99)
Glucose-Capillary: 126 mg/dL — ABNORMAL HIGH (ref 70–99)
Glucose-Capillary: 144 mg/dL — ABNORMAL HIGH (ref 70–99)

## 2018-08-25 NOTE — Progress Notes (Signed)
Called to Room [phone] for patient had pulled his trach out. Patient had a number 4 Shiley cuff less trach that has been capped and waiting to be Decannulated on 6/29 possibly. Patient is in no distress and SATs remained mid to upper 90's with HR remaining normal 60's-80's. Patient has good BBS and able to speak with no issues. Site was dressed and covered per protocol and old trach was disposed. Extra trach and other emergency equipment continues to be at bedside incase any adverse event should arise. No issues at this time. Safety Zone being filed by RN for no harm issue and will continue to monitor per protocol.

## 2018-08-25 NOTE — Progress Notes (Signed)
[place] PHYSICAL MEDICINE & REHABILITATION PROGRESS NOTE   Subjective/Complaints: Pt pulled trach out last night. RT did not replace nor was I called re: event. Pt comfortable and in no distress  ROS: Limited due to cognitive/behavioral/language  Objective:   No results found. No results for input(s): WBC, HGB, HCT, PLT in the last 72 hours. No results for input(s): NA, K, CL, CO2, GLUCOSE, BUN, CREATININE, CALCIUM in the last 72 hours.  Intake/Output Summary (Last 24 hours) at 08/25/2018 0955 Last data filed at 08/25/2018 0600 Gross per 24 hour  Intake 730.93 ml  Output -  Net 730.93 ml     Physical Exam: Vital Signs Blood pressure 138/75, pulse 63, temperature (!) 97.4 F (36.3 C), resp. rate 18, height 5\' 6"  (1.676 m), weight 54.8 kg, SpO2 94 %. Constitutional: No distress . Vital signs reviewed. HEENT: EOMI, oral membranes moist Neck: supple, trach stoma dressed Cardiovascular: RRR without murmur. No JVD    Respiratory: CTA Bilaterally without wheezes or rales. Normal effort    GI: BS +, non-tender, non-distended  Musculoskeletal:     Comments: full PROM Neurological: He is alert.  Ongoing significant receptive aphasia. Moves all 4's at least 4/5. Seems to sense pain.   Skin: bruises, lacs on arms Psychiatric:  Calm     Assessment/Plan: 1. 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  Physiatrist is providing close team supervision and 24 hour management of active medical problems listed below.  Physiatrist and rehab team continue to assess barriers to discharge/monitor patient progress toward functional and medical goals  Care Tool:  Bathing    Body parts bathed by patient: Right arm, Left arm, Chest, Abdomen, Front perineal area, Buttocks, Right upper leg, Left upper leg, Right lower leg, Left lower leg, Face         Bathing assist Assist  Level: Contact Guard/Touching assist     Upper Body Dressing/Undressing Upper body dressing   What is the patient wearing?: Pull over shirt    Upper body assist Assist Level: Contact Guard/Touching assist    Lower Body Dressing/Undressing Lower body dressing      What is the patient wearing?: Pants     Lower body assist Assist for lower body dressing: Minimal Assistance - Patient > 75%     Toileting Toileting    Toileting assist Assist for toileting: Contact Guard/Touching assist     Transfers Chair/bed transfer  Transfers assist  Chair/bed transfer activity did not occur: N/A  Chair/bed transfer assist level: Minimal Assistance - Patient > 75%     Locomotion Ambulation   Ambulation assist      Assist level: Minimal Assistance - Patient > 75% Assistive device: Hand held assist Max distance: 10   Walk 10 feet activity   Assist  Walk 10 feet activity did not occur: Safety/medical concerns(dynanuc balance deficits)  Assist level: Minimal Assistance - Patient > 75% Assistive device: Hand held assist   Walk 50 feet activity   Assist Walk 50 feet with 2 turns activity did not occur: Safety/medical concerns(dynamic balance deficits)  Assist level: Moderate Assistance - Patient - 50 - 74% Assistive device: Hand held assist    Walk 150 feet activity   Assist Walk 150 feet activity did not occur: Safety/medical concerns(dynamic balance deficits)  Assist level: Moderate Assistance - Patient - 50 - 74% Assistive device: Hand held assist    Walk 10 feet on uneven  surface  activity   Assist Walk 10 feet on uneven surfaces activity did not occur: Safety/medical concerns(dynamic balance deficits)         Wheelchair     Assist Will patient use wheelchair at discharge?: No Type of Wheelchair: Manual    Wheelchair assist level: Minimal Assistance - Patient > 75% Max wheelchair distance: 150 ft    Wheelchair 50 feet with 2 turns  activity    Assist    Wheelchair 50 feet with 2 turns activity did not occur: Safety/medical concerns(unable to motor plan)   Assist Level: Minimal Assistance - Patient > 75%   Wheelchair 150 feet activity     Assist Wheelchair 150 feet activity did not occur: Safety/medical concerns(unable to motor plan)   Assist Level: Minimal Assistance - Patient > 75%    Medical Problem List and Plan: 1.  Deficits with mobility, self-care, language, nutrition, cognition, swallowing secondary to left temporal ICH           continue CIR therapies today including PT and OT and SLP    2.  Antithrombotics: -DVT/anticoagulation:  Mechanical: Sequential compression devices, below knee Bilateral lower extremities              Lower extremity Dopplers negative             -antiplatelet therapy: N/A 3. Pain Management: Oxycodone prn.  4. Mood: LCSW to follow for evaluation and support when appropriate. Continue Zoloft and Klonopin for mood stabilization.              -antipsychotic agents:  n/A 5. Neuropsych: This patient is not capable of making decisions on his own behalf.    6. Skin/Wound Care: local care to skin  - Daily PEG and trach care.  7. Fluids/Electrolytes/Nutrition:  Remains NPO  -glucerna 1.2 TF 70cc/hr at HS only   BUN trending down    -advanced to D2/thin diet    -check prealbumin on Monday along with another bmet 8.  HTN: Monitor BP tid- continue amlodipine, hydralazine and lisinopril.    -borderline at present. Follow for pattern 9. T2DM: Diet controlled-Hgb A1c- 5.5 at admission.  Lantus daily and SSI qid prn elevated BS.    -controlled 6/27  10. ABLA: Monitor for signs of bleeding. hgb 10.6 11. CKD stage III: Cr 1.23---stable  Recheck monday 12. Pulmonary: pt self-decannulated last night.   -RT did not replace  -leave out, dress wound  -monitor for any pulmonary issues  LOS: 4 days A FACE TO FACE EVALUATION WAS PERFORMED  Meredith Staggers 08/25/2018, 9:55 AM

## 2018-08-26 ENCOUNTER — Inpatient Hospital Stay (HOSPITAL_COMMUNITY): Payer: Medicare HMO | Admitting: Physical Therapy

## 2018-08-26 ENCOUNTER — Inpatient Hospital Stay (HOSPITAL_COMMUNITY): Payer: Medicare HMO

## 2018-08-26 ENCOUNTER — Inpatient Hospital Stay (HOSPITAL_COMMUNITY): Payer: Medicare HMO | Admitting: Speech Pathology

## 2018-08-26 LAB — BASIC METABOLIC PANEL
Anion gap: 9 (ref 5–15)
BUN: 22 mg/dL (ref 8–23)
CO2: 25 mmol/L (ref 22–32)
Calcium: 9.4 mg/dL (ref 8.9–10.3)
Chloride: 103 mmol/L (ref 98–111)
Creatinine, Ser: 1.09 mg/dL (ref 0.61–1.24)
GFR calc Af Amer: >60 mL/min (ref >60)
GFR calc non Af Amer: >60 mL/min (ref >60)
Glucose, Bld: 114 mg/dL — ABNORMAL HIGH (ref 70–99)
Potassium: 4.3 mmol/L (ref 3.5–5.1)
Sodium: 137 mmol/L (ref 135–145)

## 2018-08-26 LAB — CBC
HCT: 32.5 % — ABNORMAL LOW (ref 39.0–52.0)
Hemoglobin: 10.4 g/dL — ABNORMAL LOW (ref 13.0–17.0)
MCH: 27.9 pg (ref 26.0–34.0)
MCHC: 32 g/dL (ref 30.0–36.0)
MCV: 87.1 fL (ref 80.0–100.0)
Platelets: 365 10*3/uL (ref 150–400)
RBC: 3.73 MIL/uL — ABNORMAL LOW (ref 4.22–5.81)
RDW: 17.4 % — ABNORMAL HIGH (ref 11.5–15.5)
WBC: 9.2 10*3/uL (ref 4.0–10.5)
nRBC: 0 % (ref 0.0–0.2)

## 2018-08-26 LAB — GLUCOSE, CAPILLARY
Glucose-Capillary: 111 mg/dL — ABNORMAL HIGH (ref 70–99)
Glucose-Capillary: 112 mg/dL — ABNORMAL HIGH (ref 70–99)
Glucose-Capillary: 113 mg/dL — ABNORMAL HIGH (ref 70–99)
Glucose-Capillary: 138 mg/dL — ABNORMAL HIGH (ref 70–99)
Glucose-Capillary: 22 mg/dL — CL (ref 70–99)
Glucose-Capillary: 86 mg/dL (ref 70–99)

## 2018-08-26 LAB — PREALBUMIN: Prealbumin: 26.2 mg/dL (ref 18–38)

## 2018-08-26 MED ORDER — LISINOPRIL 40 MG PO TABS
40.0000 mg | ORAL_TABLET | Freq: Every day | ORAL | Status: DC
  Administered 2018-08-27 – 2018-08-30 (×4): 40 mg
  Filled 2018-08-26 (×4): qty 1

## 2018-08-26 MED ORDER — GLUCERNA 1.5 CAL PO LIQD
1000.0000 mL | ORAL | Status: DC
  Administered 2018-08-26 – 2018-08-27 (×2): 1000 mL
  Filled 2018-08-26 (×3): qty 1000

## 2018-08-26 MED ORDER — GLUCERNA 1.5 CAL PO LIQD
1000.0000 mL | ORAL | Status: DC
  Filled 2018-08-26: qty 1000

## 2018-08-26 MED ORDER — NON FORMULARY: 60.0000 mL/h | Status: DC

## 2018-08-26 NOTE — Progress Notes (Signed)
Physical Therapy Session Note  Patient Details  Name: Mark [name] MRN: [id number] Date of Birth: 10-30-1950  Today's Date: 08/26/2018 PT Individual Time: 9357-0177 PT Individual Time Calculation (min): 24 min   Short Term Goals: Week 1:  PT Short Term Goal 1 (Week 1): pt will transfer consistently bed>< w/c with supervision PT Short Term Goal 2 (Week 1): pt will perform gait with LRAD x 50' including turns PT Short Term Goal 3 (Week 1): pt will negotiate 12 steps 2 rails with close supervision PT Short Term Goal 4 (Week 1): pt will negotiate 1 curb/step with min assist and LRAD  Skilled Therapeutic Interventions/Progress Updates:   Pt in supine and agreeable to therapy, no c/o pain. Supine>sit w/ supervision and min assist stand pivot transfer to w/c. Pt self-propelled w/c w/ BUEs to day room, 57' w/ supervision, to work on coordination, functional problem solving, and independence w/ locomotion. Verbal, visual, and tactile cues to attend to R environment and obstacle negotiation. Kinetron 5 min x2 @ 50 cm/sec to work on LE strengthening and endurance training, and reciprocal movement pattern. Min cues to attend to task and to understand importance of task. Returned to room and ended session in w/c, all needs in reach.   Therapy Documentation Precautions:  Precautions Precaution Comments: recent craioplasty; PEG; trach  Restrictions Weight Bearing Restrictions: No  Therapy/Group: Individual Therapy  [name] Clent Demark 08/26/2018, 9:34 AM

## 2018-08-26 NOTE — Care Management (Signed)
[facility] Individual Statement of Services  Patient Name:  Mark [name]  Date:  08/26/2018  Welcome to the [facility].  Our goal is to provide you with an individualized program based on your diagnosis and situation, designed to meet your specific needs.  With this comprehensive rehabilitation program, you will be expected to participate in at least 3 hours of rehabilitation therapies Monday-Friday, with modified therapy programming on the weekends.  Your rehabilitation program will include the following services:  Physical Therapy (PT), Occupational Therapy (OT), Speech Therapy (ST), 24 hour per day rehabilitation nursing, Therapeutic Recreaction (TR), Neuropsychology, Case Management (Social Worker), Rehabilitation Medicine, Nutrition Services and Pharmacy Services  Weekly team conferences will be held on Tuesdays to discuss your progress.  Your Social Worker will talk with you frequently to get your input and to update you on team discussions.  Team conferences with you and your family in attendance may also be held.  Expected length of stay: 10-12 days   Overall anticipated outcome: supervision (min/mod speech)  Depending on your progress and recovery, your program may change. Your Social Worker will coordinate services and will keep you informed of any changes. Your Social Worker's name and contact numbers are listed  below.  The following services may also be recommended but are not provided by the [facility] will be made to provide these services after discharge if needed.  Arrangements include referral to agencies that provide these services.  Your insurance has been verified to be:  [employer] Your primary doctor is:  Harlan Stains  Pertinent information will be shared  with your doctor and your insurance company.  Social Worker:  [place], [place] or ([phone]   Information discussed with and copy given to patient by: Lennart Pall, 08/26/2018, 2:10 PM

## 2018-08-26 NOTE — Progress Notes (Signed)
Nutrition Follow-up  RD working remotely.  DOCUMENTATION CODES:   Severe malnutrition in context of chronic illness  INTERVENTION:   Nocturnal tube feeding via PEG: - Glucerna 1.5 @ 60 ml/hr to run over 12 hours from 7:00 PM to 7:00 AM (720 ml total) - Free water per MD/PA, currently 100 ml BID - liquid MVI daily per tube  Nocturnal tube feeding regimen and current free water provides 1080 kcal, 59 grams of protein, and 746 ml of H2O (70% of kcal needs, 79% of protein needs).  - Magic cup TID with meals, each supplement provides 290 kcal and 9 grams of protein  NUTRITION DIAGNOSIS:   Severe Malnutrition related to chronic illness (dysphagia, respiratory failure) as evidenced by moderate fat depletion, severe fat depletion, moderate muscle depletion, severe muscle depletion.  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 Pt underwent left craniectomy for evacuation of hematoma after partial embolization of AVM. Pt was taken back to OR on 07/18/18 for resection of residual AVM with cranioplasty and PEG placement. Had tracheostomy placed on 07/20/18. Pt had difficulty with vent wean due to issues with agitation as well as copious secretions. Pt had issues with somnolence, right hemiparesis as well as aphasia. Pt was transferred to [facility] on 08/01/18 for medical management and vent wean. Pt tolerated vent wean and trach downsized to CFS #4 with plugging on  08/20/18. Pt remains NPO with ice chips and tube feeds ongoing for nutritional support. Pt admitted to CIR on 08/21/18.  6/26 - diet advanced to Dysphagia 2 with thin liquids 6/28 - pt self-decannulated  No new weight since admission. Tube feeding switched to nocturnal after diet advanced on 6/26. RD will adjust to better meet pt's needs.  RD will be using Glucerna 1.5 as Glucerna 1.2 formula is on backorder at this time.  Current TF: Glucerna 1.5 @ 56 ml/hr run over 12 hours nocturnally, free water 100 ml BID  Meal Completion: 0-50% x last 8 meals  Medications reviewed and include: Pepcid, SSI, Lantus 5 units daily, Bacid, liquid MVI, Miralax, thiamine  Labs reviewed. CBG's: 86-138 x 24 hours  Diet Order:   Diet Order            DIET DYS 2 Room service appropriate? Yes; Fluid consistency: Thin  Diet effective now              EDUCATION NEEDS:   No education needs have been identified at this time  Skin:  Skin Assessment: Reviewed RN Assessment  Last BM:  08/26/18 medium type 6  Height:   Ht Readings from Last 1 Encounters:  08/21/18 5\' 6"  (1.676 m)    Weight:   Wt Readings from Last 1 Encounters:  08/21/18 54.8 kg    Ideal Body Weight:  64.5 kg  BMI:  Body mass index is 19.51 kg/m.  Estimated Nutritional Needs:   Kcal:  1550-1750  Protein:  75-90 grams  Fluid:  >/= 1.5 L    Gaynell Face,  MS, RD, LDN Inpatient Clinical Dietitian Pager: [phone] Weekend/After Hours: [phone]

## 2018-08-26 NOTE — Progress Notes (Signed)
PHYSICAL MEDICINE & REHABILITATION PROGRESS NOTE   Subjective/Complaints: Up in bed. Restless, staff telling me he wants to go home  ROS: limited due to language/communication   Objective:   No results found. Recent Labs    08/26/18 0710  WBC 9.2  HGB 10.4*  HCT 32.5*  PLT 365   Recent Labs    08/26/18 0710  NA 137  K 4.3  CL 103  CO2 25  GLUCOSE 114*  BUN 22  CREATININE 1.09  CALCIUM 9.4    Intake/Output Summary (Last 24 hours) at 08/26/2018 1032 Last data filed at 08/26/2018 0845 Gross per 24 hour  Intake 1547.6 ml  Output 0 ml  Net 1547.6 ml     Physical Exam: Vital Signs Blood pressure (!) 158/84, pulse 66, temperature 98.4 F (36.9 C), temperature source Oral, resp. rate 16, height 5\' 6"  (1.676 m), weight 54.8 kg, SpO2 98 %. Constitutional: No distress . Vital signs reviewed. HEENT: EOMI, oral membranes moist Neck: supple, trach stoma closing, no air leak appreciated Cardiovascular: RRR without murmur. No JVD    Respiratory: CTA Bilaterally without wheezes or rales. Normal effort    GI: BS +, non-tender, non-distended  Musculoskeletal:     Comments: full PROM Neurological: He is alert.  Receptive aphasia. Followed more simple commands today. Moves all 4's at least 4 to 4+/5. Seems to sense pain.   Skin: bruises, lacs on arms Psychiatric: a little agitated    Assessment/Plan: 1. 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  Physiatrist is providing close team supervision and 24 hour management of active medical problems listed below.  Physiatrist and rehab team continue to assess barriers to discharge/monitor patient progress toward functional and medical goals  Care Tool:  Bathing    Body parts bathed by patient: Right arm, Left arm, Chest, Abdomen, Front perineal area, Buttocks, Right upper leg, Left upper leg, Right lower  leg, Left lower leg, Face         Bathing assist Assist Level: Contact Guard/Touching assist     Upper Body Dressing/Undressing Upper body dressing   What is the patient wearing?: Pull over shirt    Upper body assist Assist Level: Contact Guard/Touching assist    Lower Body Dressing/Undressing Lower body dressing      What is the patient wearing?: Pants     Lower body assist Assist for lower body dressing: Minimal Assistance - Patient > 75%     Toileting Toileting    Toileting assist Assist for toileting: Contact Guard/Touching assist     Transfers Chair/bed transfer  Transfers assist  Chair/bed transfer activity did not occur: N/A  Chair/bed transfer assist level: Minimal Assistance - Patient > 75%     Locomotion Ambulation   Ambulation assist      Assist level: Minimal Assistance - Patient > 75% Assistive device: Hand held assist Max distance: 10   Walk 10 feet activity   Assist  Walk 10 feet activity did not occur: Safety/medical concerns(dynanuc balance deficits)  Assist level: Minimal Assistance - Patient > 75% Assistive device: Hand held assist   Walk 50 feet activity   Assist Walk 50 feet with 2 turns activity did not occur: Safety/medical concerns(dynamic balance deficits)  Assist level: Moderate Assistance - Patient - 50 - 74% Assistive device: Hand held assist    Walk 150 feet activity   Assist Walk 150 feet activity did not occur:  Safety/medical concerns(dynamic balance deficits)  Assist level: Moderate Assistance - Patient - 50 - 74% Assistive device: Hand held assist    Walk 10 feet on uneven surface  activity   Assist Walk 10 feet on uneven surfaces activity did not occur: Safety/medical concerns(dynamic balance deficits)         Wheelchair     Assist Will patient use wheelchair at discharge?: No Type of Wheelchair: Manual    Wheelchair assist level: Supervision/Verbal cueing Max wheelchair distance: 37'     Wheelchair 50 feet with 2 turns activity    Assist    Wheelchair 50 feet with 2 turns activity did not occur: Safety/medical concerns(unable to motor plan)   Assist Level: Supervision/Verbal cueing   Wheelchair 150 feet activity     Assist Wheelchair 150 feet activity did not occur: Safety/medical concerns(unable to motor plan)   Assist Level: Minimal Assistance - Patient > 75%    Medical Problem List and Plan: 1.  Deficits with mobility, self-care, language, nutrition, cognition, swallowing secondary to left temporal ICH           continue CIR therapies today including PT and OT and SLP    2.  Antithrombotics: -DVT/anticoagulation:  Mechanical: Sequential compression devices, below knee Bilateral lower extremities              Lower extremity Dopplers negative             -antiplatelet therapy: N/A 3. Pain Management: Oxycodone prn.  4. Mood: LCSW to follow for evaluation and support when appropriate. Continue Zoloft and Klonopin for mood stabilization.              -antipsychotic agents:  n/A 5. Neuropsych: This patient is not capable of making decisions on his own behalf.    6. Skin/Wound Care: local care to skin  - continue PEG  care.  7. Fluids/Electrolytes/Nutrition:  Remains NPO  -continue glucerna 1.2 TF 70cc/hr at HS only   -advanced to D2/thin diet    -prealbumin 26.2, BUN now normal 22 8.  HTN: Monitor BP tid- continue amlodipine, hydralazine and lisinopril.    -borderline at present. Increase lisinopril to 40mg  daily 9. T2DM: Diet controlled-Hgb A1c- 5.5 at admission.  Lantus daily and SSI qid prn elevated BS.    -controlled 6/29 10. ABLA: Monitor for signs of bleeding. hgb 10.4 today 11. CKD stage III: Cr 1.23---stable  Recheck monday 12. Pulmonary: pt self-decannulated .   -wound closing    LOS: 5 days A FACE TO FACE EVALUATION WAS PERFORMED  Meredith Staggers 08/26/2018, 10:32 AM

## 2018-08-26 NOTE — Progress Notes (Signed)
Speech Language Pathology Daily Session Note  Patient Details  Name: Mark [name] MRN: [id number] Date of Birth: 01/14/1951  Today's Date: 08/26/2018 SLP Individual Time: 0745-0845 SLP Individual Time Calculation (min): 60 min  Short Term Goals: Week 1: SLP Short Term Goal 1 (Week 1): Pt will demonstrate sustained attention to task for 10 minutes with Min A cues. SLP Short Term Goal 2 (Week 1): With SLP positioned on pt's right, pt will scan to task in 4 out of 10 opportunities with Max A cues. SLP Short Term Goal 3 (Week 1): Pt will consume regular diet textures with thin liquids and minimal overt s/s of aspiration. SLP Short Term Goal 3 - Progress (Week 1): Updated due to goal met SLP Short Term Goal 4 (Week 1): Pt will utlizie multimodal communication to  communicate wants and needs with Max A cues. SLP Short Term Goal 4 - Progress (Week 1): Updated due to goal met  Skilled Therapeutic Interventions:  Skilled treatment sessio focused on communication goals. SLP recieved pt upright in bed and per report, pt had pulled out trach over night with RT leaving trach out. No air leakage observed. Pt appeared very frustrated with being in hospital. 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 With Max A verbal assist pt able to rephrase comments into structurally appropriate responses. Contextual based approprach appears most successful in molding verbal communication. SLP presented to with advanced trials of POs (graham crackers, diced peaches) but pt refused d/t frustration. Despite Max A education and emotional support provided, pt not able to participate in any further structured task within session. Pt left upright in bed, bed alarm on and all needs within reach. Continue with current plan of care.        Pain    Therapy/Group: Individual Therapy    08/26/2018, 8:59 AM   

## 2018-08-26 NOTE — Progress Notes (Signed)
Occupational Therapy Session Note  Patient Details  Name: Mark [name] MRN: [id number] Date of Birth: 16-Oct-1950  Today's Date: 08/26/2018 OT Individual Time: 0699-9672 OT Individual Time Calculation (min): 70 min    Short Term Goals: Week 1:  OT Short Term Goal 1 (Week 1): Pt will fasten B shoes wiht Supervision OT Short Term Goal 2 (Week 1): Pt will locate 3/3 items on R wiht min VC OT Short Term Goal 3 (Week 1): Pt will complete toilet transfer wiht Supervision and LRAD  Skilled Therapeutic Interventions/Progress Updates:    Pt received sitting up in w/c with no c/o pain. Pt completed UB bathing at sink with set up assist and cueing. Pt completed LB bathing in standing with 1 LOB toward the L requiring mod A to correct. Pt donned pants with min A for balance support in standing. (S) to don shirt. Pt reported needing to urinate and completed ambulatory transfer into bathroom with min A. Pt voided urine and returned to w/c. Pt then reported needing to have BM and completed transfer again. Pt able to void bowel and perform peri hygiene with min A for balance. Pt then completed Dyn[employer]ision with moderate cueing for Rt attention. Pt completed 3 trials with increased accuracy each subsequent trial. On average, R reaction time was 10 seconds, compared to 4 on the L. Pt often required tactile cues to turn head completely. Pt was then transported to the ADL apt. Pt stood at the counter and completed visual scanning activity with functional reaching component. Pt required initiation cues to reach into cabinets and moderate to heavy cueing to scan to midline and to R visual field. Pt frequently undershot when reaching with his R UE. Cueing required for increasing BOS with pt frequently scissoring and narrowing his feet. Pt had multiple LOB's requiring mod A to correct. Pt was returned to his room and left supine with all needs met.   Therapy Documentation Precautions:  Precautions Precaution  Comments: recent craioplasty; PEG; trach  Restrictions Weight Bearing Restrictions: No  Therapy/Group: Individual Therapy  Curtis Sites 08/26/2018, 7:28 AM

## 2018-08-26 NOTE — Progress Notes (Signed)
Physical Therapy Session Note  Patient Details  Name: Linus Weckerly MRN: [id number] Date of Birth: 05-Jul-1950  Today's Date: 08/26/2018 PT Individual Time: 5038-8828 PT Individual Time Calculation (min): 57 min   Short Term Goals: Week 1:  PT Short Term Goal 1 (Week 1): pt will transfer consistently bed>< w/c with supervision PT Short Term Goal 2 (Week 1): pt will perform gait with LRAD x 50' including turns PT Short Term Goal 3 (Week 1): pt will negotiate 12 steps 2 rails with close supervision PT Short Term Goal 4 (Week 1): pt will negotiate 1 curb/step with min assist and LRAD  Skilled Therapeutic Interventions/Progress Updates:  Pt received asleep in bed but awakened & agreeable to tx. Assisted pt with donning tennis shoes max assist for time management and pt transfers to sitting EOB with hospital bed features & supervision. Pt expresses need to use restroom and ambulates in room/bathroom without AD but pt frequently grabbing at any stable item to hold with LUE and mod assist overall. Pt performed toilet transfer & clothing management with min assist with continent void. Pt requires cuing to use soap to wash hands at sink. Pt appears to have significant R inattention and question field cut. Transported pt around unit via w/c dependent assist for time management. Pt transfers sit<>stand and w/c<>mat table with min assist. Pt ambulates 125 ft x 2 without AD & min assist with therapist providing manual facilitation to L. While standing EOM pt engaged in reaching overhead to L to obtain clothespins then place them on R with task focusing on weight shifting L & R attention; pt does appear to have a field cut as pt unable to locate clothespin on R side when looking to L. From EOM pt performs sit<>stand without BUE support and min assist (2 sets x 10 reps) for BLE strengthening but pt with significant stability with BLE pushing back on mat table & during 2nd set pt requires mod assist 2/2 posterior  lean/LOB.  Pt performed alternating taps to colored targets on floor, progressing to 2" step taps with up to mod assist for balance with task focusing on weight shifting L<>R & dynamic balance, with improvement in weight shifting L noted during task. Pt propelled w/c back to room with mod assist for steering, with pt using BLE with focus on R NMR, BLE strengthening & reciprocal movements. Pt transfers w/c>bed with min assist stand pivot and doffs shoes with supervision, returning supine with supervision. Pt left in bed with alarm set & call bell in reach, NT entering room.  Most successful with providing demonstration/visual cuing for activities during session.  Pt frequently pulling at PEG tube and therapist educated him on purpose and not to pull at tubing.  Therapy Documentation Precautions:  Precautions Precaution Comments: recent craioplasty; PEG; trach  Restrictions Weight Bearing Restrictions: No  Pain: No c/o or behaviors demonstrating pain.   Therapy/Group: Individual Therapy  [name] 08/26/2018, 2:21 PM

## 2018-08-27 ENCOUNTER — Inpatient Hospital Stay (HOSPITAL_COMMUNITY): Payer: Medicare HMO

## 2018-08-27 ENCOUNTER — Inpatient Hospital Stay (HOSPITAL_COMMUNITY): Payer: Medicare HMO | Admitting: Physical Therapy

## 2018-08-27 ENCOUNTER — Inpatient Hospital Stay (HOSPITAL_COMMUNITY): Payer: Medicare HMO | Admitting: Speech Pathology

## 2018-08-27 ENCOUNTER — Inpatient Hospital Stay (HOSPITAL_COMMUNITY): Payer: Medicare HMO | Admitting: Occupational Therapy

## 2018-08-27 LAB — GLUCOSE, CAPILLARY
Glucose-Capillary: 112 mg/dL — ABNORMAL HIGH (ref 70–99)
Glucose-Capillary: 120 mg/dL — ABNORMAL HIGH (ref 70–99)
Glucose-Capillary: 121 mg/dL — ABNORMAL HIGH (ref 70–99)
Glucose-Capillary: 144 mg/dL — ABNORMAL HIGH (ref 70–99)

## 2018-08-27 MED ORDER — QUETIAPINE FUMARATE 25 MG PO TABS
12.5000 mg | ORAL_TABLET | Freq: Every evening | ORAL | Status: DC | PRN
  Administered 2018-08-27: 12.5 mg via ORAL
  Filled 2018-08-27: qty 1

## 2018-08-27 NOTE — Plan of Care (Signed)
  Problem: Consults Goal: RH BRAIN INJURY PATIENT EDUCATION Description: Description: See Patient Education module for eduction specifics Outcome: Progressing Goal: Nutrition Consult-if indicated Outcome: Progressing   Problem: RH BOWEL ELIMINATION Goal: RH STG MANAGE BOWEL WITH ASSISTANCE Description: STG Manage Bowel with mod Assistance. Outcome: Progressing Goal: RH STG MANAGE BOWEL W/MEDICATION W/ASSISTANCE Description: STG Manage Bowel with Medication with mod Assistance. Outcome: Progressing   Problem: RH BLADDER ELIMINATION Goal: RH STG MANAGE BLADDER WITH ASSISTANCE Description: STG Manage Bladder With mod Assistance Outcome: Progressing   Problem: RH SAFETY Goal: RH STG ADHERE TO SAFETY PRECAUTIONS W/ASSISTANCE/DEVICE Description: STG Adhere to Safety Precautions With mod Assistance and appropriate assistive Device. Outcome: Progressing   Problem: RH PAIN MANAGEMENT Goal: RH STG PAIN MANAGED AT OR BELOW PT'S PAIN GOAL Description: <3 on a 0-10 pain scale Outcome: Progressing   Problem: RH BOWEL ELIMINATION Goal: RH STG MANAGE BOWEL W/MEDICATION W/ASSISTANCE Description: STG Manage Bowel with Medication with Assistance. Outcome: Progressing   Problem: RH KNOWLEDGE DEFICIT BRAIN INJURY Goal: RH STG INCREASE KNOWLEDGE OF SELF CARE AFTER BRAIN INJURY Description: Patient will demonstrate how to manage medications, dietary restrictions, and follow up care with the MD post discharge with min assist from rehab staff. Outcome: Not Progressing

## 2018-08-27 NOTE — Progress Notes (Signed)
Occupational Therapy Session Note  Patient Details  Name: Mark [name] MRN: [id number] Date of Birth: Jul 14, 1950  Today's Date: 08/27/2018 OT Individual Time: 1300-1400 OT Individual Time Calculation (min): 60 min    Short Term Goals: Week 1:  OT Short Term Goal 1 (Week 1): Pt will fasten B shoes wiht Supervision OT Short Term Goal 2 (Week 1): Pt will locate 3/3 items on R wiht min VC OT Short Term Goal 3 (Week 1): Pt will complete toilet transfer wiht Supervision and LRAD  Skilled Therapeutic Interventions/Progress Updates:    Pt received supine in bed with no c/opain. Pt requesting to shave hair (half head shaved from sx) with electric razor from home. Assisted pt in applying guard and then provided min HOH assistance to ensure accuracy. Pt pleased with haircut and smiling in mirror. Pt pointing at incision on head and asking where it came from. Oriented pt to sx and prolonged hospitalization in simple, direct terms, but pt unable to fully understand/orient. Pt was then transported to therapy gym where he completed standing level functional reaching and naming task with numbers. Pt requiring max cueing to complete novel, non-functional task. Odor of urine became apparent nad pt was returned to room. Pt completed ambulaotyr transfer to toilet with min A. Urinary incontinence was present in brief. Pt voided more urine on toilet despite saying he did not need to use the bathroom. Pt completed clothing management in standing with (S). Pt completed functional reaching and dynamic standing balance task with moderate cueing required. Pt was returned to room and left supine with all needs met.   Therapy Documentation Precautions:  Precautions Precaution Comments: recent craioplasty; PEG; trach  Restrictions Weight Bearing Restrictions: No   Therapy/Group: Individual Therapy  Curtis Sites 08/27/2018, 7:12 AM

## 2018-08-27 NOTE — Progress Notes (Signed)
[place] PHYSICAL MEDICINE & REHABILITATION PROGRESS NOTE   Subjective/Complaints: Pt didn't sleep all that well. Appears comfortable this morning  ROS: limited due to language/communication    Objective:   No results found. Recent Labs    08/26/18 0710  WBC 9.2  HGB 10.4*  HCT 32.5*  PLT 365   Recent Labs    08/26/18 0710  NA 137  K 4.3  CL 103  CO2 25  GLUCOSE 114*  BUN 22  CREATININE 1.09  CALCIUM 9.4    Intake/Output Summary (Last 24 hours) at 08/27/2018 0926 Last data filed at 08/26/2018 2216 Gross per 24 hour  Intake 354 ml  Output 300 ml  Net 54 ml     Physical Exam: Vital Signs Blood pressure (!) 148/75, pulse 63, temperature 98 F (36.7 C), temperature source Oral, resp. rate 17, height 5\' 6"  (1.676 m), weight 54.8 kg, SpO2 97 %. Constitutional: No distress . Vital signs reviewed. HEENT: EOMI, oral membranes moist Neck: supple Cardiovascular: RRR without murmur. No JVD    Respiratory: CTA Bilaterally without wheezes or rales. Normal effort    GI: BS +, non-tender, non-distended  Musculoskeletal:     Comments: full PROM Neurological: He is alert.  Continued Receptive aphasia. Followed more simple commands today. Moves all 4's at least 4 to 4+/5. Seems to sense pain.  Right field cut.  Skin: bruises, lacs on arms Psychiatric: restless, sometimes perseverative     Assessment/Plan: 1. 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  Physiatrist is providing close team supervision and 24 hour management of active medical problems listed below.  Physiatrist and rehab team continue to assess barriers to discharge/monitor patient progress toward functional and medical goals  Care Tool:  Bathing    Body parts bathed by patient: Right arm, Left arm, Chest, Abdomen, Front perineal area, Buttocks, Right upper leg, Left upper leg, Right lower  leg, Left lower leg, Face         Bathing assist Assist Level: Contact Guard/Touching assist     Upper Body Dressing/Undressing Upper body dressing   What is the patient wearing?: Pull over shirt    Upper body assist Assist Level: Contact Guard/Touching assist    Lower Body Dressing/Undressing Lower body dressing      What is the patient wearing?: Pants     Lower body assist Assist for lower body dressing: Minimal Assistance - Patient > 75%     Toileting Toileting    Toileting assist Assist for toileting: Contact Guard/Touching assist     Transfers Chair/bed transfer  Transfers assist  Chair/bed transfer activity did not occur: N/A  Chair/bed transfer assist level: Minimal Assistance - Patient > 75%     Locomotion Ambulation   Ambulation assist      Assist level: Minimal Assistance - Patient > 75% Assistive device: Hand held assist Max distance: 125 ft   Walk 10 feet activity   Assist  Walk 10 feet activity did not occur: Safety/medical concerns(dynanuc balance deficits)  Assist level: Minimal Assistance - Patient > 75% Assistive device: Hand held assist   Walk 50 feet activity   Assist Walk 50 feet with 2 turns activity did not occur: Safety/medical concerns(dynamic balance deficits)  Assist level: Minimal Assistance - Patient > 75% Assistive device: Hand held assist    Walk 150 feet activity   Assist Walk 150 feet activity did not occur: Safety/medical concerns(dynamic balance deficits)  Assist level: Moderate Assistance - Patient - 50 - 74% Assistive device: Hand held assist    Walk 10 feet on uneven surface  activity   Assist Walk 10 feet on uneven surfaces activity did not occur: Safety/medical concerns(dynamic balance deficits)         Wheelchair     Assist Will patient use wheelchair at discharge?: No Type of Wheelchair: Manual    Wheelchair assist level: Moderate Assistance - Patient 50 - 74% Max wheelchair  distance: 150 ft    Wheelchair 50 feet with 2 turns activity    Assist    Wheelchair 50 feet with 2 turns activity did not occur: Safety/medical concerns(unable to motor plan)   Assist Level: Moderate Assistance - Patient 50 - 74%   Wheelchair 150 feet activity     Assist Wheelchair 150 feet activity did not occur: Safety/medical concerns(unable to motor plan)   Assist Level: Moderate Assistance - Patient 50 - 74%    Medical Problem List and Plan: 1.  Deficits with mobility, self-care, language, nutrition, cognition, swallowing secondary to left temporal ICH           continue CIR therapies today including PT and OT and SLP  -team conf today  2.  Antithrombotics: -DVT/anticoagulation:  Mechanical: Sequential compression devices, below knee Bilateral lower extremities              Lower extremity Dopplers negative             -antiplatelet therapy: N/A 3. Pain Management: Oxycodone prn.  4. Mood: LCSW to follow for evaluation and support when appropriate. Continue Zoloft and Klonopin for mood stabilization.              -antipsychotic agents:  add low dose seroquel for hs agitation/insomnia 5. Neuropsych: This patient is not capable of making decisions on his own behalf.    6. Skin/Wound Care: local care to skin  - continue PEG  care.  7. Fluids/Electrolytes/Nutrition:  Remains NPO  -continue glucerna 1.2 TF 70cc/hr at HS until PO is more consistent   -advanced to D2/thin diet    -prealbumin 26.2, BUN now normal 22 8.  HTN: Monitor BP tid- continue amlodipine, hydralazine and lisinopril.    -increased lisinopril to 40mg  daily ---observe for effect 9. T2DM: Diet controlled-Hgb A1c- 5.5 at admission.  Lantus daily and SSI qid prn elevated BS.    -controlled 6/30 10. ABLA: Monitor for signs of bleeding. hgb 10.4 today 11. CKD stage III: Cr 1.09 6/29 12. Pulmonary: pt self-decannulated .   -stoma closed    LOS: 6 days A FACE TO FACE EVALUATION WAS  PERFORMED  Ranelle Oyster 08/27/2018, 9:26 AM

## 2018-08-27 NOTE — Progress Notes (Signed)
Physical Therapy Session Note  Patient Details  Name: Mark [name] MRN: [id number] Date of Birth: August 12, 1950  Today's Date: 08/27/2018 PT Individual Time: 1031-1127 PT Individual Time Calculation (min): 56 min   Short Term Goals: Week 2:  PT Short Term Goal 1 (Week 2): Pt will negotiate 12 steps with B rails & min assist. PT Short Term Goal 2 (Week 2): Pt will negotiate 2 steps without rails with LRAD & mod assist. PT Short Term Goal 3 (Week 2): Pt will ambulate 75 ft with LRAD & supervision.  Skilled Therapeutic Interventions/Progress Updates:  Pt received in w/c & agreeable to tx. RN present administering meds. Pt with improving speech on this date. Pt transfers sit<>stand with min assist and ambulates room<>dayroom and room>ortho gym>BI gym>dayroom>room with RUE HHA min assist with slight improvement in weight shifting L with therapist continuing to manually facilitate weight shifting L. Pt with some improvement in scanning/locating objects on R side today. Pt completed car transfer at sedan simulated height with min assist & negotiates ramp & uneven surface (mulch) with RUE HHA min assist with ongoing cuing not to reach & hold to things with LUE for stability/support (ex: rails, walls). Pt completes bed mobility on mat table with supervision. Pt transitions to quadruped with visual cuing and performs lifting 1 extremity at a time then bird dog exercises with demo, visual & tactile cuing with task focusing on core/trunk strengthening & stability, and R NMR.  While sitting EOM pt engaged in tossing ball with task focusing on BUE coordination, R NMR, and scanning/tracking to R with pt demonstrating decreased tracking to R with R eye. Attempted to have pt assemble most simple peg board design from preselected pieces but pt unable to finish when therapist starts nor follow instructions to complete task. Attempted to have pt utilize dynavision but pt states "not this crap again". Pt performed 4"  step taps with min<>light mod assist & RUE HHA with task focusing on weight shifting L<>R, dynamic balance, and RLE NMR. At end of session pt left in bed with alarm set & call bell in reach, telesitter in room.   Therapy Documentation Precautions:  Precautions Precaution Comments: recent craioplasty; PEG; trach  Restrictions Weight Bearing Restrictions: No    Pain: No c/o or behaviors demonstrating pain during session.     Therapy/Group: Individual Therapy  [name] 08/27/2018, 11:29 AM

## 2018-08-27 NOTE — Progress Notes (Signed)
Speech Language Pathology Daily Session Note  Patient Details  Name: Mark [name] MRN: [id number] Date of Birth: 04/03/1950  Today's Date: 08/27/2018 SLP Individual Time: 0930-1000 SLP Individual Time Calculation (min): 30 min  Short Term Goals: Week 1: SLP Short Term Goal 1 (Week 1): Pt will demonstrate sustained attention to task for 10 minutes with Min A cues. SLP Short Term Goal 2 (Week 1): With SLP positioned on pt's right, pt will scan to task in 4 out of 10 opportunities with Max A cues. SLP Short Term Goal 3 (Week 1): Pt will consume regular diet textures with thin liquids and minimal overt s/s of aspiration. SLP Short Term Goal 3 - Progress (Week 1): Updated due to goal met SLP Short Term Goal 4 (Week 1): Pt will utlizie multimodal communication to  communicate wants and needs with Max A cues. SLP Short Term Goal 4 - Progress (Week 1): Updated due to goal met  Skilled Therapeutic Interventions:  Skilled treatment session focused on dysphagia and communication goals. SLP facilitated session by providing skilled observation of pt consuming trials of graham crackers with contextual language facilitated around task. Pt with increased appropriate spontaneous utterances during context. Additionally, after required Mod A cues/support for self-feeding, pt consumed without any overt s/s of oral or pharyngeal phase dysphagia. Would recommend observation of full tray of regular textures prior to upgrade.      Pain    Therapy/Group: Individual Therapy  Mardee Clune 08/27/2018, 2:54 PM

## 2018-08-27 NOTE — Progress Notes (Signed)
Occupational Therapy Session Note  Patient Details  Name: Mark [name] MRN: [id number] Date of Birth: 03/05/1950  Today's Date: 08/27/2018 OT Individual Time: 9323-5573 OT Individual Time Calculation (min): 45 min    Short Term Goals: Week 1:  OT Short Term Goal 1 (Week 1): Pt will fasten B shoes wiht Supervision OT Short Term Goal 2 (Week 1): Pt will locate 3/3 items on R wiht min VC OT Short Term Goal 3 (Week 1): Pt will complete toilet transfer wiht Supervision and LRAD  Skilled Therapeutic Interventions/Progress Updates:    1:1 Pt asleep when arrived. Self care retraining at shower level- trach site covered. Ambulated with HHA with min A from the bed to the toilet. Pt performed toileting sit to stand 2/2 steps with min guard. Transitioned into the shower and bathed 10/10 parts with min guard with VC for attention to the right. Pt dressed with extra time sit to stand at the w/c. PT did required cue for the use of deodorant. A pt with shaving his beard with max to total A.  Left with nursing to wat breakfast.   Therapy Documentation Precautions:  Precautions Precaution Comments: recent craioplasty; PEG; trach  Restrictions Weight Bearing Restrictions: No Pain:  no c/o pain in session session    Therapy/Group: Individual Therapy  Willeen Cass [facility] 08/27/2018, 2:28 PM

## 2018-08-27 NOTE — Progress Notes (Signed)
Physical Therapy Weekly Progress Note  Patient Details  Name: Mark [name] MRN: [id number] Date of Birth: 11-16-1950  Beginning of progress report period: August 22, 2018 End of progress report period: August 27, 2018  Today's Date: 08/27/2018  Patient has met 1 of 4 short term goals.  Pt is making decent gains towards LTG's. Pt can currently ambulate up to 200 ft with RUE HHA & min assist with decreased weight shifting L. Pt appears to have a R field cut & some R inattention. Pt can currently negotiate 8 steps (3") with B rails with min assist. Pt would benefit from continued skilled PT treatment to focus on R NMR, balance, gait, stair negotiation, endurance, awareness & cognitive remediation.   Patient continues to demonstrate the following deficits muscle weakness, decreased cardiorespiratoy endurance, decreased coordination, decreased visual acuity, decreased visual perceptual skills and field cut, decreased attention to right, decreased attention, decreased awareness, decreased problem solving, decreased safety awareness, decreased memory and delayed processing, and decreased standing balance, decreased postural control, decreased balance strategies and R hemiparesis and therefore will continue to benefit from skilled PT intervention to increase functional independence with mobility.  Patient progressing toward long term goals..  Continue plan of care. Downgraded stairs without rails goal with LRAD to min assist.   PT Short Term Goals Week 1:  PT Short Term Goal 1 (Week 1): pt will transfer consistently bed>< w/c with supervision PT Short Term Goal 1 - Progress (Week 1): Progressing toward goal PT Short Term Goal 2 (Week 1): pt will perform gait with LRAD x 50' including turns PT Short Term Goal 2 - Progress (Week 1): Met PT Short Term Goal 3 (Week 1): pt will negotiate 12 steps 2 rails with close supervision PT Short Term Goal 3 - Progress (Week 1): Progressing toward goal PT Short Term  Goal 4 (Week 1): pt will negotiate 1 curb/step with min assist and LRAD PT Short Term Goal 4 - Progress (Week 1): Progressing toward goal Week 2:  PT Short Term Goal 1 (Week 2): Pt will negotiate 12 steps with B rails & min assist. PT Short Term Goal 2 (Week 2): Pt will negotiate 2 steps without rails with LRAD & mod assist. PT Short Term Goal 3 (Week 2): Pt will ambulate 75 ft with LRAD & supervision.   Therapy Documentation Precautions:  Precautions Precaution Comments: recent craioplasty; PEG; trach  Restrictions Weight Bearing Restrictions: No    Therapy/Group: Individual Therapy  [name] 08/27/2018, 8:29 AM

## 2018-08-28 ENCOUNTER — Inpatient Hospital Stay (HOSPITAL_COMMUNITY): Payer: Medicare HMO

## 2018-08-28 ENCOUNTER — Inpatient Hospital Stay (HOSPITAL_COMMUNITY): Payer: Medicare HMO | Admitting: Occupational Therapy

## 2018-08-28 ENCOUNTER — Inpatient Hospital Stay (HOSPITAL_COMMUNITY): Payer: Medicare HMO | Admitting: Physical Therapy

## 2018-08-28 LAB — GLUCOSE, CAPILLARY
Glucose-Capillary: 118 mg/dL — ABNORMAL HIGH (ref 70–99)
Glucose-Capillary: 102 mg/dL — ABNORMAL HIGH (ref 70–99)
Glucose-Capillary: 118 mg/dL — ABNORMAL HIGH (ref 70–99)
Glucose-Capillary: 100 mg/dL — ABNORMAL HIGH (ref 70–99)

## 2018-08-28 MED ORDER — FREE WATER
50.0000 mL | Freq: Two times a day (BID) | Status: DC
  Administered 2018-08-28 – 2018-09-03 (×14): 50 mL

## 2018-08-28 NOTE — Progress Notes (Signed)
Occupational Therapy Session Note  Patient Details  Name: Mark [name] MRN: [id number] Date of Birth: May 17, 1950  Today's Date: 08/28/2018 OT Individual Time: 1400-1525 OT Individual Time Calculation (min): 85 min    Short Term Goals: Week 1:  OT Short Term Goal 1 (Week 1): Pt will fasten B shoes wiht Supervision OT Short Term Goal 2 (Week 1): Pt will locate 3/3 items on R wiht min VC OT Short Term Goal 3 (Week 1): Pt will complete toilet transfer wiht Supervision and LRAD  Skilled Therapeutic Interventions/Progress Updates:    Pt received supine in bed agreeable to therapy with no c/o pain. Pt able to clearly state "I need to use the bathroom" and completed ambulatory transfer into bathroom with CGA. His incontinence brief was wet with urine and bowel. Pt further voided bowels and was able to complete peri hygiene in sitting with set up assist. He then completed B hand hygiene at sink with CGA for standing balance. Pt completed 175 ft of functional mobility to therapy gym with min A. Frequent cueing required for pt to maintain straight trajectory and to scan environment to the R. Pt completed several different functional reaching task with increasing demand to attend to R visual field. Activities graded by adding in dynamic standing balance component, with which pt required min A. Pt had skin tear on R hand that RN was alerted to and was bandaged. Pt completed dynamic standing balance of floor retrieval, moderate cueing for UE positioning, min A overall provided. Pt completed visual scanning task seated with different color chips. Pt required mod cueing to comprehend task instructions and benefited from a visual cue. Pt required tactile cues to scan to R but by half way through task pt spontaneously attended to R side with no cueing! Pt became frustrated with novel task that he could not comprehend instructions for. With a rest break pt was able to self regulate frustration and return to  baseline. Pt completed day/month sequencing task with word finding component, requiring mod cueing. Pt returned to his room and completed toileting task once more to ensure dryness. Pt left supine with RN present, all needs met.   Therapy Documentation Precautions:  Precautions Precaution Comments: recent craioplasty; PEG; trach  Restrictions Weight Bearing Restrictions: No   Therapy/Group: Individual Therapy  Curtis Sites 08/28/2018, 7:12 AM

## 2018-08-28 NOTE — Plan of Care (Signed)
  Problem: Consults Goal: RH BRAIN INJURY PATIENT EDUCATION Description: Description: See Patient Education module for eduction specifics Outcome: Not Progressing Note: Patient is psychologically unstable.     Problem: RH SAFETY Goal: RH STG ADHERE TO SAFETY PRECAUTIONS W/ASSISTANCE/DEVICE Description: STG Adhere to Safety Precautions With mod Assistance and appropriate assistive Device. Outcome: Progressing   Problem: RH PAIN MANAGEMENT Goal: RH STG PAIN MANAGED AT OR BELOW PT'S PAIN GOAL Description: <3 on a 0-10 pain scale Outcome: Progressing

## 2018-08-28 NOTE — Progress Notes (Signed)
Occupational Therapy Session Note  Patient Details  Name: Leocadio Heal MRN: [id number] Date of Birth: Jun 28, 1950  Today's Date: 08/28/2018 OT Individual Time: 2703-5009 OT Individual Time Calculation (min): 57 min    Short Term Goals: Week 1:  OT Short Term Goal 1 (Week 1): Pt will fasten B shoes wiht Supervision OT Short Term Goal 2 (Week 1): Pt will locate 3/3 items on R wiht min VC OT Short Term Goal 3 (Week 1): Pt will complete toilet transfer wiht Supervision and LRAD  Skilled Therapeutic Interventions/Progress Updates:    Upon entering the room, pt supine in bed with no c/o pain this session. Pt is agreeable to OT intervention. Pt very impulsive this session and needing min - mod cuing for safety awareness this session. Pt ambulating with min hand held assist into bathroom. Pt doffing clothing items and able to void on toilet with min guard for balance. Pt transferred onto shower seat to wash while seated with min cuing for sequencing and initiation for routine task. Pt having 1 LOB posteriorly when standing to dry buttocks requiring mod A. Pt returning to sit on EOB to don clothing items. Pt becoming very frustrated when he was not able to don shirt properly. OT placing over neck and he was able to thread UEs and pull down trunk. Pt needing min A for balance with LB clothing management. Pt able to don and tie shoe laces with supervision. Pt returning to bed at end of session. Bed alarm activated and call bell within reach upon exiting the room.   Therapy Documentation Precautions:  Precautions Precaution Comments: recent craioplasty; PEG; trach  Restrictions Weight Bearing Restrictions: No   Pain: Pain Assessment Pain Scale: Faces Pain Score: 0-No pain Faces Pain Scale: No hurt   Therapy/Group: Individual Therapy  Gypsy Decant 08/28/2018, 12:56 PM

## 2018-08-28 NOTE — Progress Notes (Addendum)
Physical Therapy Session Note  Patient Details  Name: Mark [name] MRN: [id number] Date of Birth: 1950-06-11  Today's Date: 08/28/2018 PT Individual Time: 1004-1059 PT Individual Time Calculation (min): 55 min   Short Term Goals: Week 2:  PT Short Term Goal 1 (Week 2): Pt will negotiate 12 steps with B rails & min assist. PT Short Term Goal 2 (Week 2): Pt will negotiate 2 steps without rails with LRAD & mod assist. PT Short Term Goal 3 (Week 2): Pt will ambulate 75 ft with LRAD & supervision.  Skilled Therapeutic Interventions/Progress Updates:  Pt received in bed with RN present administering meds. Pt transferred to sitting EOB with bed features & supervision. Pt reports need to use restroom and ambulates into bathroom without AD & min assist with pt continuing to grab for sink, walls, etc. Pt with slight LOB prior to toilet transfer and able to correct with min assist. Pt manages clothing without assistance and has continent void on toilet. Pt performs hand hygiene standing at sink with cuing to use soap & locate paper towels on R. Transported pt to dayroom via w/c dependent assist for time management. Pt transfers sit<>stand with min assist and ambulates 200 ft without AD & min assist with decreased weight shifting L & slightly more lateral sway with fatigue. Pt continues to appear as though he has a R field cut. Pt negotiates 24 steps (6" + 3") with B rails and min assist with step over step pattern. Pt then engages in kicking soccer ball, walking forwards/backwards while tossing ball, attempting to have pt skip & jump, and [place] to L to focus on dynamic balance & weight shifting L, and R NMR with pt requiring mod assist overall. Pt then participates in dancing to focus on dynamic balance as well. Pt ambulates back to room with min assist while carrying board with cups stacked on it without spilling any. Pt returned to supine in bed & doffs shoes with extra time. Pt left in bed with alarm  set & needs at hand.  No c/o or behaviors demonstrating pain during session.  Therapy Documentation Precautions:  Precautions Precaution Comments: recent craioplasty; PEG; trach  Restrictions Weight Bearing Restrictions: No    Therapy/Group: Individual Therapy  [name] 08/28/2018, 11:02 AM

## 2018-08-28 NOTE — Patient Care Conference (Signed)
Inpatient RehabilitationTeam Conference and Plan of Care Update Date: 08/27/2018   Time: 2:35 PM    Patient Name: Mark [name]      Medical Record Number: [id number]  Date of Birth: Feb 13, 1951 Sex: Male         Room/Bed: 4W11C/4W11C-01 Payor Info: Payor: [employer] MEDICARE / Plan: [employer] MEDICARE HMO/PPO / Product Type: *No Product type* /    Admitting Diagnosis: 2. TBI Team  Lt. CVA, Nontraumatic ICH, 24-27 days  Admit Date/Time:  08/21/2018  3:26 PM Admission Comments: No comment available   Primary Diagnosis:  <principal problem not specified> Principal Problem: <principal problem not specified>  Patient Active Problem List   Diagnosis Date Noted  . 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 CKD (chronic kidney disease), stage III ([place])   . Acute blood loss anemia   . Diabetes mellitus type 2 in nonobese ([facility])   . Essential hypertension   . Global aphasia   . Acute on chronic respiratory failure with hypoxia ([place])   . Chronic kidney disease, stage III (moderate) ([facility])   . Nontraumatic intracranial hemorrhage, unspecified ([place])   . Altered mental status, unspecified   . S/P AAA repair 02/01/2018    Expected Discharge Date: Expected Discharge Date: 09/05/18  Team Members Present: Physician leading conference: Dr. Alger Simons Social Worker Present: Lennart Pall, LCSW Nurse Present: Dwaine Gale, RN PT Present: Lavone Nian, PT OT Present: Laverle Hobby, OT SLP Present: Stormy Fabian, SLP PPS Coordinator present : Gunnar Fusi, SLP     Current Status/Progress Goal Weekly Team Focus  Medical   left temporal hemorrhage with right hemiparesis and expressive aphasia, peg, dysphagia, trach out  improve communication and functional mobility  nutrition, trach stoma mgt, liberate nutritionally from PEG   Bowel/Bladder   Pt incontinent bowel/bladder. LBM 08/25/2018  manage bowel/bladder mod assist  assess pt toileting needs qshift/ prn   Swallow/Nutrition/  Hydration   dysphagia 2 with thin liquids  Mod A  instrumental study, return to PO intake   ADL's   min A bathing and dressing, frequent LOB, heavy cueing for Rt attention/scanning, min A transfers  Supervision ADLs and transfers  Rt attention, scanning, NMR. Dynamic standing balance, ADL retraining   Mobility   min assist transfers & bed mobility, mod assist gait with HHA, min assist stairs with B rails  supervision overall  bed mobility, transfers, balance, endurance, R NMR, cognitive remediation, endurance, strengthening   Communication   Total to Max A for comprehension  Max A  introduction of Wernicke Aphasia treatment practices   Safety/Cognition/ Behavioral Observations  Max A with basic  Mod A  how to self-feed, sustain attention, scan to right   Pain   no complaints of pain  0-2/10 on 0-10 pain scale  assess pain qshift/ prn, medicate prn   Skin   peg tube LUQ dry & intact, skin tear right hand, craniectomy scar over left frontal, gauze covering trach stoma  skin remain free of infection and breakdown  assess skin q shift/prn    Rehab Goals Patient on target to meet rehab goals: Yes *See Care Plan and progress notes for long and short-term goals.     Barriers to Discharge  Current Status/Progress Possible Resolutions Date Resolved   Physician    Medical stability;Nutrition means        continue cognitive linguistic remediation      Nursing  PT  Decreased caregiver support;Lack of/limited family support                 OT                  SLP                SW                Discharge Planning/Teaching Needs:  Pt to return to his own home with adult (2) children alternating staying with pt and providing 24/7 assistance.  Teaching needs still TBD.   Team Discussion:  Pt self - decanulated and trach will remain out.  Doing well.  incont b/b.  Min assist with b/d.  amb HHA with min assist. freq LOB.  Needs heavy cues. Goals at supervision overall.   SW to follow up with family about education needs.  Revisions to Treatment Plan:  NA    Continued Need for Acute Rehabilitation Level of Care: The patient requires daily medical management by a physician with specialized training in physical medicine and rehabilitation for the following conditions: Daily direction of a multidisciplinary physical rehabilitation program to ensure safe treatment while eliciting the highest outcome that is of practical value to the patient.: Yes Daily medical management of patient stability for increased activity during participation in an intensive rehabilitation regime.: Yes Daily analysis of laboratory values and/or radiology reports with any subsequent need for medication adjustment of medical intervention for : Neurological problems;Nutritional problems;Urological problems   I attest that I was present, lead the team conference, and concur with the assessment and plan of the team.   Amada Jupiter 08/28/2018, 12:59 PM   Team conference was held via web/ teleconference due to COVID - 19

## 2018-08-28 NOTE — Progress Notes (Signed)
[place] PHYSICAL MEDICINE & REHABILITATION PROGRESS NOTE   Subjective/Complaints: Had a fair night. Was sleeping soundly when I arrived  ROS: limited due to language/communication   Objective:   No results found. Recent Labs    08/26/18 0710  WBC 9.2  HGB 10.4*  HCT 32.5*  PLT 365   Recent Labs    08/26/18 0710  NA 137  K 4.3  CL 103  CO2 25  GLUCOSE 114*  BUN 22  CREATININE 1.09  CALCIUM 9.4    Intake/Output Summary (Last 24 hours) at 08/28/2018 0853 Last data filed at 08/28/2018 0806 Gross per 24 hour  Intake 997 ml  Output -  Net 997 ml     Physical Exam: Vital Signs Blood pressure (!) 147/73, pulse 66, temperature 98.5 F (36.9 C), temperature source Oral, resp. rate 12, height 5\' 6"  (1.676 m), weight 55.2 kg, SpO2 96 %. Constitutional: No distress . Vital signs reviewed. HEENT: EOMI, oral membranes moist Neck: supple Cardiovascular: RRR without murmur. No JVD    Respiratory: CTA Bilaterally without wheezes or rales. Normal effort    GI: BS +, non-tender, non-distended  Musculoskeletal:     Comments: full PROM Neurological: He is alert.  Continued Receptive aphasia. More appropriate language at times? Followed more simple commands today. Moves all 4's at least 4 to 4+/5. Seems to sense pain.  Right field cut.  Skin: bruises, lacs on arms Psychiatric: restless, sometimes perseverative     Assessment/Plan: 1. 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  Physiatrist is providing close team supervision and 24 hour management of active medical problems listed below.  Physiatrist and rehab team continue to assess barriers to discharge/monitor patient progress toward functional and medical goals  Care Tool:  Bathing    Body parts bathed by patient: Right arm, Left arm, Chest, Abdomen, Front perineal area, Buttocks, Right upper leg, Left  upper leg, Right lower leg, Left lower leg, Face         Bathing assist Assist Level: Contact Guard/Touching assist     Upper Body Dressing/Undressing Upper body dressing   What is the patient wearing?: Pull over shirt    Upper body assist Assist Level: Contact Guard/Touching assist    Lower Body Dressing/Undressing Lower body dressing      What is the patient wearing?: Pants     Lower body assist Assist for lower body dressing: Minimal Assistance - Patient > 75%     Toileting Toileting    Toileting assist Assist for toileting: Minimal Assistance - Patient > 75%     Transfers Chair/bed transfer  Transfers assist  Chair/bed transfer activity did not occur: N/A  Chair/bed transfer assist level: Minimal Assistance - Patient > 75%     Locomotion Ambulation   Ambulation assist      Assist level: Minimal Assistance - Patient > 75% Assistive device: Hand held assist Max distance: 200 ft   Walk 10 feet activity   Assist  Walk 10 feet activity did not occur: Safety/medical concerns(dynanuc balance deficits)  Assist level: Minimal Assistance - Patient > 75% Assistive device: Hand held assist   Walk 50 feet activity   Assist Walk 50 feet with 2 turns activity did not occur: Safety/medical concerns(dynamic balance deficits)  Assist level: Minimal Assistance - Patient > 75% Assistive device: Hand held assist    Walk 150 feet activity   Assist Walk 150 feet activity did  not occur: Safety/medical concerns(dynamic balance deficits)  Assist level: Minimal Assistance - Patient > 75% Assistive device: Hand held assist    Walk 10 feet on uneven surface  activity   Assist Walk 10 feet on uneven surfaces activity did not occur: Safety/medical concerns(dynamic balance deficits)   Assist level: Minimal Assistance - Patient > 75% Assistive device: Hand held assist   Wheelchair     Assist Will patient use wheelchair at discharge?: No Type of  Wheelchair: Manual    Wheelchair assist level: Moderate Assistance - Patient 50 - 74% Max wheelchair distance: 150 ft    Wheelchair 50 feet with 2 turns activity    Assist    Wheelchair 50 feet with 2 turns activity did not occur: Safety/medical concerns(unable to motor plan)   Assist Level: Moderate Assistance - Patient 50 - 74%   Wheelchair 150 feet activity     Assist Wheelchair 150 feet activity did not occur: Safety/medical concerns(unable to motor plan)   Assist Level: Moderate Assistance - Patient 50 - 74%    Medical Problem List and Plan: 1.  Deficits with mobility, self-care, language, nutrition, cognition, swallowing secondary to left temporal ICH           continue CIR therapies today including PT and OT and SLP    2.  Antithrombotics: -DVT/anticoagulation:  Mechanical: Sequential compression devices, below knee Bilateral lower extremities              Lower extremity Dopplers negative             -antiplatelet therapy: N/A 3. Pain Management: Oxycodone prn.  4. Mood: LCSW to follow for evaluation and support when appropriate. Continue Zoloft and Klonopin for mood stabilization.              -antipsychotic agents:  add low dose seroquel for hs agitation/insomnia 5. Neuropsych: This patient is not capable of making decisions on his own behalf.    6. Skin/Wound Care: local care to skin  - continue PEG  care.  7. Fluids/Electrolytes/Nutrition:  Ate much better yesterday and this morning  -dc HS TF   -continue D2/thin diet    -prealbumin 26.2, BUN now normal 22  -follow up labs Friday 8.  HTN: Monitor BP tid- continue amlodipine, hydralazine and lisinopril.    -increased lisinopril to 40mg  daily ---observe for effect 9. T2DM: Diet controlled-Hgb A1c- 5.5 at admission.  Lantus daily and SSI qid prn elevated BS.    -controlled 7/1 10. ABLA: Monitor for signs of bleeding. hgb 10.4  11. CKD stage III: Cr 1.09   12. Pulmonary: pt self-decannulated .   -stoma  closed    LOS: 7 days A FACE TO FACE EVALUATION WAS PERFORMED  Ranelle Oyster 08/28/2018, 8:53 AM

## 2018-08-29 ENCOUNTER — Inpatient Hospital Stay (HOSPITAL_COMMUNITY): Payer: Medicare HMO | Admitting: Occupational Therapy

## 2018-08-29 ENCOUNTER — Inpatient Hospital Stay (HOSPITAL_COMMUNITY): Payer: Medicare HMO

## 2018-08-29 ENCOUNTER — Inpatient Hospital Stay (HOSPITAL_COMMUNITY): Payer: Medicare HMO | Admitting: Speech Pathology

## 2018-08-29 LAB — GLUCOSE, CAPILLARY
Glucose-Capillary: 110 mg/dL — ABNORMAL HIGH (ref 70–99)
Glucose-Capillary: 101 mg/dL — ABNORMAL HIGH (ref 70–99)
Glucose-Capillary: 106 mg/dL — ABNORMAL HIGH (ref 70–99)

## 2018-08-29 MED ORDER — INSULIN ASPART 100 UNIT/ML ~~LOC~~ SOLN: 0.0000 [IU] | Freq: Every day | SUBCUTANEOUS | Status: DC

## 2018-08-29 MED ORDER — INSULIN ASPART 100 UNIT/ML ~~LOC~~ SOLN: 0.0000 [IU] | Freq: Three times a day (TID) | SUBCUTANEOUS | Status: DC

## 2018-08-29 NOTE — Progress Notes (Signed)
[place] PHYSICAL MEDICINE & REHABILITATION PROGRESS NOTE   Subjective/Complaints: Up with tech. Eating breakfast. No new issues.   ROS: limited due to language/communication    Objective:   No results found. No results for input(s): WBC, HGB, HCT, PLT in the last 72 hours. No results for input(s): NA, K, CL, CO2, GLUCOSE, BUN, CREATININE, CALCIUM in the last 72 hours.  Intake/Output Summary (Last 24 hours) at 08/29/2018 1026 Last data filed at 08/29/2018 0809 Gross per 24 hour  Intake 420 ml  Output -  Net 420 ml     Physical Exam: Vital Signs Blood pressure (!) 166/79, pulse 66, temperature 98.5 F (36.9 C), temperature source Oral, resp. rate 15, height 5\' 6"  (1.676 m), weight 55.2 kg, SpO2 98 %. Constitutional: No distress . Vital signs reviewed. HEENT: EOMI, oral membranes moist Neck: supple Cardiovascular: RRR without murmur. No JVD    Respiratory: CTA Bilaterally without wheezes or rales. Normal effort    GI: BS +, non-tender, non-distended  Musculoskeletal:     Comments: full PROM Neurological: He is alert.  Continued more spontaneous language. Receptive deficits still.  Moves all 4's at least 4 to 4+/5. Seems to sense pain.  Right field cut.  Skin: bruises, lacs on arms Psychiatric: a bit calmer     Assessment/Plan: 1. 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  Physiatrist is providing close team supervision and 24 hour management of active medical problems listed below.  Physiatrist and rehab team continue to assess barriers to discharge/monitor patient progress toward functional and medical goals  Care Tool:  Bathing    Body parts bathed by patient: Right arm, Left arm, Chest, Abdomen, Front perineal area, Buttocks, Right upper leg, Left upper leg, Right lower leg, Left lower leg, Face         Bathing assist Assist Level: Contact  Guard/Touching assist     Upper Body Dressing/Undressing Upper body dressing   What is the patient wearing?: Pull over shirt    Upper body assist Assist Level: Minimal Assistance - Patient > 75%    Lower Body Dressing/Undressing Lower body dressing      What is the patient wearing?: Pants     Lower body assist Assist for lower body dressing: Contact Guard/Touching assist     Toileting Toileting    Toileting assist Assist for toileting: Contact Guard/Touching assist     Transfers Chair/bed transfer  Transfers assist  Chair/bed transfer activity did not occur: N/A  Chair/bed transfer assist level: Contact Guard/Touching assist     Locomotion Ambulation   Ambulation assist      Assist level: Minimal Assistance - Patient > 75% Assistive device: No Device Max distance: 20'   Walk 10 feet activity   Assist  Walk 10 feet activity did not occur: Safety/medical concerns(dynanuc balance deficits)  Assist level: Contact Guard/Touching assist Assistive device: No Device   Walk 50 feet activity   Assist Walk 50 feet with 2 turns activity did not occur: Safety/medical concerns(dynamic balance deficits)  Assist level: Minimal Assistance - Patient > 75% Assistive device: No Device    Walk 150 feet activity   Assist Walk 150 feet activity did not occur: Safety/medical concerns(dynamic balance deficits)  Assist level: Minimal Assistance - Patient > 75% Assistive device: No Device    Walk 10 feet on uneven surface  activity   Assist Walk 10 feet on uneven surfaces activity did not  occur: Safety/medical concerns(dynamic balance deficits)   Assist level: Minimal Assistance - Patient > 75% Assistive device: Hand held assist   Wheelchair     Assist Will patient use wheelchair at discharge?: No Type of Wheelchair: Manual    Wheelchair assist level: Moderate Assistance - Patient 50 - 74% Max wheelchair distance: 150 ft    Wheelchair 50 feet with  2 turns activity    Assist    Wheelchair 50 feet with 2 turns activity did not occur: Safety/medical concerns(unable to motor plan)   Assist Level: Moderate Assistance - Patient 50 - 74%   Wheelchair 150 feet activity     Assist Wheelchair 150 feet activity did not occur: Safety/medical concerns(unable to motor plan)   Assist Level: Moderate Assistance - Patient 50 - 74%    Medical Problem List and Plan: 1.  Deficits with mobility, self-care, language, nutrition, cognition, swallowing secondary to left temporal ICH           continue CIR therapies  including PT and OT and SLP    2.  Antithrombotics: -DVT/anticoagulation:  Mechanical: Sequential compression devices, below knee Bilateral lower extremities              Lower extremity Dopplers negative             -antiplatelet therapy: N/A 3. Pain Management: Oxycodone prn.  4. Mood: LCSW to follow for evaluation and support when appropriate. Continue Zoloft and Klonopin for mood stabilization.              -antipsychotic agents:  add low dose seroquel for hs agitation/insomnia 5. Neuropsych: This patient is not capable of making decisions on his own behalf.    6. Skin/Wound Care: local care to skin  - continue PEG  care.  7. Fluids/Electrolytes/Nutrition:  Eating well  -stopped HS TF   -diet advanced to regular!    -prealbumin 26.2, BUN now normal 22  -follow up labs Friday  -should be able to pull PEG prior to discharge.  8.  HTN: Monitor BP tid- continue amlodipine, hydralazine and lisinopril.    -increased lisinopril to 40mg  daily -borderline control 9. T2DM: Diet controlled-Hgb A1c- 5.5 at admission.  Lantus daily and SSI qid prn elevated BS.    -controlled 7/2 10. ABLA: Monitor for signs of bleeding. hgb 10.4  11. CKD stage III: Cr 1.09   12. Pulmonary: pt self-decannulated .   -stoma closed, no issues.     LOS: 8 days A FACE TO FACE EVALUATION WAS PERFORMED  Meredith Staggers 08/29/2018, 10:26 AM

## 2018-08-29 NOTE — Progress Notes (Signed)
Occupational Therapy Session Note  Patient Details  Name: Mark [name] MRN: [id number] Date of Birth: 1950-11-12  Today's Date: 08/29/2018 OT Individual Time: 7741-2878 OT Individual Time Calculation (min): 56 min    Short Term Goals: Week 1:  OT Short Term Goal 1 (Week 1): Pt will fasten B shoes wiht Supervision OT Short Term Goal 2 (Week 1): Pt will locate 3/3 items on R wiht min VC OT Short Term Goal 3 (Week 1): Pt will complete toilet transfer wiht Supervision and LRAD  Skilled Therapeutic Interventions/Progress Updates:    1;1. Pt received in bed with no c/o pain. Pt declines bathing and dressing. Pt stands at sink to brush teeth with CGA. Pt completes declines toileting at beginning or end of session despite encouragement. Pt ambulates throughout session with CGA-MIN A for balnace while walking with HHA. Focus of session on ambulation with R attention and visual scanning using lighthouse method to look for colored discs in dayroom and picture frames on walls. Pt did better knowing # of objects looking for (I.e. 10 discs and 5 picture frames) to understand tasks. Seated at high ow table pt sorts tiles into piles of same shapes with all options presents R of midline with mod VC for locating far R piles. Pt states, "this is bull sh**." OT provides education on R attention impacting functional mobility and safety. Exited session with pt seated in bed, exit alarm on and call light in reach  Therapy Documentation Precautions:  Precautions Precaution Comments: recent craioplasty; PEG; trach  Restrictions Weight Bearing Restrictions: No General:   Vital Signs: Therapy Vitals Pulse Rate: 66 BP: (!) 166/79 Patient Position (if appropriate): Lying Oxygen Therapy SpO2: 98 % O2 Device: Room Air Pain:   ADL:   Vision   Perception    Praxis   Exercises:   Other Treatments:     Therapy/Group: Individual Therapy  Tonny Branch 08/29/2018, 9:55 AM

## 2018-08-29 NOTE — Progress Notes (Signed)
Speech Language Pathology Weekly Progress and Session Note  Patient Details  Name: Mark [name] MRN: [id number] Date of Birth: 01-Jan-1951  Beginning of progress report period: August 22, 2018 End of progress report period: August 29, 2018  Today's Date: 08/29/2018 SLP Individual Time: 8916-9450 SLP Individual Time Calculation (min): 45 min  Short Term Goals: Week 1: SLP Short Term Goal 1 (Week 1): Pt will demonstrate sustained attention to task for 10 minutes with Min A cues. SLP Short Term Goal 1 - Progress (Week 1): Met SLP Short Term Goal 2 (Week 1): With SLP positioned on pt's right, pt will scan to task in 4 out of 10 opportunities with Max A cues. SLP Short Term Goal 2 - Progress (Week 1): Met SLP Short Term Goal 3 (Week 1): Pt will consume regular diet textures with thin liquids and minimal overt s/s of aspiration. SLP Short Term Goal 3 - Progress (Week 1): Progressing toward goal SLP Short Term Goal 4 (Week 1): Pt will utlizie multimodal communication to  communicate wants and needs with Max A cues. SLP Short Term Goal 4 - Progress (Week 1): Progressing toward goal    New Short Term Goals: Week 2: SLP Short Term Goal 1 (Week 2): Pt will demonstrate selecitve attention to task for 30 minutes with Min A cues. SLP Short Term Goal 2 (Week 2): With SLP positioned on pt's right, pt will scan to task in 8 out of 10 opportunities with Max A cues. SLP Short Term Goal 3 (Week 2): Pt will consume regular diet textures with thin liquids and minimal overt s/s of aspiration. SLP Short Term Goal 4 (Week 2): Pt will utlizie multimodal communication to  communicate wants and needs with Mod A cues. SLP Short Term Goal 5 (Week 2): Pt will complete basic familiar task with Mod A cues.  Weekly Progress Updates:  Pt's weekly goals were adjusted earlier in the week and as such he has not met all his STGs. He has progressed from NPO to full return to PO intake and has increased in his abllity to self  feed and complete some ADLs with cues. Pt's continues to exhibit s/s of Wernicke's aphasia but is responding well to contextually based approach. Skilled ST continues to be indicated to target the above deficits, increase functional independence.      Intensity: Minumum of 1-2 x/day, 30 to 90 minutes Frequency: 3 to 5 out of 7 days Duration/Length of Stay: 10 to 12 days Treatment/Interventions: Functional tasks;Patient/family education;Dysphagia/aspiration precaution training;Speech/Language facilitation   Daily Session  Skilled Therapeutic Interventions: Skilled treatment session focused on dysphagia, cognition and communication goals. SLP provided skilled observation of pt consuming regular diet with thin liiquids via straw. SLP requested regular diet in effort to have more familiar appearing items on tray to aid with self-feeding. Pt required Mod A cues to compensate for visual deficits and to problem solve use of utensil. Pt with increased adaptability in using utensils. Pt with effective mastication of solids and no overt s/s of aspiration with thin liquids. Pt required Max A to Mod A cues to scan to right. Additionally, pt unable to name items on his plate but produced spontaneous context based utterances. Pt handed off to nursing with plan to upgrade pt's diet.     General    Pain    Therapy/Group: Individual Therapy  Samar Dass 08/29/2018, 2:58 PM

## 2018-08-29 NOTE — Progress Notes (Signed)
Occupational Therapy Session Note  Patient Details  Name: Mark [name] MRN: [id number] Date of Birth: June 20, 1950  Today's Date: 08/29/2018 OT Individual Time: 0730-0800 OT Individual Time Calculation (min): 30 min    Short Term Goals: Week 1:  OT Short Term Goal 1 (Week 1): Pt will fasten B shoes wiht Supervision OT Short Term Goal 2 (Week 1): Pt will locate 3/3 items on R wiht min VC OT Short Term Goal 3 (Week 1): Pt will complete toilet transfer wiht Supervision and LRAD  Skilled Therapeutic Interventions/Progress Updates:    Pt seen for OT session focusing on functional mobility and ADL re-training. Pt asleep in supine upon arrival, easily awoken and agreeable to tx session. He denied pain and agreeable to tx session.  He transferred to sitting EOB with multi-modal cuing for directions due to langauge deficits. He ambulated into bathroom with min A, not able to follow cuing for HHA initially, and reached out for furniture for balance while ambulating.  He completed toileting task with steadying assist. He sat to void urine and saturated brief changed. He ambulated out of bathroom with HHA and completed grooming tasks standing at sink with steadying assist and min VCs to locate needed items on R and for functional problem solving.  He returned to sitting EOB to eat breakfast with supervision. Set-up assist provided and min VCs throughout for small bite size per SLP instructions. Pt requiring mod A to name food items on tray. Pt required plate to be rotated throughout meal in order to attend to all items, only eating from to lower half of the plate, unable to attend to top portion of plate. Pt returned to supine at end of session, bed alarm set and all needs in reach.   Therapy Documentation Precautions:  Precautions Precaution Comments: recent craioplasty; PEG; trach  Restrictions Weight Bearing Restrictions: No   Therapy/Group: Individual Therapy  Eliyah Mcshea L 08/29/2018, 7:05  AM

## 2018-08-29 NOTE — Progress Notes (Signed)
Physical Therapy Session Note  Patient Details  Name: Mark [name] MRN: [id number] Date of Birth: 12-22-1950  Today's Date: 08/29/2018 PT Individual Time: 1115-1155 PT Individual Time Calculation (min): 40 min   Short Term Goals: Week 2:  PT Short Term Goal 1 (Week 2): Pt will negotiate 12 steps with B rails & min assist. PT Short Term Goal 2 (Week 2): Pt will negotiate 2 steps without rails with LRAD & mod assist. PT Short Term Goal 3 (Week 2): Pt will ambulate 75 ft with LRAD & supervision.  Skilled Therapeutic Interventions/Progress Updates:   Pt able to come to EOB with supervision and don shoes seated EOB with extra time to prepare for therapy session. Functional gait in room in and out of bathroom with overall min assist due to decreased R attention and overall balance. Pt performed hygiene and clothing management with steadying assist with 1 UE on grab bar. Functional gait on unit > 200' with overall CGA to min assist when obstacles on R encountered or during turning requires assist to regain balance. NMR during stair negotiation for reciprocal movement pattern retraining with CGA overall. Floor transfer with min assist ot perform NMR on mat in quadruped and tall kneeling. Pt performed opposite arm/leg raises x 10 reps each side with verbal and tactile cues to maintain balance and midline. In tall kneeling performed functional reaching tasks and coordination activity with RUE and then stablizing through RUE with LUE to perform same tasks. Transitions on floor with overall min assist. Rebounder for dynamic standing balance x 10 reps each at varying distances with min assist and min assist to get ball from floor. Dynamic gait through obstacle course to address RLE coordination and stepping over target with overall min assist. Returned to bed at end of session with all needs in reach and bed alarm on.   Therapy Documentation Precautions:  Precautions Precaution Comments: recent  craioplasty; PEG; trach  Restrictions Weight Bearing Restrictions: No  Pain: Does not appear to have pain and denies.     Therapy/Group: Individual Therapy  Canary Brim Ivory Broad, PT, DPT, CBIS  08/29/2018, 12:56 PM

## 2018-08-29 NOTE — Progress Notes (Signed)
Social Work Patient ID: Mark [name], male   DOB: 04/07/1950, 68 y.o.   MRN: [id number]   Have reviewed team conference with pt's daughter, Thomes Dinning, who is aware of targeted d/c date of 7/9 at supervision level.  Have scheduled for both son and daughter to be here on Tuesday morning to complete family education.  Daughter is pursuing private duty caregiver to cover when family not available.  Pt still with significant speech deficits.   Salil Raineri, LCSW

## 2018-08-30 ENCOUNTER — Inpatient Hospital Stay (HOSPITAL_COMMUNITY): Payer: Medicare HMO | Admitting: Speech Pathology

## 2018-08-30 ENCOUNTER — Inpatient Hospital Stay (HOSPITAL_COMMUNITY): Payer: Medicare HMO

## 2018-08-30 LAB — BASIC METABOLIC PANEL
Anion gap: 8 (ref 5–15)
BUN: 29 mg/dL — ABNORMAL HIGH (ref 8–23)
CO2: 25 mmol/L (ref 22–32)
Calcium: 9.6 mg/dL (ref 8.9–10.3)
Chloride: 105 mmol/L (ref 98–111)
Creatinine, Ser: 1.22 mg/dL (ref 0.61–1.24)
GFR calc Af Amer: >60 mL/min (ref >60)
GFR calc non Af Amer: 60 mL/min — ABNORMAL LOW (ref >60)
Glucose, Bld: 119 mg/dL — ABNORMAL HIGH (ref 70–99)
Potassium: 4.3 mmol/L (ref 3.5–5.1)
Sodium: 138 mmol/L (ref 135–145)

## 2018-08-30 LAB — CBC
HCT: 35.5 % — ABNORMAL LOW (ref 39.0–52.0)
Hemoglobin: 11.2 g/dL — ABNORMAL LOW (ref 13.0–17.0)
MCH: 28.3 pg (ref 26.0–34.0)
MCHC: 31.5 g/dL (ref 30.0–36.0)
MCV: 89.6 fL (ref 80.0–100.0)
Platelets: 399 10*3/uL (ref 150–400)
RBC: 3.96 MIL/uL — ABNORMAL LOW (ref 4.22–5.81)
RDW: 17.7 % — ABNORMAL HIGH (ref 11.5–15.5)
WBC: 8 10*3/uL (ref 4.0–10.5)
nRBC: 0 % (ref 0.0–0.2)

## 2018-08-30 LAB — GLUCOSE, CAPILLARY: Glucose-Capillary: 166 mg/dL — ABNORMAL HIGH (ref 70–99)

## 2018-08-30 MED ORDER — ALUM & MAG HYDROXIDE-SIMETH 200-200-20 MG/5ML PO SUSP: 30.0000 mL | ORAL | Status: DC | PRN

## 2018-08-30 MED ORDER — DIPHENHYDRAMINE HCL 12.5 MG/5ML PO ELIX: 12.5000 mg | ORAL_SOLUTION | Freq: Four times a day (QID) | ORAL | Status: DC | PRN

## 2018-08-30 MED ORDER — POLYETHYLENE GLYCOL 3350 17 G PO PACK: 17.0000 g | PACK | Freq: Every day | ORAL | Status: DC | PRN

## 2018-08-30 MED ORDER — VITAMIN B-1 100 MG PO TABS
100.0000 mg | ORAL_TABLET | Freq: Every day | ORAL | Status: DC
  Administered 2018-08-31 – 2018-09-05 (×6): 100 mg via ORAL
  Filled 2018-08-30 (×6): qty 1

## 2018-08-30 MED ORDER — ADULT MULTIVITAMIN LIQUID CH
15.0000 mL | Freq: Every day | ORAL | Status: DC
  Administered 2018-08-31 – 2018-09-02 (×3): 15 mL via ORAL
  Filled 2018-08-30 (×3): qty 15

## 2018-08-30 MED ORDER — OXYCODONE HCL 5 MG PO TABS: 5.0000 mg | ORAL_TABLET | ORAL | Status: DC | PRN

## 2018-08-30 MED ORDER — HYDRALAZINE HCL 50 MG PO TABS
100.0000 mg | ORAL_TABLET | Freq: Three times a day (TID) | ORAL | Status: DC
  Administered 2018-08-30 – 2018-09-05 (×17): 100 mg via ORAL
  Filled 2018-08-30 (×17): qty 2

## 2018-08-30 MED ORDER — CLONAZEPAM 0.5 MG PO TABS
0.2500 mg | ORAL_TABLET | Freq: Two times a day (BID) | ORAL | Status: DC
  Administered 2018-08-30 – 2018-09-05 (×12): 0.25 mg via ORAL
  Filled 2018-08-30 (×12): qty 1

## 2018-08-30 MED ORDER — LISINOPRIL 40 MG PO TABS
40.0000 mg | ORAL_TABLET | Freq: Every day | ORAL | Status: DC
  Administered 2018-08-31 – 2018-09-05 (×6): 40 mg via ORAL
  Filled 2018-08-30 (×6): qty 1

## 2018-08-30 MED ORDER — GUAIFENESIN-DM 100-10 MG/5ML PO SYRP: 5.0000 mL | ORAL_SOLUTION | Freq: Four times a day (QID) | ORAL | Status: DC | PRN

## 2018-08-30 MED ORDER — BACID PO TABS
2.0000 | ORAL_TABLET | Freq: Three times a day (TID) | ORAL | Status: DC
  Administered 2018-08-30 – 2018-09-02 (×10): 2 via ORAL
  Filled 2018-08-30 (×11): qty 2

## 2018-08-30 MED ORDER — ACETAMINOPHEN 325 MG PO TABS: 325.0000 mg | ORAL_TABLET | ORAL | Status: DC | PRN

## 2018-08-30 MED ORDER — POLYETHYLENE GLYCOL 3350 17 G PO PACK
17.0000 g | PACK | Freq: Every day | ORAL | Status: DC
  Administered 2018-08-31 – 2018-09-05 (×6): 17 g via ORAL
  Filled 2018-08-30 (×6): qty 1

## 2018-08-30 MED ORDER — SERTRALINE HCL 50 MG PO TABS
50.0000 mg | ORAL_TABLET | Freq: Every day | ORAL | Status: DC
  Administered 2018-08-31 – 2018-09-05 (×6): 50 mg via ORAL
  Filled 2018-08-30 (×6): qty 1

## 2018-08-30 MED ORDER — PROCHLORPERAZINE 25 MG RE SUPP: 12.5000 mg | Freq: Four times a day (QID) | RECTAL | Status: DC | PRN

## 2018-08-30 MED ORDER — FAMOTIDINE 40 MG/5ML PO SUSR
20.0000 mg | Freq: Two times a day (BID) | ORAL | Status: DC
  Administered 2018-08-30 – 2018-09-02 (×6): 20 mg via ORAL
  Filled 2018-08-30 (×7): qty 2.5

## 2018-08-30 MED ORDER — AMANTADINE HCL 50 MG/5ML PO SYRP
100.0000 mg | ORAL_SOLUTION | Freq: Every day | ORAL | Status: DC
  Administered 2018-08-31 – 2018-09-05 (×6): 100 mg via ORAL
  Filled 2018-08-30 (×6): qty 10

## 2018-08-30 MED ORDER — PROCHLORPERAZINE MALEATE 5 MG PO TABS: 5.0000 mg | ORAL_TABLET | Freq: Four times a day (QID) | ORAL | Status: DC | PRN

## 2018-08-30 MED ORDER — LEVETIRACETAM 500 MG PO TABS
500.0000 mg | ORAL_TABLET | Freq: Two times a day (BID) | ORAL | Status: DC
  Administered 2018-08-30 – 2018-09-05 (×12): 500 mg via ORAL
  Filled 2018-08-30 (×12): qty 1

## 2018-08-30 MED ORDER — AMLODIPINE BESYLATE 10 MG PO TABS
10.0000 mg | ORAL_TABLET | Freq: Every day | ORAL | Status: DC
  Administered 2018-08-31 – 2018-09-05 (×6): 10 mg via ORAL
  Filled 2018-08-30 (×6): qty 1

## 2018-08-30 MED ORDER — PROCHLORPERAZINE EDISYLATE 10 MG/2ML IJ SOLN: 5.0000 mg | Freq: Four times a day (QID) | INTRAMUSCULAR | Status: DC | PRN

## 2018-08-30 NOTE — Progress Notes (Signed)
Occupational Therapy Weekly Progress Note  Patient Details  Name: Mark [name] MRN: [id number] Date of Birth: 02-19-1951  Beginning of progress report period: August 22, 2018 End of progress report period: August 30, 2018  Today's Date: 08/30/2018 OT Individual Time: 8416-6063 OT Individual Time Calculation (min): 72 min    Patient has met 2 of 3 short term goals.  Pt has made steady progress in OT this week improving R inattention, midline orientation, expressing basic needs, and R FMC. Pt is able to bathe and dressing with CGA-S for balance and min VC for R attention. Pt continues to require CGA HHA for ambulation d/t decreased visual scanning to the R and balance impairments.   Patient continues to demonstrate the following deficits: muscle weakness, decreased cardiorespiratoy endurance, decreased attention to right and decreased motor planning, decreased initiation, decreased attention, decreased awareness, decreased problem solving, decreased safety awareness, decreased memory and delayed processing and decreased sitting balance, decreased standing balance, decreased postural control and decreased balance strategies and therefore will continue to benefit from skilled OT intervention to enhance overall performance with BADL.  Patient progressing toward long term goals..  Continue plan of care.  OT Short Term Goals Week 1:  OT Short Term Goal 1 (Week 1): Pt will fasten B shoes wiht Supervision OT Short Term Goal 1 - Progress (Week 1): Met OT Short Term Goal 2 (Week 1): Pt will locate 3/3 items on R wiht min VC OT Short Term Goal 2 - Progress (Week 1): Met OT Short Term Goal 3 (Week 1): Pt will complete toilet transfer wiht Supervision and LRAD OT Short Term Goal 3 - Progress (Week 1): Progressing toward goal Week 2:  OT Short Term Goal 1 (Week 2): STG=LTG d/t ELOS  Skilled Therapeutic Interventions/Progress Updates:    1;1. Pt received in bed with no report of pain and with  encouragement agreeable to bathing and dressing. Pt ambulates throughout session with min-min guard HHA with VC for scanning to R, however pt frequently bumping into objects in R visual field d/t inattention. Pt with little awareness of inattention, but frequently frustrated by bumping into things. Pt bathes seated on TTB in shower with VC for washing RLE. Pt dresses with S-CGA for LB clothing and set up for UB clothing. Pt able to locate all clothing items and grooming items on R this date. Pt completes box and blocks assessment RUE:18 LUE:28. Pt completes [facility] activity of palm<finger translation of coins off of table with RUE for NMR dropping coins ~25% of time. Pt completes palm>finger translation of coins into vending machine for R visual scanning to locate slot, depth perception and [place] with dropping coins ~50% of time and 1 LOB with MOD A to recover. Pt stands to complete graded peg board activity with supervision for balanace and only 1 VC for locating pegs to the R of peg board. Pt able to follow picture to place correct pegs on board when only needed pegs supplied. Pt frustrated by small tasks, and very insistent at end of session he is going home today. Educated pt on target d/c date of 7/9, however pt adamant that, "isnt going to work for me." Exited session with pt seated in bed, exit alarm on and call light in reach.  Therapy Documentation Precautions:  Precautions Precaution Comments: recent craioplasty; PEG; trach  Restrictions Weight Bearing Restrictions: No General:   Vital Signs: Therapy Vitals Temp: 98.3 F (36.8 C) Temp Source: Oral Pulse Rate: 66 Resp: 18 BP: (!) 156/76  Patient Position (if appropriate): Lying Oxygen Therapy SpO2: 98 % O2 Device: Room Air Pain:   ADL:   Vision   Perception    Praxis   Exercises:   Other Treatments:     Therapy/Group: Individual Therapy  Mark [name] 08/30/2018, 6:39 AM

## 2018-08-30 NOTE — Progress Notes (Signed)
Occupational Therapy Session Note  Patient Details  Name: Mark [name] MRN: [id number] Date of Birth: 03/02/1950  Today's Date: 08/30/2018 OT Individual Time: 1025-8527 OT Individual Time Calculation (min): 35 min    Short Term Goals: Week 1:  OT Short Term Goal 1 (Week 1): Pt will fasten B shoes wiht Supervision OT Short Term Goal 1 - Progress (Week 1): Met OT Short Term Goal 2 (Week 1): Pt will locate 3/3 items on R wiht min VC OT Short Term Goal 2 - Progress (Week 1): Met OT Short Term Goal 3 (Week 1): Pt will complete toilet transfer wiht Supervision and LRAD OT Short Term Goal 3 - Progress (Week 1): Progressing toward goal  Skilled Therapeutic Interventions/Progress Updates:    1;1. Pt received  In bed reporting need to toilet. Pt ambulates to bathroom with CGA and completes toileting iwht CGA for CM. Pt ambulates with min HHA to tx gym to stand on compliant wedge while obtaining wash clothes/towel/pillow cases to fold in far R visual field and placing into laundry basket on L. Pt sit to match cards on vertical surface with min VC for noticing 3 errors. Pt able to carry laundry basket back to room with improved balance walking with object in hands and place towels on towel rack. Pt with LOB when turning to sit on bed requiring MAX A for corrections as pt reaches out to stabilize himself with table on wheels which rolled away. Exited session with pt seated in w/c, call light in reach and all need smet  Therapy Documentation Precautions:  Precautions Precaution Comments: recent craioplasty; PEG; trach  Restrictions Weight Bearing Restrictions: No General:   Vital Signs: Therapy Vitals Temp: 98.6 F (37 C) Temp Source: Oral Pulse Rate: 72 Resp: 16 BP: 125/60 Patient Position (if appropriate): Lying Oxygen Therapy SpO2: 99 % O2 Device: Room Air Pain:   ADL:   Vision   Perception    Praxis   Exercises:   Other Treatments:     Therapy/Group: Individual  Therapy  Tonny Branch 08/30/2018, 4:08 PM

## 2018-08-30 NOTE — Progress Notes (Addendum)
Physical Therapy Session Note  Patient Details  Name: Mark [name] MRN: [id number] Date of Birth: Mar 17, 1950  Today's Date: 08/30/2018 PT Individual Time: 0800-0845 PT Individual Time Calculation (min): 45 min   Short Term Goals: Week 2:  PT Short Term Goal 1 (Week 2): Pt will negotiate 12 steps with B rails & min assist. PT Short Term Goal 2 (Week 2): Pt will negotiate 2 steps without rails with LRAD & mod assist. PT Short Term Goal 3 (Week 2): Pt will ambulate 75 ft with LRAD & supervision.  Skilled Therapeutic Interventions/Progress Updates:     Patient in w/c upon PT arrival. Patient alert and agreeable to PT session. Reported that he needed to go home to take care of paperwork on his house. Patient was oriented to [facility], however when asked what building he was in, he said "my house." PT reoriented patient to time, place, and situation. Patient stated he likes to be called "[name]."  Therapeutic Activity: Transfers: Patient performed sit to/from stand x12 with min A-CGA with and without 1 HHA.  Gait Training:  Patient ambulated 150 feet x2 using HHA x1 and no AD x1 with min A-CGA for balance and avoidance of obstacles on the R. Ambulated with narrow BOS, variable foot placement, decreased gait speed, decreased step length, and veering to the R and/or bumping into obstacles on the R. Provided verbal cues for looking ahead and to the R, walking straight ahead, increased arm swing without AD, and increase gait speed.  Neuromuscular Re-ed: Patient performed step taps on large cones x2 min with R foot completing 3/4 of the tap initially, improved to R foot to the top of the cone with verbal cues. He ambulated between 4 cones down and back with CGA-min A x2 trials  He performed standing balance and heel cord stretch on red foam wedge x2 min, stated "this is pointless," after. Performed standing balance in // bars on Bosu ball x4 min with B UE support and no UE support x4 for 1-5  seconds each trial.  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  Patient in w/c at end of session with breaks locked, seat belt alarm set, and all needs within reach.    Therapy Documentation Precautions:  Precautions Precaution Comments: recent craioplasty; PEG; trach  Restrictions Weight Bearing Restrictions: No Pain: Patient denied pain throughout session.    Therapy/Group: Individual Therapy  Shavon Zenz L Jamas Jaquay PT, DPT  08/30/2018, 3:10 PM

## 2018-08-30 NOTE — Progress Notes (Signed)
[place] PHYSICAL MEDICINE & REHABILITATION PROGRESS NOTE   Subjective/Complaints: No new issues. Eating breakfast. Sleep was a little inconsistent last night  ROS: limited due to language/communication    Objective:   No results found. Recent Labs    08/30/18 0512  WBC 8.0  HGB 11.2*  HCT 35.5*  PLT 399   Recent Labs    08/30/18 0512  NA 138  K 4.3  CL 105  CO2 25  GLUCOSE 119*  BUN 29*  CREATININE 1.22  CALCIUM 9.6    Intake/Output Summary (Last 24 hours) at 08/30/2018 1047 Last data filed at 08/30/2018 0945 Gross per 24 hour  Intake 860 ml  Output -  Net 860 ml     Physical Exam: Vital Signs Blood pressure (!) 156/76, pulse 66, temperature 98.3 F (36.8 C), temperature source Oral, resp. rate 18, height 5\' 6"  (1.676 m), weight 55.2 kg, SpO2 98 %. Constitutional: No distress . Vital signs reviewed. HEENT: EOMI, oral membranes moist Neck: supple Cardiovascular: RRR without murmur. No JVD    Respiratory: CTA Bilaterally without wheezes or rales. Normal effort    GI: BS +, non-tender, non-distended. PEG site clean/dry Musculoskeletal:     Comments: full PROM Neurological: He is alert.  Continues to demonstrate more spontaneous language. Receptive deficits still.  Moves all 4's at least 4 to 4+/5. Seems to sense pain.  Right field cut.  Skin: bruises, lacs on arms Psychiatric: pleasant, redirectable     Assessment/Plan: 1. 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  Physiatrist is providing close team supervision and 24 hour management of active medical problems listed below.  Physiatrist and rehab team continue to assess barriers to discharge/monitor patient progress toward functional and medical goals  Care Tool:  Bathing    Body parts bathed by patient: Right arm, Left arm, Chest, Abdomen, Front perineal area, Buttocks, Right upper leg,  Left upper leg, Right lower leg, Left lower leg, Face         Bathing assist Assist Level: Contact Guard/Touching assist     Upper Body Dressing/Undressing Upper body dressing   What is the patient wearing?: Pull over shirt    Upper body assist Assist Level: Minimal Assistance - Patient > 75%    Lower Body Dressing/Undressing Lower body dressing      What is the patient wearing?: Pants     Lower body assist Assist for lower body dressing: Contact Guard/Touching assist     Toileting Toileting    Toileting assist Assist for toileting: Contact Guard/Touching assist     Transfers Chair/bed transfer  Transfers assist  Chair/bed transfer activity did not occur: N/A  Chair/bed transfer assist level: Contact Guard/Touching assist     Locomotion Ambulation   Ambulation assist      Assist level: Minimal Assistance - Patient > 75% Assistive device: No Device Max distance: 200'   Walk 10 feet activity   Assist  Walk 10 feet activity did not occur: Safety/medical concerns(dynanuc balance deficits)  Assist level: Contact Guard/Touching assist Assistive device: No Device   Walk 50 feet activity   Assist Walk 50 feet with 2 turns activity did not occur: Safety/medical concerns(dynamic balance deficits)  Assist level: Minimal Assistance - Patient > 75% Assistive device: No Device    Walk 150 feet activity   Assist Walk 150 feet activity did not occur: Safety/medical concerns(dynamic balance deficits)  Assist level: Minimal Assistance -  Patient > 75% Assistive device: No Device    Walk 10 feet on uneven surface  activity   Assist Walk 10 feet on uneven surfaces activity did not occur: Safety/medical concerns(dynamic balance deficits)   Assist level: Minimal Assistance - Patient > 75% Assistive device: Hand held assist   Wheelchair     Assist Will patient use wheelchair at discharge?: No Type of Wheelchair: Manual    Wheelchair assist  level: Moderate Assistance - Patient 50 - 74% Max wheelchair distance: 150 ft    Wheelchair 50 feet with 2 turns activity    Assist    Wheelchair 50 feet with 2 turns activity did not occur: Safety/medical concerns(unable to motor plan)   Assist Level: Moderate Assistance - Patient 50 - 74%   Wheelchair 150 feet activity     Assist Wheelchair 150 feet activity did not occur: Safety/medical concerns(unable to motor plan)   Assist Level: Moderate Assistance - Patient 50 - 74%    Medical Problem List and Plan: 1.  Deficits with mobility, self-care, language, nutrition, cognition, swallowing secondary to left temporal ICH           continue CIR therapies  including PT and OT and SLP    2.  Antithrombotics: -DVT/anticoagulation:  Mechanical: Sequential compression devices, below knee Bilateral lower extremities              Lower extremity Dopplers negative             -antiplatelet therapy: N/A 3. Pain Management: Oxycodone prn.  4. Mood: LCSW to follow for evaluation and support when appropriate. Continue Zoloft and Klonopin for mood stabilization.              -antipsychotic agents:  add low dose seroquel for hs agitation/insomnia 5. Neuropsych: This patient is not capable of making decisions on his own behalf.    6. Skin/Wound Care: local care to skin  - continue PEG  care.  7. Fluids/Electrolytes/Nutrition:  Eating well  -stopped HS TF   -diet advanced to regular!    -prealbumin 26.2, BUN up to 29 7/3  -PEG was placed on 5/21. If he continues to eat well and take in enough fluids, this can be removed next week  -recheck BMET Monday, push fluids in meantime 8.  HTN: Monitor BP tid- continue amlodipine, hydralazine and lisinopril.    -increased lisinopril to 40mg  daily.   -bp's borderline  -May need another agent  -continue to monitor fornow 9. T2DM: Diet controlled-Hgb A1c- 5.5 at admission.    -lantus insulin and SSI d/c'ed  -check CBG in AM only  -increase  monitoring if indicated  -reasonably controlled 7/3 10. ABLA: Monitor for signs of bleeding. hgb 10.4  11. CKD stage III: Cr 1.09   12. Pulmonary: pt self-decannulated .   -stoma closed, no issues.     LOS: 9 days A FACE TO FACE EVALUATION WAS PERFORMED  Meredith Staggers 08/30/2018, 10:47 AM

## 2018-08-30 NOTE — Progress Notes (Signed)
Speech Language Pathology Daily Session Note  Patient Details  Name: Edger Husain MRN: [id number] Date of Birth: 08-17-1950  Today's Date: 08/30/2018 SLP Individual Time: 3419-3790 SLP Individual Time Calculation (min): 45 min  Short Term Goals: Week 2: SLP Short Term Goal 1 (Week 2): Pt will demonstrate selecitve attention to task for 30 minutes with Min A cues. SLP Short Term Goal 2 (Week 2): With SLP positioned on pt's right, pt will scan to task in 8 out of 10 opportunities with Max A cues. SLP Short Term Goal 3 (Week 2): Pt will consume regular diet textures with thin liquids and minimal overt s/s of aspiration. SLP Short Term Goal 4 (Week 2): Pt will utlizie multimodal communication to  communicate wants and needs with Mod A cues. SLP Short Term Goal 5 (Week 2): Pt will complete basic familiar task with Mod A cues.  Skilled Therapeutic Interventions:  Overall pt has increased spontaneous speech but uses gestures to communicate need for bathroom etc. His attention continues to be impaired and is further complicated by right inattention. He requires Multimodal cues to follow basic directions and tasks need to be basic and almost self-evident. For example when sorting colors, he needs to have 3 bins with one color in each. So the task is inherent. During these activities, SLP has been building off pt's responses to provide appropriate sentence structure around task. Pt indicates that he heard SLP but doesn't attempt to repeat with improved structure. Pt left upright in bed, bed alarm on and all needs within reach. Continue per current plan of care.        Pain    Therapy/Group: Individual Therapy  Talyia Allende 08/30/2018, 12:24 PM

## 2018-08-31 DIAGNOSIS — Z931 Gastrostomy status: Secondary | ICD-10-CM

## 2018-08-31 NOTE — Progress Notes (Signed)
[name] PHYSICAL MEDICINE & REHABILITATION PROGRESS NOTE   Subjective/Complaints: Patient seen sitting up in bed this morning.  He slept well overnight per sleep chart.  Evaluated PEG site with nursing, discussed dressings.  ROS: limited due to cognition   Objective:   No results found. Recent Labs    08/30/18 0512  WBC 8.0  HGB 11.2*  HCT 35.5*  PLT 399   Recent Labs    08/30/18 0512  NA 138  K 4.3  CL 105  CO2 25  GLUCOSE 119*  BUN 29*  CREATININE 1.22  CALCIUM 9.6    Intake/Output Summary (Last 24 hours) at 08/31/2018 0909 Last data filed at 08/31/2018 0427 Gross per 24 hour  Intake 440 ml  Output 200 ml  Net 240 ml     Physical Exam: Vital Signs Blood pressure 132/75, pulse 64, temperature 98.5 F (36.9 C), temperature source Oral, resp. rate 18, height 5\' 6"  (1.676 m), weight 55.2 kg, SpO2 96 %. Constitutional: No distress . Vital signs reviewed. HENT: Normocephalic.  Atraumatic. Eyes: EOMI. No discharge. Cardiovascular: No JVD. Respiratory: Normal effort. GI: Non-distended.  + PEG with crusting. Musc: No edema or tenderness in extremities. Neurological: He is alert.  Global aphasia Motor: Limited by participation, however moving all 4 extremities spontaneously Skin: bruises, lacs on arms Psychiatric: Unable to assess due to cognition  Assessment/Plan: 1. 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  Physiatrist is providing close team supervision and 24 hour management of active medical problems listed below.  Physiatrist and rehab team continue to assess barriers to discharge/monitor patient progress toward functional and medical goals  Care Tool:  Bathing    Body parts bathed by patient: Right arm, Left arm, Chest, Abdomen, Front perineal area, Buttocks, Right upper leg, Left upper leg, Right lower leg, Left lower leg, Face          Bathing assist Assist Level: Supervision/Verbal cueing     Upper Body Dressing/Undressing Upper body dressing   What is the patient wearing?: Pull over shirt    Upper body assist Assist Level: Supervision/Verbal cueing    Lower Body Dressing/Undressing Lower body dressing      What is the patient wearing?: Pants     Lower body assist Assist for lower body dressing: Contact Guard/Touching assist     Toileting Toileting    Toileting assist Assist for toileting: Contact Guard/Touching assist     Transfers Chair/bed transfer  Transfers assist  Chair/bed transfer activity did not occur: N/A  Chair/bed transfer assist level: Minimal Assistance - Patient > 75%     Locomotion Ambulation   Ambulation assist      Assist level: Minimal Assistance - Patient > 75% Assistive device: Hand held assist Max distance: 150'   Walk 10 feet activity   Assist  Walk 10 feet activity did not occur: Safety/medical concerns(dynanuc balance deficits)  Assist level: Minimal Assistance - Patient > 75% Assistive device: Hand held assist   Walk 50 feet activity   Assist Walk 50 feet with 2 turns activity did not occur: Safety/medical concerns(dynamic balance deficits)  Assist level: Minimal Assistance - Patient > 75% Assistive device: Hand held assist    Walk 150 feet activity   Assist Walk 150 feet activity did not occur: Safety/medical concerns(dynamic balance deficits)  Assist level: Minimal Assistance - Patient > 75% Assistive device: Hand held assist    Walk 10 feet on  uneven surface  activity   Assist Walk 10 feet on uneven surfaces activity did not occur: Safety/medical concerns(dynamic balance deficits)   Assist level: Minimal Assistance - Patient > 75% Assistive device: Hand held assist   Wheelchair     Assist Will patient use wheelchair at discharge?: No Type of Wheelchair: Manual    Wheelchair assist level: Moderate Assistance - Patient 50  - 74% Max wheelchair distance: 150 ft    Wheelchair 50 feet with 2 turns activity    Assist    Wheelchair 50 feet with 2 turns activity did not occur: Safety/medical concerns(unable to motor plan)   Assist Level: Moderate Assistance - Patient 50 - 74%   Wheelchair 150 feet activity     Assist Wheelchair 150 feet activity did not occur: Safety/medical concerns(unable to motor plan)   Assist Level: Moderate Assistance - Patient 50 - 74%    Medical Problem List and Plan: 1.  Deficits with mobility, self-care, language, nutrition, cognition, swallowing secondary to left temporal ICH   Continue CIR 2.  Antithrombotics: -DVT/anticoagulation:  Mechanical: Sequential compression devices, below knee Bilateral lower extremities              Lower extremity Dopplers negative             -antiplatelet therapy: N/A 3. Pain Management: Oxycodone prn.  4. Mood: LCSW to follow for evaluation and support when appropriate. Continue Zoloft and Klonopin for mood stabilization.              -antipsychotic agents:   Added low dose seroquel for hs agitation/insomnia 5. Neuropsych: This patient is not capable of making decisions on his own behalf.    6. Skin/Wound Care: local care to skin  - continue PEG  care, discussed with nursing.  7. Fluids/Electrolytes/Nutrition:    -stopped HS TF   -diet advanced to regular thins  -PEG was placed on 5/21. If he continues to eat well and take in enough fluids, this can be removed next week  BMP ordered for Monday 8.  HTN: Monitor BP tid- continue amlodipine, hydralazine and lisinopril.    -increased lisinopril to 40mg  daily.   Slightly labile on 7/4 9. T2DM: Diet controlled-Hgb A1c- 5.5 at admission.    -lantus insulin and SSI d/c'ed  ?  Trending up, no CBG this a.m. 10. ABLA: Monitor for signs of bleeding.   Hemoglobin 11.2 on 7/3  Continue to monitor 11. CKD stage III:   Creatinine 1.22 on 7/3  Encourage fluids 12. Pulmonary: pt  self-decannulated .   -stoma closed, no issues.    LOS: 10 days A FACE TO FACE EVALUATION WAS PERFORMED  Erek Kowal Lorie Phenix 08/31/2018, 9:09 AM

## 2018-09-01 ENCOUNTER — Inpatient Hospital Stay (HOSPITAL_COMMUNITY): Payer: Medicare HMO

## 2018-09-01 LAB — GLUCOSE, CAPILLARY
Glucose-Capillary: 153 mg/dL — ABNORMAL HIGH (ref 70–99)
Glucose-Capillary: 114 mg/dL — ABNORMAL HIGH (ref 70–99)
Glucose-Capillary: 116 mg/dL — ABNORMAL HIGH (ref 70–99)

## 2018-09-01 NOTE — Progress Notes (Signed)
Occupational Therapy Session Note  Patient Details  Name: Tylin Force MRN: [id number] Date of Birth: [birth year]  Today's Date: 09/01/2018 OT Individual Time: 4431-5400 OT Individual Time Calculation (min): 30 min    Short Term Goals: Week 2:  OT Short Term Goal 1 (Week 2): STG=LTG d/t ELOS  Skilled Therapeutic Interventions/Progress Updates:   Pt received supine with no c/o pain. Pt transitioned to EOB with min cueing, (S). Pt completed ambulatory transfer into bathroom with CGA, no overt LOB but large postural sway. Pt voided urine and bowel and was able to complete all peri hygiene with (S). Pt donned incontinence brief and shorts with (S). Pt donned and fastened shoes with set up assist.  Pt completed 150 ft of functional mobility with min cueing for Rt attention and CGA. Pt returned to the room and unfastened shoes, returning supine. Bed alarm set.   Therapy Documentation Precautions:  Precautions Precaution Comments: recent craioplasty; PEG; trach  Restrictions Weight Bearing Restrictions: No   Therapy/Group: Individual Therapy  Curtis Sites 09/01/2018, 7:12 AM

## 2018-09-01 NOTE — Progress Notes (Signed)
[facility] PHYSICAL MEDICINE & REHABILITATION PROGRESS NOTE   Subjective/Complaints: Patient seen sitting up in bed this morning eating breakfast.  Spontaneous speech improving.  Increased ability to follow commands.  ROS: limited due to cognition, but appears to deny CP, shortness of breath, nausea, vomiting, diarrhea.   Objective:   No results found. Recent Labs    08/30/18 0512  WBC 8.0  HGB 11.2*  HCT 35.5*  PLT 399   Recent Labs    08/30/18 0512  NA 138  K 4.3  CL 105  CO2 25  GLUCOSE 119*  BUN 29*  CREATININE 1.22  CALCIUM 9.6    Intake/Output Summary (Last 24 hours) at 09/01/2018 0900 Last data filed at 09/01/2018 0751 Gross per 24 hour  Intake 924 ml  Output -  Net 924 ml     Physical Exam: Vital Signs Blood pressure 137/67, pulse 65, temperature 98.6 F (37 C), resp. rate 19, height 5\' 6"  (1.676 m), weight 55.2 kg, SpO2 97 %. Constitutional: No distress . Vital signs reviewed. HENT: Normocephalic.  Atraumatic. Eyes: EOMI.  No discharge. Cardiovascular: No JVD. Respiratory: Normal effort. GI: Non-distended.  + PEG Musc: No edema or tenderness in extremities. Neurological: He is alert.  Global aphasia, improving Motor: Limited by participation, however moving all 4 extremities spontaneously Skin: bruises, lacs on arms Psychiatric: Unable to assess due to cognition  Assessment/Plan: 1. 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  Physiatrist is providing close team supervision and 24 hour management of active medical problems listed below.  Physiatrist and rehab team continue to assess barriers to discharge/monitor patient progress toward functional and medical goals  Care Tool:  Bathing    Body parts bathed by patient: Right arm, Left arm, Chest, Abdomen, Front perineal area, Buttocks, Right upper leg, Left upper leg, Right lower leg,  Left lower leg, Face         Bathing assist Assist Level: Supervision/Verbal cueing     Upper Body Dressing/Undressing Upper body dressing   What is the patient wearing?: Pull over shirt    Upper body assist Assist Level: Supervision/Verbal cueing    Lower Body Dressing/Undressing Lower body dressing      What is the patient wearing?: Pants     Lower body assist Assist for lower body dressing: Contact Guard/Touching assist     Toileting Toileting    Toileting assist Assist for toileting: Contact Guard/Touching assist     Transfers Chair/bed transfer  Transfers assist  Chair/bed transfer activity did not occur: N/A  Chair/bed transfer assist level: Minimal Assistance - Patient > 75%     Locomotion Ambulation   Ambulation assist      Assist level: Minimal Assistance - Patient > 75% Assistive device: Hand held assist Max distance: 150'   Walk 10 feet activity   Assist  Walk 10 feet activity did not occur: Safety/medical concerns(dynanuc balance deficits)  Assist level: Minimal Assistance - Patient > 75% Assistive device: Hand held assist   Walk 50 feet activity   Assist Walk 50 feet with 2 turns activity did not occur: Safety/medical concerns(dynamic balance deficits)  Assist level: Minimal Assistance - Patient > 75% Assistive device: Hand held assist    Walk 150 feet activity   Assist Walk 150 feet activity did not occur: Safety/medical concerns(dynamic balance deficits)  Assist level: Minimal Assistance - Patient > 75% Assistive device: Hand held assist    Walk  10 feet on uneven surface  activity   Assist Walk 10 feet on uneven surfaces activity did not occur: Safety/medical concerns(dynamic balance deficits)   Assist level: Minimal Assistance - Patient > 75% Assistive device: Hand held assist   Wheelchair     Assist Will patient use wheelchair at discharge?: No Type of Wheelchair: Manual    Wheelchair assist level:  Moderate Assistance - Patient 50 - 74% Max wheelchair distance: 150 ft    Wheelchair 50 feet with 2 turns activity    Assist    Wheelchair 50 feet with 2 turns activity did not occur: Safety/medical concerns(unable to motor plan)   Assist Level: Moderate Assistance - Patient 50 - 74%   Wheelchair 150 feet activity     Assist Wheelchair 150 feet activity did not occur: Safety/medical concerns(unable to motor plan)   Assist Level: Moderate Assistance - Patient 50 - 74%    Medical Problem List and Plan: 1.  Deficits with mobility, self-care, language, nutrition, cognition, swallowing secondary to left temporal ICH   Continue CIR 2.  Antithrombotics: -DVT/anticoagulation:  Mechanical: Sequential compression devices, below knee Bilateral lower extremities              Lower extremity Dopplers negative             -antiplatelet therapy: N/A 3. Pain Management: Oxycodone prn.  4. Mood: LCSW to follow for evaluation and support when appropriate. Continue Zoloft and Klonopin for mood stabilization.              -antipsychotic agents:   Added low dose seroquel for hs agitation/insomnia 5. Neuropsych: This patient is not capable of making decisions on his own behalf.    6. Skin/Wound Care: local care to skin  - continue PEG  care, discussed with nursing.  7. Fluids/Electrolytes/Nutrition:    -stopped HS TF   -diet advanced to regular thins  -PEG was placed on 5/21. If he continues to eat well and take in enough fluids, will consider DC him this week  BMP ordered for tomorrow 8.  HTN: Monitor BP tid- continue amlodipine, hydralazine and lisinopril.    -increased lisinopril to 40mg  daily.   Controlled on 7/5 9. T2DM: Diet controlled-Hgb A1c- 5.5 at admission.    -lantus insulin and SSI d/c'ed  ?  Trending up, CBGs ordered 10. ABLA: Monitor for signs of bleeding.   Hemoglobin 11.2 on 7/3  Continue to monitor 11. CKD stage III:   Creatinine 1.22 on 7/3  Encourage fluids 12.  Pulmonary: pt self-decannulated .   -stoma closed, no issues.    LOS: 11 days A FACE TO FACE EVALUATION WAS PERFORMED  Sangeeta Youse Karis Juba 09/01/2018, 9:00 AM

## 2018-09-02 ENCOUNTER — Inpatient Hospital Stay (HOSPITAL_COMMUNITY): Payer: Medicare HMO | Admitting: Occupational Therapy

## 2018-09-02 ENCOUNTER — Inpatient Hospital Stay (HOSPITAL_COMMUNITY): Payer: Medicare HMO

## 2018-09-02 ENCOUNTER — Inpatient Hospital Stay (HOSPITAL_COMMUNITY): Payer: Medicare HMO | Admitting: Speech Pathology

## 2018-09-02 LAB — CBC
HCT: 35.1 % — ABNORMAL LOW (ref 39.0–52.0)
Hemoglobin: 11.2 g/dL — ABNORMAL LOW (ref 13.0–17.0)
MCH: 28.4 pg (ref 26.0–34.0)
MCHC: 31.9 g/dL (ref 30.0–36.0)
MCV: 88.9 fL (ref 80.0–100.0)
Platelets: 406 10*3/uL — ABNORMAL HIGH (ref 150–400)
RBC: 3.95 MIL/uL — ABNORMAL LOW (ref 4.22–5.81)
RDW: 17.4 % — ABNORMAL HIGH (ref 11.5–15.5)
WBC: 8.3 10*3/uL (ref 4.0–10.5)
nRBC: 0 % (ref 0.0–0.2)

## 2018-09-02 LAB — GLUCOSE, CAPILLARY
Glucose-Capillary: 120 mg/dL — ABNORMAL HIGH (ref 70–99)
Glucose-Capillary: 139 mg/dL — ABNORMAL HIGH (ref 70–99)
Glucose-Capillary: 112 mg/dL — ABNORMAL HIGH (ref 70–99)
Glucose-Capillary: 102 mg/dL — ABNORMAL HIGH (ref 70–99)

## 2018-09-02 LAB — BASIC METABOLIC PANEL
Anion gap: 9 (ref 5–15)
BUN: 28 mg/dL — ABNORMAL HIGH (ref 8–23)
CO2: 26 mmol/L (ref 22–32)
Calcium: 9.6 mg/dL (ref 8.9–10.3)
Chloride: 105 mmol/L (ref 98–111)
Creatinine, Ser: 1.27 mg/dL — ABNORMAL HIGH (ref 0.61–1.24)
GFR calc Af Amer: >60 mL/min (ref >60)
GFR calc non Af Amer: 57 mL/min — ABNORMAL LOW (ref >60)
Glucose, Bld: 116 mg/dL — ABNORMAL HIGH (ref 70–99)
Potassium: 4.5 mmol/L (ref 3.5–5.1)
Sodium: 140 mmol/L (ref 135–145)

## 2018-09-02 MED ORDER — FAMOTIDINE 20 MG PO TABS: 20.0000 mg | ORAL_TABLET | Freq: Every day | ORAL | Status: DC

## 2018-09-02 MED ORDER — FAMOTIDINE 20 MG PO TABS
20.0000 mg | ORAL_TABLET | Freq: Two times a day (BID) | ORAL | Status: DC
  Administered 2018-09-02 – 2018-09-05 (×6): 20 mg via ORAL
  Filled 2018-09-02 (×6): qty 1

## 2018-09-02 NOTE — Progress Notes (Signed)
Physical Therapy Session Note  Patient Details  Name: Mark [name] MRN: [id number] Date of Birth: 12-01-1950  Today's Date: 09/02/2018 PT Individual Time: 1005-1050 PT Individual Time Calculation (min): 45 min   Short Term Goals: Week 2:  PT Short Term Goal 1 (Week 2): Pt will negotiate 12 steps with B rails & min assist. PT Short Term Goal 2 (Week 2): Pt will negotiate 2 steps without rails with LRAD & mod assist. PT Short Term Goal 3 (Week 2): Pt will ambulate 75 ft with LRAD & supervision.  Skilled Therapeutic Interventions/Progress Updates:     Patient in bed upon PT arrival. Patient alert and agreeable to PT session.  Therapeutic Activity: Bed Mobility: Patient performed supine to/from sit with supervison.  Transfers: Patient performed sit to/from stand x8 with supervision-CGA. Provided verbal cues for reaching back to sit.  Gait Training:  Patient ambulated 150 feet, 200 feet, and 100 feet using no AD with CGA and min A x1 for minor LOB. Ambulated with decreased gait speed, decreased BOS, variable foot placement, and veering to the R. Provided verbal cues for increased BOS. Asked patient to perform horizontal head turns and patient immediately veered to R and has LOB requiring min A to correct.  He performed blocked practice for stair training. He went up/down 4 steps using B rails with CGA x1, up/down 4 steps without rails with min A x1, and performed step ups to 1 step using 1 rail x10 with CGA. He then went up/down the steps without rails with CGA-min A. He then performed step downs from 1 steps x10 using 1 rails and again went up/down the steps without the rails with CGA ascending and min A-CGA descending. Provided 2 seated rest breaks between activities.  Neuromuscular Re-ed: Patient performed step taps with 5 different colored cones with therapist stating which color to tap. This was very challenging to the patient and he tapped the correct color 0/3 trials. Adjusted task  to 2 colored cones and patient able to tap the correct color 7/10 trials. He had difficulty stating the color yellow to therapist either saying pink or red. He was consistently able to say the color blue to therapist 3/3 trials.  Patient in bed at end of session with breaks locked, bed alarm set, and all needs within reach.    Therapy Documentation Precautions:  Precautions Precaution Comments: recent craioplasty; PEG; trach  Restrictions Weight Bearing Restrictions: No Pain: Patient denied pain and displayed no outward signs of distress during session.   Therapy/Group: Individual Therapy  Michaline Kindig L Tara Wich PT, DPT  09/02/2018, 12:35 PM

## 2018-09-02 NOTE — Progress Notes (Signed)
[facility] PHYSICAL MEDICINE & REHABILITATION PROGRESS NOTE   Subjective/Complaints: Patient seen sitting up in his chair working with therapy this morning.  He slept well overnight per sleep chart.  ROS: limited due to cognition   Objective:   No results found. Recent Labs    09/02/18 0617  WBC 8.3  HGB 11.2*  HCT 35.1*  PLT 406*   Recent Labs    09/02/18 0617  NA 140  K 4.5  CL 105  CO2 26  GLUCOSE 116*  BUN 28*  CREATININE 1.27*  CALCIUM 9.6    Intake/Output Summary (Last 24 hours) at 09/02/2018 1103 Last data filed at 09/02/2018 0738 Gross per 24 hour  Intake 759 ml  Output -  Net 759 ml     Physical Exam: Vital Signs Blood pressure 128/76, pulse 64, temperature 98.3 F (36.8 C), temperature source Oral, resp. rate 17, height 5\' 6"  (1.676 m), weight 55.2 kg, SpO2 96 %. Constitutional: No distress . Vital signs reviewed. HENT: Normocephalic.  Atraumatic. Eyes: EOMI.  No discharge. Cardiovascular: No JVD. Respiratory: Normal effort. GI: Non-distended.  + PEG Musc: No edema or tenderness in extremities. Neurological: He is alert Global aphasia, persistent  Motor: Limited by participation, however moving all 4 extremities spontaneously Skin: bruises, lacs on arms Psychiatric: Unable to assess due to cognition  Assessment/Plan: 1. 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  Physiatrist is providing close team supervision and 24 hour management of active medical problems listed below.  Physiatrist and rehab team continue to assess barriers to discharge/monitor patient progress toward functional and medical goals  Care Tool:  Bathing    Body parts bathed by patient: Right arm, Left arm, Chest, Abdomen, Front perineal area, Buttocks, Right upper leg, Left upper leg, Right lower leg, Left lower leg, Face         Bathing assist Assist Level:  Supervision/Verbal cueing     Upper Body Dressing/Undressing Upper body dressing   What is the patient wearing?: Pull over shirt    Upper body assist Assist Level: Supervision/Verbal cueing    Lower Body Dressing/Undressing Lower body dressing      What is the patient wearing?: Pants     Lower body assist Assist for lower body dressing: Contact Guard/Touching assist     Toileting Toileting    Toileting assist Assist for toileting: Supervision/Verbal cueing     Transfers Chair/bed transfer  Transfers assist  Chair/bed transfer activity did not occur: N/A  Chair/bed transfer assist level: Contact Guard/Touching assist     Locomotion Ambulation   Ambulation assist      Assist level: Minimal Assistance - Patient > 75% Assistive device: Hand held assist Max distance: 150'   Walk 10 feet activity   Assist  Walk 10 feet activity did not occur: Safety/medical concerns(dynanuc balance deficits)  Assist level: Minimal Assistance - Patient > 75% Assistive device: Hand held assist   Walk 50 feet activity   Assist Walk 50 feet with 2 turns activity did not occur: Safety/medical concerns(dynamic balance deficits)  Assist level: Minimal Assistance - Patient > 75% Assistive device: Hand held assist    Walk 150 feet activity   Assist Walk 150 feet activity did not occur: Safety/medical concerns(dynamic balance deficits)  Assist level: Minimal Assistance - Patient > 75% Assistive device: Hand held assist    Walk 10 feet on uneven surface  activity   Assist Walk 10  feet on uneven surfaces activity did not occur: Safety/medical concerns(dynamic balance deficits)   Assist level: Minimal Assistance - Patient > 75% Assistive device: Hand held assist   Wheelchair     Assist Will patient use wheelchair at discharge?: No Type of Wheelchair: Manual    Wheelchair assist level: Moderate Assistance - Patient 50 - 74% Max wheelchair distance: 150 ft     Wheelchair 50 feet with 2 turns activity    Assist    Wheelchair 50 feet with 2 turns activity did not occur: Safety/medical concerns(unable to motor plan)   Assist Level: Moderate Assistance - Patient 50 - 74%   Wheelchair 150 feet activity     Assist Wheelchair 150 feet activity did not occur: Safety/medical concerns(unable to motor plan)   Assist Level: Moderate Assistance - Patient 50 - 74%    Medical Problem List and Plan: 1.  Deficits with mobility, self-care, language, nutrition, cognition, swallowing secondary to left temporal ICH   Continue CIR 2.  Antithrombotics: -DVT/anticoagulation:  Mechanical: Sequential compression devices, below knee Bilateral lower extremities              Lower extremity Dopplers negative  -antiplatelet therapy: N/A 3. Pain Management: Oxycodone prn.  4. Mood: LCSW to follow for evaluation and support when appropriate. Continue Zoloft and Klonopin for mood stabilization.              -antipsychotic agents:   Added low dose seroquel for hs agitation/insomnia 5. Neuropsych: This patient is not capable of making decisions on his own behalf.    6. Skin/Wound Care: local care to skin  - continue PEG  care, discussed with nursing.  7. Fluids/Electrolytes/Nutrition:    -stopped HS TF   -diet advanced to regular thins  -PEG was placed on 5/21.  Eating well, will plan to DC soon, needs to have adequate fluid  Elevated BUN/creatinine-encourage fluids 8.  HTN: Monitor BP tid- continue amlodipine, hydralazine and lisinopril.    -increased lisinopril to 40mg  daily.   Controlled on 7/6 9. T2DM: Diet controlled-Hgb A1c- 5.5 at admission.    -lantus insulin and SSI d/c'ed  Slightly elevated on 7/30 10. ABLA: Monitor for signs of bleeding.   Hemoglobin 11.2 on 7/6  Continue to monitor 11. CKD stage III:   Creatinine 1.27 on 7/6  Encourage fluids 12. Pulmonary: pt self-decannulated .   -stoma closed, no issues.    LOS: 12 days A FACE TO  FACE EVALUATION WAS PERFORMED  Yossi Hinchman Lorie Phenix 09/02/2018, 11:03 AM

## 2018-09-02 NOTE — Progress Notes (Signed)
Nutrition Follow-up  DOCUMENTATION CODES:   Severe malnutrition in context of chronic illness  INTERVENTION:   - Continue liquid MVI daily  - Continue Magic cup TID with meals, each supplement provides 290 kcal and 9 grams of protein  NUTRITION DIAGNOSIS:   Severe Malnutrition related to chronic illness (dysphagia, respiratory failure) as evidenced by moderate fat depletion, severe fat depletion, moderate muscle depletion, severe muscle depletion.  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 Pt underwent left craniectomy for evacuation of hematoma after partial embolization of AVM. Pt was taken back to OR on 07/18/18 for resection of residual AVM with cranioplasty and PEG placement. Had tracheostomy placed on 07/20/18. Pt had difficulty with vent wean due to issues with agitation as well as copious secretions. Pt had issues with somnolence, right hemiparesis as well as aphasia. Pt was transferred to [facility] on 08/01/18 for medical management and vent wean. Pt tolerated vent wean and trach downsized to CFS #4 with plugging on 08/20/18. Pt remains NPO with ice chips and tube feeds ongoing for nutritional support. Pt admitted to CIR on 08/21/18.  6/26 - diet advanced to Dysphagia 2 with thin liquids 6/28 - pt self-decannulated 7/01 - nocturnal TF d/c 7/02 - diet advanced to regular textures  Noted targeted d/c date of 7/09. Plan to d/c PEG prior to d/c.  Weight on  7/01 up 1 lb since admission.  Spoke with pt briefly at bedside. Pt reports that his appetite is good. Pt is eagerly awaiting lunch meal tray.  Meal Completion: 55-100% x last 8 recorded meals (averaging 86%)  Medications reviewed and include: Pepcid, free water 50 ml BID per tube, Bacid, liquid MVI, Miralax, thiamine  Labs reviewed. CBG's: 114-139 x 24 hours  Diet Order:   Diet Order            Diet Carb Modified Fluid consistency: Thin; Room service appropriate? Yes  Diet effective now              EDUCATION NEEDS:   No education needs have been identified at this time  Skin:  Skin Assessment: Reviewed RN Assessment  Last BM:  09/02/18 large type 4  Height:   Ht Readings from Last 1 Encounters:  08/21/18 5\' 6"  (1.676 m)    Weight:   Wt Readings from Last 1 Encounters:  08/28/18 55.2 kg    Ideal Body Weight:  64.5 kg  BMI:  Body mass index is 19.65 kg/m.  Estimated Nutritional Needs:   Kcal:  1550-1750  Protein:  75-90 grams  Fluid:  >/= 1.5 L    Gaynell Face, MS, RD, LDN Inpatient Clinical Dietitian Pager: [phone] Weekend/After Hours: [phone]

## 2018-09-02 NOTE — Progress Notes (Signed)
Occupational Therapy Session Note  Patient Details  Name: Mark [name] MRN: [id number] Date of Birth: 12/15/1950  Today's Date: 09/02/2018 OT Individual Time: 1310-1420 OT Individual Time Calculation (min): 70 min    Short Term Goals: Week 2:  OT Short Term Goal 1 (Week 2): STG=LTG d/t ELOS  Skilled Therapeutic Interventions/Progress Updates:    Pt received supine with no c/o pain. Pt attempting to describe events in AM but unable to fully find appropriate words. Reviewed chart to assist pt in story telling. Pt completed 150 ft of functional mobility with CGA to therapy gym. Pt completed several seated tasks focused on Rt scanning/attention and R FMC. Pt required mod cueing to complete novel matching task with several colors. With initial tactile and verbal cueing pt able to carryover scanning head to R to compensate for R neglect. Pt completed functional mobility task while holding a board with cups balancing on top, demonstrating improvement with balance, likely d/t focused attention on task and not becoming distracted with outside stimuli. Pt returned to room 2/2 suspicion of incontinence. Pt transferred to toilet and voided urine. Brief changed and pt performed all toileting tasks with (S). Pt completed Dynavision with only min cueing required for rt attention-big improvement! Pt utilized head turning and scanning strategy to compensate for R neglect. L: 3.66 and R: 6.74 reaction time. Pt then completed another 200 ft of functional mobility before returning to room. He was left supine in bed with all needs met, bed alarm set.   Therapy Documentation Precautions:  Precautions Precaution Comments: recent craioplasty; PEG; trach  Restrictions Weight Bearing Restrictions: No   Therapy/Group: Individual Therapy  Curtis Sites 09/02/2018, 7:13 AM

## 2018-09-02 NOTE — Progress Notes (Signed)
Speech Language Pathology Daily Session Note  Patient Details  Name: Mark [name] MRN: [id number] Date of Birth: [birth year]  Today's Date: 09/02/2018 SLP Individual Time: 1115-1155 SLP Individual Time Calculation (min): 40 min  Short Term Goals: Week 2: SLP Short Term Goal 1 (Week 2): Pt will demonstrate selecitve attention to task for 30 minutes with Min A cues. SLP Short Term Goal 2 (Week 2): With SLP positioned on pt's right, pt will scan to task in 8 out of 10 opportunities with Max A cues. SLP Short Term Goal 3 (Week 2): Pt will consume regular diet textures with thin liquids and minimal overt s/s of aspiration. SLP Short Term Goal 4 (Week 2): Pt will utlizie multimodal communication to  communicate wants and needs with Mod A cues. SLP Short Term Goal 5 (Week 2): Pt will complete basic familiar task with Mod A cues.  Skilled Therapeutic Interventions: Skilled treatment session focused on communication goals. SLP facilitated session by providing a sequencing task that focused on comprehension, problem solving and communication within a context. Patient sequenced 4-step picture cards with Min A verbal cues but required Max-Total A to verbalize items/events happening within the pictures. However, patient's verbal expression significantly improved during an informal conversation when entire phrases were intelligible and patient was able to express needs/ideas. Patient left supine in bed with alarm on and all needs within reach. Continue with current plan of care.      Pain No/Denies Pain   Therapy/Group: Individual Therapy  Shamonique Battiste 09/02/2018, 12:43 PM

## 2018-09-02 NOTE — Progress Notes (Signed)
Occupational Therapy Session Note  Patient Details  Name: Damari Suastegui MRN: [id number] Date of Birth: 07-26-1950  Today's Date: 09/02/2018 OT Individual Time: 7026-3785 OT Individual Time Calculation (min): 42 min    Short Term Goals: Week 2:  OT Short Term Goal 1 (Week 2): STG=LTG d/t ELOS  Skilled Therapeutic Interventions/Progress Updates:     Treatment session with focus on ADL retraining.  Pt received upright in bed finishing breakfast with supervision from nurse tech.  Pt agreeable to bathing at sink.  Ambulated to sink with HHA and completed oral care in standing.  Mod cues for initiation, use of soap, and sequencing during bathing at sink.  Min cues for UB dressing as pt demonstrating decreased awareness of error when threading RUE.  Pt demonstrating increased difficulty with following one step directions this session, requiring demonstration cues to increase success.  Pt returned to bed HHA and left semi-reclined with all needs in reach.  Therapy Documentation Precautions:  Precautions Precaution Comments: recent craioplasty; PEG; trach  Restrictions Weight Bearing Restrictions: No General:   Vital Signs: Therapy Vitals BP: 128/76 Pain:  Pt with no c/o pain   Therapy/Group: Individual Therapy  Sarahy Creedon, [place] 09/02/2018, 9:02 AM

## 2018-09-03 ENCOUNTER — Ambulatory Visit (HOSPITAL_COMMUNITY): Payer: Medicare HMO | Admitting: Physical Therapy

## 2018-09-03 ENCOUNTER — Encounter (HOSPITAL_COMMUNITY): Payer: Medicare HMO | Admitting: Speech Pathology

## 2018-09-03 ENCOUNTER — Encounter (HOSPITAL_COMMUNITY): Payer: Medicare HMO

## 2018-09-03 ENCOUNTER — Inpatient Hospital Stay (HOSPITAL_COMMUNITY): Payer: Medicare HMO | Admitting: Rehabilitation

## 2018-09-03 LAB — GLUCOSE, CAPILLARY
Glucose-Capillary: 107 mg/dL — ABNORMAL HIGH (ref 70–99)
Glucose-Capillary: 142 mg/dL — ABNORMAL HIGH (ref 70–99)
Glucose-Capillary: 113 mg/dL — ABNORMAL HIGH (ref 70–99)
Glucose-Capillary: 112 mg/dL — ABNORMAL HIGH (ref 70–99)

## 2018-09-03 MED ORDER — ADULT MULTIVITAMIN W/MINERALS CH
1.0000 | ORAL_TABLET | Freq: Every day | ORAL | Status: DC
  Administered 2018-09-03 – 2018-09-05 (×3): 1 via ORAL
  Filled 2018-09-03 (×3): qty 1

## 2018-09-03 NOTE — Progress Notes (Signed)
[place] [facility] PHYSICAL MEDICINE & REHABILITATION PROGRESS NOTE   Subjective/Complaints: Patient seen laying in bed this morning.  He states he slept well overnight, confirmed with sleep chart.  ROS: limited due to cognition   Objective:   No results found. Recent Labs    09/02/18 0617  WBC 8.3  HGB 11.2*  HCT 35.1*  PLT 406*   Recent Labs    09/02/18 0617  NA 140  K 4.5  CL 105  CO2 26  GLUCOSE 116*  BUN 28*  CREATININE 1.27*  CALCIUM 9.6    Intake/Output Summary (Last 24 hours) at 09/03/2018 1101 Last data filed at 09/03/2018 0826 Gross per 24 hour  Intake 560 ml  Output -  Net 560 ml     Physical Exam: Vital Signs Blood pressure (!) 141/72, pulse (!) 58, temperature 98.2 F (36.8 C), temperature source Oral, resp. rate 20, height 5\' 6"  (1.676 m), weight 55.2 kg, SpO2 96 %. Constitutional: No distress . Vital signs reviewed. HENT: Normocephalic.  Atraumatic. Eyes: EOMI.  No discharge. Cardiovascular: No JVD. Respiratory: Normal effort. GI: Non-distended.  + PEG Musc: No edema or tenderness in extremities. Neurological: He is alert x1 Global aphasia, unchanged Motor: Limited by participation, however moving all 4 extremities spontaneously Skin: bruises, lacs on arms Psychiatric: Unable to assess due to cognition  Assessment/Plan: 1. 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  Physiatrist is providing close team supervision and 24 hour management of active medical problems listed below.  Physiatrist and rehab team continue to assess barriers to discharge/monitor patient progress toward functional and medical goals  Care Tool:  Bathing    Body parts bathed by patient: Right arm, Left arm, Chest, Abdomen, Front perineal area, Buttocks, Right upper leg, Left upper leg, Right lower leg, Left lower leg, Face         Bathing assist Assist Level:  Supervision/Verbal cueing     Upper Body Dressing/Undressing Upper body dressing   What is the patient wearing?: Pull over shirt    Upper body assist Assist Level: Supervision/Verbal cueing    Lower Body Dressing/Undressing Lower body dressing      What is the patient wearing?: Pants     Lower body assist Assist for lower body dressing: Contact Guard/Touching assist     Toileting Toileting    Toileting assist Assist for toileting: Supervision/Verbal cueing     Transfers Chair/bed transfer  Transfers assist  Chair/bed transfer activity did not occur: N/A  Chair/bed transfer assist level: Contact Guard/Touching assist     Locomotion Ambulation   Ambulation assist      Assist level: Minimal Assistance - Patient > 75% Assistive device: No Device Max distance: 200'   Walk 10 feet activity   Assist  Walk 10 feet activity did not occur: Safety/medical concerns(dynanuc balance deficits)  Assist level: Minimal Assistance - Patient > 75% Assistive device: No Device   Walk 50 feet activity   Assist Walk 50 feet with 2 turns activity did not occur: Safety/medical concerns(dynamic balance deficits)  Assist level: Minimal Assistance - Patient > 75% Assistive device: No Device    Walk 150 feet activity   Assist Walk 150 feet activity did not occur: Safety/medical concerns(dynamic balance deficits)  Assist level: Minimal Assistance - Patient > 75% Assistive device: No Device    Walk 10 feet on uneven surface  activity   Assist Walk 10 feet on uneven surfaces  activity did not occur: Safety/medical concerns(dynamic balance deficits)   Assist level: Minimal Assistance - Patient > 75% Assistive device: Hand held assist   Wheelchair     Assist Will patient use wheelchair at discharge?: No Type of Wheelchair: Manual    Wheelchair assist level: Moderate Assistance - Patient 50 - 74% Max wheelchair distance: 150 ft    Wheelchair 50 feet with 2  turns activity    Assist    Wheelchair 50 feet with 2 turns activity did not occur: Safety/medical concerns(unable to motor plan)   Assist Level: Moderate Assistance - Patient 50 - 74%   Wheelchair 150 feet activity     Assist Wheelchair 150 feet activity did not occur: Safety/medical concerns(unable to motor plan)   Assist Level: Moderate Assistance - Patient 50 - 74%    Medical Problem List and Plan: 1.  Deficits with mobility, self-care, language, nutrition, cognition, swallowing secondary to left temporal ICH   Continue CIR  Team conference today to discuss current and goals and coordination of care, home and environmental barriers, and discharge planning with nursing, case manager, and therapies.  2.  Antithrombotics: -DVT/anticoagulation:  Mechanical: Sequential compression devices, below knee Bilateral lower extremities              Lower extremity Dopplers negative  -antiplatelet therapy: N/A 3. Pain Management: Oxycodone prn.  4. Mood: LCSW to follow for evaluation and support when appropriate. Continue Zoloft and Klonopin for mood stabilization.              -antipsychotic agents:   Added low dose seroquel for hs agitation/insomnia 5. Neuropsych: This patient is not capable of making decisions on his own behalf.    6. Skin/Wound Care: local care to skin 7. Fluids/Electrolytes/Nutrition:    -stopped HS TF   -diet advanced to regular thins  Plan to DC PEG tomorrow  Encourage fluids 8.  HTN: Monitor BP tid- continue amlodipine, hydralazine and lisinopril.    -increased lisinopril to 40mg  daily.   Controlled on 7/7 9. T2DM: Diet controlled-Hgb A1c- 5.5 at admission.    -lantus insulin and SSI d/c'ed  Slightly labile on 7/7 10. ABLA: Monitor for signs of bleeding.   Hemoglobin 11.2 on 7/6  Continue to monitor 11. CKD stage III:   Creatinine 1.27 on/6  Encourage fluids 12. Pulmonary: pt self-decannulated .   -stoma closed, no issues.    LOS: 13 days A FACE  TO FACE EVALUATION WAS PERFORMED  Zilphia Kozinski Karis Juba 09/03/2018, 11:01 AM

## 2018-09-03 NOTE — Progress Notes (Signed)
Occupational Therapy Session Note  Patient Details  Name: Dat Derksen MRN: [id number] Date of Birth: 01-Nov-1950  Today's Date: 09/03/2018 OT Individual Time: 2778-2423 OT Individual Time Calculation (min): 55 min    Short Term Goals: Week 2:  OT Short Term Goal 1 (Week 2): STG=LTG d/t ELOS  Skilled Therapeutic Interventions/Progress Updates:    Family education session completed with pt's son and daughter. Both were participative and asked appropriate question. Reviewed pt incontinence and concept of timed toileting to increase continent episodes. At end of session pt completed toileting with (S) and family was given examples of ways to promote cleanliness and skin integrity. Reviewed aphasia extensively and ensured family that SLP would do more thorough edu re aphasia. Pt completed 150 ft of functional mobility to ADL apartment where edu and demonstration was provided re use of TTB and BSC. Pt completed TTB demonstration with (S). Pt required close (S) for functional mobility throughout session, completing 200 + ft. Discussed attention deficits and strategies in the home to reduce fall risk. Pt completed Dynavision to illustrate R neglect to family, with L 3.06 sec and R 6.05 with no cueing- which is a great improvement. Discussed types of activities pt could complete at home to continue working on deficits but also reviewed burden of care with family and stressed for them to not feel pressure to constantly provide "therapy" to pt. Pt was left supine with all needs met, family present.   Therapy Documentation Precautions:  Precautions Precaution Comments: recent craioplasty; PEG; trach  Restrictions Weight Bearing Restrictions: No   Therapy/Group: Individual Therapy  Curtis Sites 09/03/2018, 7:13 AM

## 2018-09-03 NOTE — Progress Notes (Signed)
Brief Nutrition Note  RD was contacted by LCSW regarding pt's family requesting nutrition education. Spoke with pt's daughter via phone call to discuss pt's nutritional needs after discharge.  RD provided multiple hands to pt's daughter via email: 1. "High-Calorie High-Protein Nutrition Therapy" 2. "Tips for Increasing Calories" 3. "Tips for Adding Protein"  Discussed examples of ways to increase caloric density of foods and beverages frequently consumed by the patient. Also provided ideas to promote variety and to incorporate additional nutrient dense foods into patient's diet. Discussed eating small frequent meals and snacks to assist in increasing overall po intake. Teach back method used.  Also provided pt's daughter with a list of recommended oral nutrition supplements and unflavored protein powders that can be mixed in various foods to add protein.  Discussed pt's calorie, protein, and fluid needs at discharge.  Discussed that pt's T2DM was well controlled on admission. Discussed importance of limiting sodium intake.  Pt's daughter plans to use this information to go grocery shopping for pt prior to d/c.  Expect good compliance.  Body mass index is 19.65 kg/m. Pt meets criteria for normal weight based on current BMI. RD will continue to follow pt during admission.   Gaynell Face, MS, RD, LDN Inpatient Clinical Dietitian Pager: [phone] Weekend/After Hours: [phone]

## 2018-09-03 NOTE — Progress Notes (Signed)
Physical Therapy Session Note  Patient Details  Name: Mark [name] MRN: [id number] Date of Birth: May 05, 1950  Today's Date: 09/03/2018 PT Individual Time: 8657-8469 PT Individual Time Calculation (min): 59 min   Short Term Goals: Week 2:  PT Short Term Goal 1 (Week 2): Pt will negotiate 12 steps with B rails & min assist. PT Short Term Goal 2 (Week 2): Pt will negotiate 2 steps without rails with LRAD & mod assist. PT Short Term Goal 3 (Week 2): Pt will ambulate 75 ft with LRAD & supervision.  Skilled Therapeutic Interventions/Progress Updates:    Pt received supine in bed and agreeable to therapy session. When therapist educated pt that focus of today's session is on family education he reports that he thought he would be going home with them today - therapist educated pt on discharge plan. Supine>sit with supervision for safety. Sit<>stands with CGA for steadying throughout session. Ambulated in room with CGA for steadying and performed standing oral care at sink with CGA/supervision. Ambulated ~185ft, no AD, to therapy gym with CGA for steadying and pt's son and daughter arrived for family education. Pt's family educated on pt's impairments including Wernicke's aphasia, R inattention, and impaired safety awareness and the need for 24hr support at discharged. Pt's son/daughter report they are prepared to provide this level of support. Pt ascended/descended 4 steps x 6 trials without handrails - therapist providing min assist initially while educating family on proper positioning, use of gait belt, and assist level to provide for patient safety - then pt performed x2 trails with each family member with therapist providing cuing for proper positioning and assistance - pt's family demonstrates understanding of assisting pt with stair navigation. Therapist educated family on recommendation for stair handrail at home for increased pt safety. Ambulated ~170ft, no AD, with family providing CGA for  steadying and no assist from therapist. Pt performed ambulatory car transfer (mid SUV height) with family providing CGA and therapist demonstrating proper sequencing of transfer for safety and pt/family demonstrating understanding of car transfer without assist from therapist. Ambulated ~160ft, no AD, with pt's other family member providing proper CGA without assist from therapist.  Pt/family educated on fall risk safety in the home and what to do in the event of a fall. Pt performed floor transfer with min assist from therapist for pivoting hips to mat. Pt ambulated ~169ft back to room with CGA from family member. Pt's family reports no further questions/concerns and demonstrated excellent understanding of all education throughout session. Pt left supine in bed with family members present and OT arriving to assume care of patient.   Therapy Documentation Precautions:  Precautions Precaution Comments: recent craioplasty; PEG; trach  Restrictions Weight Bearing Restrictions: No  Pain: Denis pain during session.    Therapy/Group: Individual Therapy  Tawana Scale, PT, DPT 09/03/2018, 7:50 AM

## 2018-09-03 NOTE — Progress Notes (Signed)
Speech Language Pathology Daily Session Note  Patient Details  Name: Mark [name] MRN: [id number] Date of Birth: May 19, 1950  Today's Date: 09/03/2018 SLP Individual Time: 1100-1140 SLP Individual Time Calculation (min): 40 min  Short Term Goals: Week 2: SLP Short Term Goal 1 (Week 2): Pt will demonstrate selecitve attention to task for 30 minutes with Min A cues. SLP Short Term Goal 2 (Week 2): With SLP positioned on pt's right, pt will scan to task in 8 out of 10 opportunities with Max A cues. SLP Short Term Goal 3 (Week 2): Pt will consume regular diet textures with thin liquids and minimal overt s/s of aspiration. SLP Short Term Goal 4 (Week 2): Pt will utlizie multimodal communication to  communicate wants and needs with Mod A cues. SLP Short Term Goal 5 (Week 2): Pt will complete basic familiar task with Mod A cues.  Skilled Therapeutic Interventions: Skilled treatment session focused on completion of patient and family education with the patient's son and daughter.  Both were educated on patient's current communication deficits and strategies to utilize at home to maximize auditory comprehension and verbal expression. Both were educated on the importance of establishing a routine at home and patient needing 24 hour supervision at discharge to ensure safety and safe decision making. Patient is currently on regular textures with thin liquids and educated provided on compensatory strategies and medication administration. Handouts were also given to reinforce information. All verbalized understanding of information. Patient left upright in bed with alarm on and all needs within reach. Continue with current plan of care.       Pain No/Denies Pain   Therapy/Group: Individual Therapy  [name] 09/03/2018, 4:19 PM

## 2018-09-03 NOTE — Progress Notes (Signed)
Physical Therapy Session Note  Patient Details  Name: Mark [name] MRN: [id number] Date of Birth: 1950-04-04  Today's Date: 09/03/2018 PT Individual Time: 8413-2440 PT Individual Time Calculation (min): 54 min   Short Term Goals: Week 1:  PT Short Term Goal 1 (Week 1): pt will transfer consistently bed>< w/c with supervision PT Short Term Goal 1 - Progress (Week 1): Progressing toward goal PT Short Term Goal 2 (Week 1): pt will perform gait with LRAD x 50' including turns PT Short Term Goal 2 - Progress (Week 1): Met PT Short Term Goal 3 (Week 1): pt will negotiate 12 steps 2 rails with close supervision PT Short Term Goal 3 - Progress (Week 1): Progressing toward goal PT Short Term Goal 4 (Week 1): pt will negotiate 1 curb/step with min assist and LRAD PT Short Term Goal 4 - Progress (Week 1): Progressing toward goal Week 2:  PT Short Term Goal 1 (Week 2): Pt will negotiate 12 steps with B rails & min assist. PT Short Term Goal 2 (Week 2): Pt will negotiate 2 steps without rails with LRAD & mod assist. PT Short Term Goal 3 (Week 2): Pt will ambulate 75 ft with LRAD & supervision.  Skilled Therapeutic Interventions/Progress Updates:   Pt received lying in bed, agreeable to therapy session.  Pt nodding "yes" when asked if needing to use restroom prior to leaving room.  Performed bed mobility at S level, however needs CGA to stand and ambulate to restroom safely.  Note pt sat on bedside commode then stood again to manage clothing.  Cues and CGA to guide pt to sink to wash hands.  Pt able to stand and wash hands without support at S level.  Pt ambulated >200' during session without device at [facility] to min A level.  PT provided challenges during gait such as sudden stops, turns, looking up/down and side/side.  Pt had most difficulty with head turns, causing narrow BOS and intermittent scissoring with min A to correct.  Ambulated to day room to perform bean bag toss.  Had pt retrieve bean bag from  floor and then step to toss for increased balance challenge.  Pt with poor frustration during task but did complete two rounds, retrieving bean bags following first round.  Despite cuing, pt tossed all bean bags to the R onto floor instead of wooden board.  Ambulated back to main gym and performed standing balance on airex with feet apart EO head turns side/side and up/down x 10 reps without UE support.  Min/guard and max cues to continue with task.  Step up/down to airex x 2 sets of 10 (forwards/backwards) with UE support initially progressing to without UE support.  Ended with marching on airex x 20 reps without UE support.  Attempted to have pt participate in standing task on airex while performing pipe tree.  Intermittent min/guard due to posterior lean and max verbal cues to attend to task.  Pt only able to match 3 pieces of pipe tree.  Pt ambulated back to room and left in bed with bed alarm set and all needs in reach.   Therapy Documentation Precautions:  Precautions Precaution Comments: recent craioplasty; PEG; trach  Restrictions Weight Bearing Restrictions: No   Pain: Pt reports no pain during session.     Therapy/Group: Individual Therapy  [name] 09/03/2018, 3:27 PM

## 2018-09-04 ENCOUNTER — Inpatient Hospital Stay (HOSPITAL_COMMUNITY): Payer: Medicare HMO

## 2018-09-04 ENCOUNTER — Inpatient Hospital Stay (HOSPITAL_COMMUNITY): Payer: Medicare HMO | Admitting: Speech Pathology

## 2018-09-04 ENCOUNTER — Inpatient Hospital Stay (HOSPITAL_COMMUNITY): Payer: Medicare HMO | Admitting: Physical Therapy

## 2018-09-04 ENCOUNTER — Encounter (HOSPITAL_COMMUNITY): Payer: Medicare HMO | Admitting: Psychology

## 2018-09-04 LAB — GLUCOSE, CAPILLARY
Glucose-Capillary: 119 mg/dL — ABNORMAL HIGH (ref 70–99)
Glucose-Capillary: 170 mg/dL — ABNORMAL HIGH (ref 70–99)
Glucose-Capillary: 99 mg/dL (ref 70–99)
Glucose-Capillary: 132 mg/dL — ABNORMAL HIGH (ref 70–99)

## 2018-09-04 NOTE — Progress Notes (Signed)
Speech Language Pathology Discharge Summary  Patient Details  Name: Mark [name] MRN: [id number] Date of Birth: 07-27-1950  Today's Date: 09/04/2018 SLP Individual Time: 3474-2595 SLP Individual Time Calculation (min): 40 min   Skilled Therapeutic Interventions:  Skilled treatment session focused on communication goals. Upon arrival, patient was frustrated that he had therapy and reported, "I thought I was done with this BS." Patient answered basic yes/no questions in regards to biographical and environment information with 50% accuracy and was unable to follow 1-step commands that were not within context. Patient also required total A to name functional objects and for repetition at the word level. Patient participated in a basic conversation that focused on going home with Max A verbal cues. Patient left upright in bed with all needs within reach and alarm on. Continue with current plan of care.   Patient has met 7 of 7 long term goals.  Patient to discharge at overall Supervision level.   Reasons goals not met: N/A   Clinical Impression/Discharge Summary: Patient has made excellent gains and has met 7 of 7 LTGs this admission. Currently, patient is consuming regular textures with thin liquids with minimal overt s/s of aspiration and requires supervision level verbal cues for use of compensatory strategies. Patient demonstrates improved auditory comprehension of functional information, especially within a context with overall Mod-Max A verbal and visual cues needed. Patient also demonstrates improved verbal expression at the phrase and sentence level during an informal conversation with overall Mod A multimodal cues but requires Max-Total A for word-finding and verbal expression during structured tasks. Reading comprehension and written expression are also severely impaired. Patient has also demonstrated improved cognitive functioning with increased sustained attention, problem solving and  attention to left field of environment. Patient and family education is complete and patient will discharge home with 24 hour supervision from family. Patient would benefit from f/u skilled SLP intervention to maximize his cognitive-linguistic function in order to maximize independence and reduce caregiver burden.   Care Partner:  Caregiver Able to Provide Assistance: Yes  Type of Caregiver Assistance: Physical;Cognitive  Recommendation:  24 hour supervision/assistance;Home Health SLP  Rationale for SLP Follow Up: Maximize cognitive function and independence;Maximize functional communication;Reduce caregiver burden   Equipment: N/A   Reasons for discharge: Discharged from hospital;Treatment goals met   Patient/Family Agrees with Progress Made and Goals Achieved: Yes    Mariely Mahr 09/04/2018, 2:46 PM

## 2018-09-04 NOTE — Progress Notes (Signed)
Occupational Therapy Session Note  Patient Details  Name: Mark [name] MRN: [id number] Date of Birth: 03-Mar-1950  Today's Date: 09/04/2018 OT Individual Time: 1962-2297 OT Individual Time Calculation (min): 27 min    Short Term Goals: Week 1:  OT Short Term Goal 1 (Week 1): Pt will fasten B shoes wiht Supervision OT Short Term Goal 1 - Progress (Week 1): Met OT Short Term Goal 2 (Week 1): Pt will locate 3/3 items on R wiht min VC OT Short Term Goal 2 - Progress (Week 1): Met OT Short Term Goal 3 (Week 1): Pt will complete toilet transfer wiht Supervision and LRAD OT Short Term Goal 3 - Progress (Week 1): Progressing toward goal  Skilled Therapeutic Interventions/Progress Updates:    1:1. Pt received in bed asleep and grumpy about another tx session, but agreeable for tx after encouragement. Pt toilets with S overall for mobility and toileting components. Pt completes 1 round of each exercise from global HEP with demonstration cuing and up to min A for balance during step up exercise. Exited session with pt seated in bed, call light in reach and all needs met  Therapy Documentation Precautions:  Precautions Precautions: Fall, Other (comment) Precaution Comments: R inattention Restrictions Weight Bearing Restrictions: No General:   Vital Signs:   Pain:   ADL: ADL Eating: Supervision/safety Where Assessed-Eating: Edge of bed Grooming: Supervision/safety Where Assessed-Grooming: Sitting at sink Upper Body Bathing: Modified independent Where Assessed-Upper Body Bathing: Sitting at sink Lower Body Bathing: Supervision/safety Where Assessed-Lower Body Bathing: Standing at sink, Sitting at sink Upper Body Dressing: Supervision/safety Where Assessed-Upper Body Dressing: Sitting at sink, Standing at sink Lower Body Dressing: Supervision/safety Where Assessed-Lower Body Dressing: Standing at sink, Sitting at sink Toileting: Supervision/safety Where Assessed-Toileting:  Glass blower/designer: Close supervision Toilet Transfer Method: Counselling psychologist: Ambulance person Transfer: Close supervison Clinical cytogeneticist Method: Optometrist: Radio broadcast assistant Vision Baseline Vision/History: Wears glasses Wears Glasses: At all times Patient Visual Report: No change from baseline Alignment/Gaze Preference: Gaze left Perception  Perception: Impaired Inattention/Neglect: Does not attend to right visual field Praxis Praxis: Impaired Praxis Impairment Details: Initiation Exercises:   Other Treatments:     Therapy/Group: Individual Therapy  Tonny Branch 09/04/2018, 2:35 PM

## 2018-09-04 NOTE — Progress Notes (Signed)
Social Work Patient ID: Mark [name], male   DOB: December 01, 1950, 68 y.o.   MRN: [id number]   Conf reviewed with pt/ daughter.  Both feeling ready for d/c tomorrow.  Discussed d/c referrals made.  No further questions.  Randy Whitener, LCSW

## 2018-09-04 NOTE — Progress Notes (Signed)
Occupational Therapy Discharge Summary  Patient Details  Name: Wilman Tucker MRN: [id number] Date of Birth: 09/13/1950  Today's Date: 09/04/2018 OT Individual Time: 1045-1130 OT Individual Time Calculation (min): 45 min    Patient has met 13 of 13 long term goals due to improved activity tolerance, improved balance, postural control, ability to compensate for deficits, functional use of  RIGHT upper and RIGHT lower extremity, improved attention, improved awareness and improved coordination.  Patient to discharge at overall Supervision level.  Patient's care partner is independent to provide the necessary physical and cognitive assistance at discharge.    Reasons goals not met: All treatment goals met.  Recommendation:  Patient will benefit from ongoing skilled OT services in home health setting to continue to advance functional skills in the area of BADL.  Equipment: BSC and TTB  Reasons for discharge: treatment goals met and discharge from hospital  Patient/family agrees with progress made and goals achieved: Yes   Skilled OT intervention:  Pt received supine agreeable to therapy session. Pt completed functional mobility to sink with (S). He sat and completed full body bathing, requiring min cueing for task progression. Upon wiping bottom, pt had BM residue and was encouraged to attempt and go to the bathroom. Pt was able to void b/b and complete peri hygiene with (S). Pt returned to EOB. Pt completed item retrieval around room to pack clothes for d/c with close (S). A HEP was created for pt and printed out, as described below. Pt was left supine with all needs met, bed alarm set.   HEP Provided:  Access Code: [email or web address]  URL: [email or web address]  Date: 09/04/2018  Prepared by: Laverle Hobby   Exercises  Sit to Stand - 10 reps - 3 sets - 1x daily - 3x weekly  Standing Heel Raise - 10 reps - 3 sets - 1x daily - 3x weekly  Step Up - 10 reps - 3 sets - 1x daily - 3x  weekly  Mini Squat - 10 reps - 3 sets - 1x daily - 3x weekly  Child's Pose with Sidebending - 10 reps - 3 sets - 1x daily - 3x weekly  Wall Push Up - 10 reps - 3 sets - 1x daily - 3x weekly  Standing Hip Abduction with Counter Support - 10 reps - 3 sets - 1x daily - 3x weekly  Standing Shoulder Row with Anchored Resistance - 10 reps - 3 sets - 1x daily - 3x weekly  Standing Shoulder Horizontal Abduction with Resistance - 10 reps - 3 sets - 1x daily - 3x weekly  Shoulder PNF D2 with Resistance - 10 reps - 3 sets - 1x daily - 3x weekly  Standing Shoulder Diagonal Horizontal Abduction 60/120 Degrees with Resistance - 10 reps - 3 sets - 1x daily - 3x weekly    OT Discharge Precautions/Restrictions  Precautions Precautions: Fall;Other (comment) Precaution Comments: R inattention Restrictions Weight Bearing Restrictions: No   Pain Pain Assessment Pain Scale: 0-10 Pain Score: 0-No pain ADL ADL Eating: Supervision/safety Where Assessed-Eating: Edge of bed Grooming: Supervision/safety Where Assessed-Grooming: Sitting at sink Upper Body Bathing: Modified independent Where Assessed-Upper Body Bathing: Sitting at sink Lower Body Bathing: Supervision/safety Where Assessed-Lower Body Bathing: Standing at sink, Sitting at sink Upper Body Dressing: Supervision/safety Where Assessed-Upper Body Dressing: Sitting at sink, Standing at sink Lower Body Dressing: Supervision/safety Where Assessed-Lower Body Dressing: Standing at sink, Sitting at sink Toileting: Supervision/safety Where Assessed-Toileting: Glass blower/designer: Close supervision Toilet Transfer Method: Counselling psychologist:  Grab bars Tub/Shower Transfer: Close supervison Tub/Shower Transfer Method: Ambulating Tub/Shower Equipment: Radio broadcast assistant Vision Baseline Vision/History: Wears glasses Wears Glasses: At all times Patient Visual Report: No change from baseline Vision Assessment?: Yes Alignment/Gaze  Preference: Gaze left Visual Fields: Right visual field deficit Perception  Perception: Impaired Inattention/Neglect: Does not attend to right visual field Praxis Praxis: Impaired Praxis Impairment Details: Initiation Cognition Overall Cognitive Status: Impaired/Different from baseline Arousal/Alertness: Awake/alert Orientation Level: Oriented to person;Oriented to place(difficult to assess 2/2 aphasia) Attention: Selective Selective Attention: Appears intact Memory: Impaired Memory Impairment: Decreased short term memory Decreased Short Term Memory: Functional complex Awareness: Impaired Awareness Impairment: Emergent impairment Problem Solving: Impaired Problem Solving Impairment: Verbal basic;Functional basic Safety/Judgment: Impaired Comments: impulsive, decreased insight, right inattention Sensation Sensation Light Touch: Appears Intact Proprioception: Appears Intact Coordination Gross Motor Movements are Fluid and Coordinated: No Fine Motor Movements are Fluid and Coordinated: No Coordination and Movement Description: Impaired 2/2 Rt inattention and very mild hemi Heel Shin Test: mild dysmetria in the R Motor  Motor Motor: Abnormal postural alignment and control;Ataxia Motor - Discharge Observations: mild R sided ataxia and coordination deficits. Mobility  Bed Mobility Bed Mobility: Rolling Right;Rolling Left;Supine to Sit;Sit to Supine Rolling Right: Independent with assistive device Rolling Left: Independent with assistive device Supine to Sit: Independent with assistive device Sit to Supine: Independent with assistive device Transfers Sit to Stand: Supervision/Verbal cueing Stand to Sit: Supervision/Verbal cueing  Trunk/Postural Assessment  Cervical Assessment Cervical Assessment: Exceptions to WFL(forward head) Thoracic Assessment Thoracic Assessment: Exceptions to WFL(rounded shoulders) Lumbar Assessment Lumbar Assessment: Exceptions to WFL(posterior  pelvic tilt) Postural Control Postural Control: Deficits on evaluation Righting Reactions: delayed  Balance Balance Balance Assessed: Yes Static Sitting Balance Static Sitting - Balance Support: Feet supported;No upper extremity supported Static Sitting - Level of Assistance: 6: Modified independent (Device/Increase time) Dynamic Sitting Balance Dynamic Sitting - Balance Support: Feet supported;During functional activity Dynamic Sitting - Level of Assistance: 6: Modified independent (Device/Increase time) Static Standing Balance Static Standing - Balance Support: During functional activity;No upper extremity supported Static Standing - Level of Assistance: 5: Stand by assistance Dynamic Standing Balance Dynamic Standing - Balance Support: During functional activity;No upper extremity supported Dynamic Standing - Level of Assistance: 5: Stand by assistance Extremity/Trunk Assessment RUE Assessment RUE Assessment: Exceptions to [facility] General Strength Comments: Rt inattention that limits R UE coordination LUE Assessment LUE Assessment: Within Functional Limits   Curtis Sites 09/04/2018, 12:14 PM

## 2018-09-04 NOTE — Progress Notes (Signed)
[place] PHYSICAL MEDICINE & REHABILITATION PROGRESS NOTE   Subjective/Complaints: Patient seen laying in bed this morning.  He slept well overnight per sleep chart.  Patient n.p.o. overnight for PEG removal today.  ROS: limited due to cognition  Objective:   No results found. Recent Labs    09/02/18 0617  WBC 8.3  HGB 11.2*  HCT 35.1*  PLT 406*   Recent Labs    09/02/18 0617  NA 140  K 4.5  CL 105  CO2 26  GLUCOSE 116*  BUN 28*  CREATININE 1.27*  CALCIUM 9.6    Intake/Output Summary (Last 24 hours) at 09/04/2018 1313 Last data filed at 09/03/2018 2030 Gross per 24 hour  Intake 600 ml  Output -  Net 600 ml     Physical Exam: Vital Signs Blood pressure 124/65, pulse 67, temperature 98 F (36.7 C), resp. rate 20, height 5\' 6"  (1.676 m), weight 55.2 kg, SpO2 99 %. Constitutional: No distress . Vital signs reviewed. HENT: Normocephalic.  Atraumatic. Eyes: EOMI.  No discharge. Cardiovascular: No JVD. Respiratory: Normal effort. GI: Non-distended.  + PEG Musc: No edema or tenderness in extremities. Neurological: He is alert Global aphasia, unchanged Motor: Limited by participation, however moving all 4 extremities spontaneously, unchanged Skin: bruises, lacs on arms Psychiatric: Unable to assess due to cognition  Assessment/Plan: 1. 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  Physiatrist is providing close team supervision and 24 hour management of active medical problems listed below.  Physiatrist and rehab team continue to assess barriers to discharge/monitor patient progress toward functional and medical goals  Care Tool:  Bathing    Body parts bathed by patient: Right arm, Left arm, Chest, Abdomen, Front perineal area, Buttocks, Right upper leg, Left upper leg, Right lower leg, Left lower leg, Face         Bathing assist Assist Level:  Supervision/Verbal cueing     Upper Body Dressing/Undressing Upper body dressing   What is the patient wearing?: Pull over shirt    Upper body assist Assist Level: Supervision/Verbal cueing    Lower Body Dressing/Undressing Lower body dressing      What is the patient wearing?: Pants     Lower body assist Assist for lower body dressing: Supervision/Verbal cueing     Toileting Toileting    Toileting assist Assist for toileting: Supervision/Verbal cueing     Transfers Chair/bed transfer  Transfers assist  Chair/bed transfer activity did not occur: N/A  Chair/bed transfer assist level: Supervision/Verbal cueing     Locomotion Ambulation   Ambulation assist      Assist level: Contact Guard/Touching assist Assistive device: No Device Max distance: 119ft   Walk 10 feet activity   Assist  Walk 10 feet activity did not occur: Safety/medical concerns(dynanuc balance deficits)  Assist level: Contact Guard/Touching assist Assistive device: No Device   Walk 50 feet activity   Assist Walk 50 feet with 2 turns activity did not occur: Safety/medical concerns(dynamic balance deficits)  Assist level: Contact Guard/Touching assist Assistive device: No Device    Walk 150 feet activity   Assist Walk 150 feet activity did not occur: Safety/medical concerns(dynamic balance deficits)  Assist level: Contact Guard/Touching assist Assistive device: No Device    Walk 10 feet on uneven surface  activity   Assist Walk 10 feet on uneven surfaces activity did not occur: Safety/medical concerns(dynamic balance deficits)   Assist level: Minimal Assistance -  Patient > 75% Assistive device: Hand held assist   Wheelchair     Assist Will patient use wheelchair at discharge?: No Type of Wheelchair: Manual    Wheelchair assist level: Moderate Assistance - Patient 50 - 74% Max wheelchair distance: 150 ft    Wheelchair 50 feet with 2 turns  activity    Assist    Wheelchair 50 feet with 2 turns activity did not occur: Safety/medical concerns(unable to motor plan)   Assist Level: Moderate Assistance - Patient 50 - 74%   Wheelchair 150 feet activity     Assist Wheelchair 150 feet activity did not occur: Safety/medical concerns(unable to motor plan)   Assist Level: Moderate Assistance - Patient 50 - 74%    Medical Problem List and Plan: 1.  Deficits with mobility, self-care, language, nutrition, cognition, swallowing secondary to left temporal ICH   Continue CIR 2.  Antithrombotics: -DVT/anticoagulation:  Mechanical: Sequential compression devices, below knee Bilateral lower extremities              Lower extremity Dopplers negative  -antiplatelet therapy: N/A 3. Pain Management: Oxycodone prn.  4. Mood: LCSW to follow for evaluation and support when appropriate. Continue Zoloft and Klonopin for mood stabilization.              -antipsychotic agents:   Added low dose seroquel for hs agitation/insomnia 5. Neuropsych: This patient is not capable of making decisions on his own behalf.    6. Skin/Wound Care: local care to skin 7. Fluids/Electrolytes/Nutrition:    -stopped HS TF   -diet advanced to regular thins  PEG discontinued- with the use of gauze and consistent traction, PEG tube was removed today with any difficulty.  Patient tolerated the procedure well.  Discussed dressing changes with nurses.  Patient may resume diet at lunch.  Continue to monitor site  Encourage fluids 8.  HTN: Monitor BP tid- continue amlodipine, hydralazine and lisinopril.    -increased lisinopril to 40mg  daily.   Controlled on 7/8 9. T2DM: Diet controlled-Hgb A1c- 5.5 at admission.    -lantus insulin and SSI d/c'ed  Trending up 7/8 10. ABLA: Monitor for signs of bleeding.   Hemoglobin 11.2 on 7/6  Continue to monitor 11. CKD stage III:   Creatinine 1.27 on 7/6  Encourage fluids 12. Pulmonary: pt self-decannulated .   -stoma  closed, no issues.    LOS: 14 days A FACE TO FACE EVALUATION WAS PERFORMED  Mark [name] 09/04/2018, 1:13 PM

## 2018-09-04 NOTE — Progress Notes (Signed)
Physical Therapy Discharge Summary  Patient Details  Name: Mark [name] MRN: [id number] Date of Birth: [birth year]  Today's Date: 09/04/2018 PT Individual Time: 0807-0900 PT Individual Time Calculation (min): 53 min    Patient has met 13 of 13 long term goals due to improved activity tolerance, improved balance, improved postural control, increased range of motion, ability to compensate for deficits, functional use of  right upper extremity, improved awareness and improved coordination.  Patient to discharge at an ambulatory level Supervision.   Patient's care partner is independent to provide the necessary physical and cognitive assistance at discharge.  Reasons goals not met: All PT goals met   Recommendation:  Patient will benefit from ongoing skilled PT services in home health setting to continue to advance safe functional mobility, address ongoing impairments in balance, safety, perception, coordinaiton, and minimize fall risk.  Equipment: No equipment provided  Reasons for discharge: treatment goals met and discharge from hospital  Patient/family agrees with progress made and goals achieved: Yes   PT treatment:  Pt received supine in bed and agreeable to PT. Following MD removing Peg tube. Supine>sit transfer with supervision assist and min cues for safety. Ambulatory transfer to bathroom to urinate with supervision-CGA to prevent R LOB. PT instructed pt in Grad day assessment to measure progress toward goals. See below for detailsGait training with no AD and max cues for awareness of the R side to prevent hitting walls, counter and rail at steps. . PT instructed pt in Modified [place] Level A balance program with hand out provided; max multimodal cues for safety and technique to improve neuromotor control. Pt returned to room and performed ambulatory transfer to bed with supervision A. Sit>supine completed with supervision assist, and left supine in bed with call bell in reach and  all needs met.      PT Discharge Precautions/Restrictions   Fall. R inattention   Vital Signs Therapy Vitals Temp: 98 F (36.7 C) Pulse Rate: 67 Resp: 20 BP: 124/65 Patient Position (if appropriate): Lying Oxygen Therapy SpO2: 99 % O2 Device: Room Air Pain Pain Assessment Pain Scale: 0-10 Pain Score: 0-No pain Vision/Perception  Vision - Assessment Alignment/Gaze Preference: Gaze left Perception Perception: Impaired Praxis Praxis Impairment Details: Ideomotor  Cognition Overall Cognitive Status: Impaired/Different from baseline Arousal/Alertness: Awake/alert Orientation Level: Oriented to person;Oriented to place(difficult to assess 2/2 aphasia) Attention: Selective Sustained Attention: Appears intact Selective Attention: Appears intact Memory: Impaired Memory Impairment: Decreased short term memory Decreased Short Term Memory: Functional complex Awareness: Impaired Awareness Impairment: Emergent impairment Problem Solving: Impaired Problem Solving Impairment: Verbal basic;Functional basic Safety/Judgment: Impaired Comments: impulsive, decreased insight, right inattention Sensation Sensation Light Touch: Appears Intact Proprioception: Appears Intact Coordination Gross Motor Movements are Fluid and Coordinated: No Heel Shin Test: mild dysmetria in the R Motor  Motor Motor: Abnormal postural alignment and control;Ataxia Motor - Discharge Observations: mild R sided ataxia and coordination deficits.  Mobility Bed Mobility Bed Mobility: Rolling Right;Rolling Left;Supine to Sit;Sit to Supine Rolling Right: Independent with assistive device Rolling Left: Independent with assistive device Supine to Sit: Independent with assistive device Sit to Supine: Independent with assistive device Transfers Transfers: Sit to Stand;Stand Pivot Transfers Sit to Stand: Supervision/Verbal cueing Stand to Sit: Supervision/Verbal cueing Stand Pivot Transfers:  Supervision/Verbal cueing Transfer (Assistive device): None  Car transfer: supervision assist no UE support  Locomotion  Gait Ambulation: Yes Gait Assistance: Supervision/Verbal cueing Gait Distance (Feet): 200 Feet Assistive device: None Gait Assistance Details: Verbal cues for precautions/safety;Verbal cues for gait pattern Gait  Gait: Yes Gait Pattern: Impaired Gait Pattern: Ataxic;Narrow base of support;Lateral trunk lean to right Stairs / Additional Locomotion Stairs: Yes Stairs Assistance: Supervision/Verbal cueing Stair Management Technique: One rail Left Number of Stairs: 4 Height of Stairs: 6 Curb: Supervision/Verbal cueing(wiith rails) Wheelchair Mobility Wheelchair Mobility: No  Trunk/Postural Assessment  Cervical Assessment Cervical Assessment: Exceptions to WFL(forward head) Thoracic Assessment Thoracic Assessment: Exceptions to WFL(rounded shoulders) Lumbar Assessment Lumbar Assessment: Exceptions to WFL(sits in posterior pelvic tilt) Postural Control Postural Control: Deficits on evaluation(delayed righting reactions)  Balance Balance Balance Assessed: Yes Static Sitting Balance Static Sitting - Balance Support: Feet supported;No upper extremity supported Static Sitting - Level of Assistance: 6: Modified independent (Device/Increase time) Dynamic Sitting Balance Dynamic Sitting - Balance Support: Feet supported;During functional activity Dynamic Sitting - Level of Assistance: 6: Modified independent (Device/Increase time) Static Standing Balance Static Standing - Balance Support: During functional activity;No upper extremity supported Static Standing - Level of Assistance: 5: Stand by assistance Dynamic Standing Balance Dynamic Standing - Balance Support: During functional activity;No upper extremity supported Dynamic Standing - Level of Assistance: 5: Stand by assistance Extremity Assessment      RLE Assessment RLE Assessment: Within Functional  Limits General Strength Comments: groslly 4+/5 to 5/5 LLE Assessment LLE Assessment: Within Functional Limits    Lorie Phenix 09/04/2018, 9:05 AM

## 2018-09-04 NOTE — Patient Care Conference (Signed)
Inpatient RehabilitationTeam Conference and Plan of Care Update Date: 09/03/2018   Time: 10:40 AM    Patient Name: Mark [name]      Medical Record Number: [id number]  Date of Birth: 11-21-1950 Sex: Male         Room/Bed: 4W11C/4W11C-01 Payor Info: Payor: [employer] MEDICARE / Plan: [employer] MEDICARE HMO/PPO / Product Type: *No Product type* /    Admitting Diagnosis: 2. TBI Team  LT. CVA, Nontraumatic ICH, 24-27 days  Admit Date/Time:  08/21/2018  3:26 PM Admission Comments: No comment available   Primary Diagnosis:  <principal problem not specified> Principal Problem: <principal problem not specified>  Patient Active Problem List   Diagnosis Date Noted  . S/P percutaneous endoscopic gastrostomy (PEG) tube placement (HCC)   . 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 CKD (chronic kidney disease), stage III (HCC)   . Acute blood loss anemia   . Diabetes mellitus type 2 in nonobese (HCC)   . Essential hypertension   . Global aphasia   . Acute on chronic respiratory failure with hypoxia (HCC)   . Chronic kidney disease, stage III (moderate) (HCC)   . Nontraumatic intracranial hemorrhage, unspecified (HCC)   . Altered mental status, unspecified   . S/P AAA repair 02/01/2018    Expected Discharge Date: Expected Discharge Date: 09/05/18  Team Members Present: Physician leading conference: Dr. Maryla Morrow Social Worker Present: Amada Jupiter, LCSW Nurse Present: Kennyth Arnold, RN PT Present: Tyrell Antonio, PT OT Present: Jake Shark, OT SLP Present: Feliberto Gottron, SLP PPS Coordinator present : Fae Pippin, SLP     Current Status/Progress Goal Weekly Team Focus  Medical   Deficits with mobility, self-care, language, nutrition, cognition, swallowing secondary to left temporal ICH  Improve mobility, cognition, self-care, DM/HTN  See above   Bowel/Bladder   Patient incontinent of bowel and bladder LBM 09/02/18  Manage bowel and bladder moderate assisit  assess patient  tolieting needs qshift/prn   Swallow/Nutrition/ Hydration   Regular textures with thin liquids, Supervision  Mod A  family Education   ADL's   Moderate cueing for Rt attention/scanning, CGA transfers, (S) UB ADLS, CGA LB ADLs  Supervision ADLs and transfers  Rt attention/scanning, d/c planning, family education, dynamic standing balance, LB dressing   Mobility   supervision bed mobility, CGA/supervision transfers, CGA ambulation >276ft without AD  supervision overall  transfers, gait, R attention, endurance, stair navigation, family training, standing balance   Communication   Max A  Max A  Family Education   Safety/Cognition/ Behavioral Observations  Mod A  Mod A  family Education   Pain   no complaint of pain  0-2/10  assess pain qshift/prn   Skin   peg tube LUQ area dry and intact. Guaze covering trach stoma, craniectomy scar over left frontal, skin tear to rt hand  skin remain free of infection and breakdown       Rehab Goals Patient on target to meet rehab goals: Yes *See Care Plan and progress notes for long and short-term goals.     Barriers to Discharge  Current Status/Progress Possible Resolutions Date Resolved   Physician    Medical stability;Other (comments)  PEG  See above  Therapies, follow CBGs/Vitals, D/c PEG      Nursing                  PT                    OT  SLP                SW                Discharge Planning/Teaching Needs:  Pt to return to his own home with adult (2) children alternating staying with pt and providing 24/7 assistance.  Teaching completed today   Team Discussion:  Medically stable;  Plan for peg removal tomorrow.  Can be continent, but occasional incont at hs.  Need to encourage fluids.  Min/ mod cues to attend.  Supervision ot CGA with ADLs. Same with mobility.  Now on reg diet, thin.  Family ed being completed this morning.  Revisions to Treatment Plan:  None    Continued Need for Acute Rehabilitation  Level of Care: The patient requires daily medical management by a physician with specialized training in physical medicine and rehabilitation for the following conditions: Daily direction of a multidisciplinary physical rehabilitation program to ensure safe treatment while eliciting the highest outcome that is of practical value to the patient.: Yes Daily medical management of patient stability for increased activity during participation in an intensive rehabilitation regime.: Yes Daily analysis of laboratory values and/or radiology reports with any subsequent need for medication adjustment of medical intervention for : Neurological problems   I attest that I was present, lead the team conference, and concur with the assessment and plan of the team.   Lennart Pall 09/04/2018, 3:28 PM    Team conference was held via web/ teleconference due to [place] - 19

## 2018-09-05 DIAGNOSIS — I611 Nontraumatic intracerebral hemorrhage in hemisphere, cortical: Secondary | ICD-10-CM | POA: Diagnosis not present

## 2018-09-05 LAB — GLUCOSE, CAPILLARY
Glucose-Capillary: 104 mg/dL — ABNORMAL HIGH (ref 70–99)
Glucose-Capillary: 101 mg/dL — ABNORMAL HIGH (ref 70–99)

## 2018-09-05 MED ORDER — SERTRALINE HCL 50 MG PO TABS: 50.0000 mg | ORAL_TABLET | Freq: Every day | ORAL | 1 refills | Status: AC

## 2018-09-05 MED ORDER — AMANTADINE HCL 100 MG PO CAPS: 100.0000 mg | ORAL_CAPSULE | Freq: Every day | ORAL | 1 refills | Status: AC

## 2018-09-05 MED ORDER — THIAMINE HCL 100 MG PO TABS: 100.0000 mg | ORAL_TABLET | Freq: Every day | ORAL | 1 refills | Status: AC

## 2018-09-05 MED ORDER — LEVETIRACETAM 500 MG PO TABS: 500.0000 mg | ORAL_TABLET | Freq: Two times a day (BID) | ORAL | 1 refills | Status: AC

## 2018-09-05 MED ORDER — HYDRALAZINE HCL 100 MG PO TABS: 100.0000 mg | ORAL_TABLET | Freq: Three times a day (TID) | ORAL | 1 refills | Status: AC

## 2018-09-05 MED ORDER — AMLODIPINE BESYLATE 10 MG PO TABS: 10.0000 mg | ORAL_TABLET | Freq: Every day | ORAL | 1 refills | Status: AC

## 2018-09-05 MED ORDER — FAMOTIDINE 20 MG PO TABS: 20.0000 mg | ORAL_TABLET | Freq: Two times a day (BID) | ORAL | 1 refills | Status: AC

## 2018-09-05 MED ORDER — THIAMINE HCL 100 MG PO TABS: 100.0000 mg | ORAL_TABLET | Freq: Every day | ORAL | 1 refills | Status: DC

## 2018-09-05 MED ORDER — LISINOPRIL 40 MG PO TABS: 40.0000 mg | ORAL_TABLET | Freq: Every day | ORAL | 0 refills | Status: AC

## 2018-09-05 MED ORDER — CLONAZEPAM 0.5 MG PO TABS: 0.2500 mg | ORAL_TABLET | Freq: Two times a day (BID) | ORAL | 0 refills | Status: AC

## 2018-09-05 MED ORDER — POLYETHYLENE GLYCOL 3350 17 G PO PACK: 17.0000 g | PACK | Freq: Every day | ORAL | 0 refills | Status: AC

## 2018-09-05 NOTE — Progress Notes (Signed)
Social Work Discharge Note   The overall goal for the admission was met for:   Discharge location: Yes - pt to d/c home with son and daughter to share in providing 24/7 supervision  Length of Stay: Yes - 15 days  Discharge activity level: Yes - supervision overall  Home/community participation: Yes  Services provided included: MD, RD, PT, OT, SLP, RN, TR, Pharmacy, Neuropsych and SW  Financial Services: Medicare  Follow-up services arranged: Home Health: PT, OT, ST via [facility], DME: 3n1 commode and tub bench via [facility] and Patient/Family has no preference for HH/DME agencies  Comments (or additional information):     Contact info:  Daughter, Mark [name] @ [phone]  PLEASE NOTE THAT PT [place]   Patient/Family verbalized understanding of follow-up arrangements: Yes  Individual responsible for coordination of the follow-up plan: daughter  Confirmed correct DME delivered: Lennart Pall 09/05/2018    Mark [name]

## 2018-09-05 NOTE — Progress Notes (Signed)
Patient slept for 6 hours without incidence. No prn medications given. No complaints verbalized. Pt to be discharged today.

## 2018-09-05 NOTE — Discharge Instructions (Signed)
Inpatient Rehab Discharge Instructions  Artice Bergerson IV Discharge date and time:  09/05/18  Activities/Precautions/ Functional Status: Activity: no lifting, driving, or strenuous exercise till cleared by MD.  Diet: Heart healthy.  Wound Care: keep wound clean and dry. Contact MD if you develop any problems with your incision/wound--redness, swelling, increase in pain, drainage or if you develop fever or chills.     Functional status:  ___ No restrictions     ___ Walk up steps independently _X__ 24/7 supervision/assistance   ___ Walk up steps with assistance ___ Intermittent supervision/assistance  ___ Bathe/dress independently ___ Walk with walker     _X__ Bathe/dress with assistance ___ Walk Independently    ___ Shower independently ___ Walk with assistance    ___ Shower with assistance _X__ No alcohol     ___ Return to work/school ________    COMMUNITY REFERRALS UPON DISCHARGE:    Home Health:   PT    OT     ST                      Agency:  [facility]     Phone: [phone]   Medical Equipment/Items Ordered:  Bedside commode and tub bench                                                      Agency/Supplier:  [facility] @ [phone] Special Instructions: 1. Family to assist with medication management.  2. No alcohol, aspirin/ibuprofen/aleve--can use tylenol as needed for pain.    FOR SAFETY, 24 HOUR SUPERVISION IS RECOMMENDED UNTIL OTHERWISE DIRECTED BY A PHYSICIAN. ABSOLUTELY NO DRIVING UNTIL OTHERWISE DIRECTED BY A PHYSICIAN. CAR KEYS SHOULD BE HIDDEN IN A SAFE LOCATION IF NEEDED. DUE TO RISK OF INJURY, PLEASE REMOVE GUNS, KNIVES, OR ANY OTHER POTENTIALLY DANGEROUS OBJECTS AND HAZARDOUS MATERIALS FROM THE HOME.    My questions have been answered and I understand these instructions. I will adhere to these goals and the provided educational materials after my discharge from the hospital.  Patient/Caregiver Signature _______________________________ Date  __________  Clinician Signature _______________________________________ Date __________  Please bring this form and your medication list with you to all your follow-up doctor's appointments.

## 2018-09-05 NOTE — Discharge Summary (Signed)
Physician Discharge Summary  Patient ID: Mark [name] MRN: [id number] DOB/AGE: 70/16/1952 70 y.o.  Admit date: 08/21/2018 Discharge date: 09/05/2018  Discharge Diagnoses:  Principal Problem:   Left temporal lobe hemorrhage ([place]) Active Problems:   CKD (chronic kidney disease), stage III (HCC)   Acute blood loss anemia   Diabetes mellitus type 2 in nonobese [facility])   Essential hypertension   Global aphasia   S/P percutaneous endoscopic gastrostomy (PEG) tube placement [facility])   Discharged Condition: good  Significant Diagnostic Studies: Dg Swallowing Func-speech Pathology  Result Date: 08/23/2018 Objective Swallowing Evaluation: Type of Study: Bedside Swallow Evaluation  Patient Details Name: Mark [name] MRN: [id number] Date of Birth: [birth year] Today's Date: 08/23/2018 Time: No data recorded-No data recorded No data recorded Past Medical History: Past Medical History: Diagnosis Date . Acute on chronic respiratory failure with hypoxia ([place])  . Altered mental status, unspecified  . Chronic kidney disease   CKD stage 3 . Chronic kidney disease, stage III (moderate) (HCC)  . Concussion   multiple in high school . Coronary artery disease   pt denies . Diabetes mellitus without complication (HCC)   borderline . Hypertension  . Nontraumatic intracranial hemorrhage, unspecified ([place])  . PVD (peripheral vascular disease) (HCC)   leg stents femoral artery . Stroke [facility])   3 years ago- no residual Past Surgical History: Past Surgical History: Procedure Laterality Date . ABDOMINAL AORTIC ENDOVASCULAR STENT GRAFT N/A 02/01/2018  Procedure: ABDOMINAL AORTIC ENDOVASCULAR STENT GRAFT;  Surgeon: Serafina Mitchell, MD;  Location: [place];  Service: Vascular;  Laterality: N/A; . NECK SURGERY   . VEIN BYPASS SURGERY   No data recorded No data recorded Assessment / Plan / Recommendation CHL IP CLINICAL IMPRESSIONS 08/23/2018 Clinical Impression Pt presents with primary oral phase dysphagia that is c/b decreased  bolus cohesion, difficulty with posterior propolsion resulting in lengthy oral phase and  occasional episodes of premature spillage and piecemeal spillage. However pt is able to decreased oral deficits when consuming thin liquids via straw. Despite oral phase deficits, pt with timely swallow initiation and complete airway protection throughout study. Pt with with very trace pharyngela resiude that didn't pose any risk of aspiration during study. Recommend regular diet with thin liquids (via straw to prevent oral delays), medicine crushed in puree and full supervision. SLP Visit Diagnosis Dysphagia, oral phase (R13.11) Attention and concentration deficit following -- Frontal lobe and executive function deficit following -- Impact on safety and function No limitations   CHL IP TREATMENT RECOMMENDATION 08/23/2018 Treatment Recommendations Therapy as outlined in treatment plan below   No flowsheet data found. CHL IP DIET RECOMMENDATION 08/23/2018 SLP Diet Recommendations Regular solids;Thin liquid Liquid Administration via Straw Medication Administration Crushed with puree Compensations Minimize environmental distractions;Slow rate;Small sips/bites Postural Changes Seated upright at 90 degrees   CHL IP OTHER RECOMMENDATIONS 08/23/2018 Recommended Consults -- Oral Care Recommendations Oral care BID Other Recommendations --   No flowsheet data found.  No flowsheet data found.     CHL IP ORAL PHASE 08/23/2018 Oral Phase Impaired Oral - Pudding Teaspoon -- Oral - Pudding Cup -- Oral - Honey Teaspoon -- Oral - Honey Cup -- Oral - Nectar Teaspoon Lingual pumping;Weak lingual manipulation;Holding of bolus;Reduced posterior propulsion;Delayed oral transit;Decreased bolus cohesion;Premature spillage;Piecemeal swallowing Oral - Nectar Cup Weak lingual manipulation;Lingual pumping;Reduced posterior propulsion;Holding of bolus;Piecemeal swallowing;Delayed oral transit;Decreased bolus cohesion;Premature spillage Oral - Nectar Straw --  Oral - Thin Teaspoon Weak lingual manipulation;Lingual pumping;Reduced posterior propulsion;Holding of bolus;Piecemeal swallowing;Delayed oral transit;Decreased bolus cohesion;Premature spillage  Oral - Thin Cup Weak lingual manipulation;Lingual pumping;Reduced posterior propulsion;Holding of bolus;Piecemeal swallowing;Delayed oral transit;Decreased bolus cohesion;Premature spillage Oral - Thin Straw Premature spillage Oral - Puree -- Oral - Mech Soft Weak lingual manipulation;Lingual pumping;Reduced posterior propulsion;Holding of bolus;Decreased bolus cohesion Oral - Regular -- Oral - Multi-Consistency Impaired mastication;Weak lingual manipulation;Lingual pumping;Reduced posterior propulsion;Holding of bolus;Piecemeal swallowing;Delayed oral transit;Decreased bolus cohesion;Premature spillage Oral - Pill Holding of bolus;Lingual pumping;Decreased bolus cohesion;Delayed oral transit Oral Phase - Comment --  CHL IP PHARYNGEAL PHASE 08/23/2018 Pharyngeal Phase Impaired Pharyngeal- Pudding Teaspoon -- Pharyngeal -- Pharyngeal- Pudding Cup -- Pharyngeal -- Pharyngeal- Honey Teaspoon -- Pharyngeal -- Pharyngeal- Honey Cup -- Pharyngeal -- Pharyngeal- Nectar Teaspoon Pharyngeal residue - pyriform;Pharyngeal residue - valleculae Pharyngeal -- Pharyngeal- Nectar Cup Pharyngeal residue - valleculae;Pharyngeal residue - pyriform Pharyngeal -- Pharyngeal- Nectar Straw -- Pharyngeal -- Pharyngeal- Thin Teaspoon Pharyngeal residue - valleculae;Pharyngeal residue - pyriform Pharyngeal -- Pharyngeal- Thin Cup Pharyngeal residue - valleculae;Pharyngeal residue - pyriform;Penetration/Aspiration during swallow Pharyngeal Material enters airway, remains ABOVE vocal cords then ejected out Pharyngeal- Thin Straw -- Pharyngeal -- Pharyngeal- Puree Delayed swallow initiation-vallecula Pharyngeal -- Pharyngeal- Mechanical Soft WFL Pharyngeal -- Pharyngeal- Regular -- Pharyngeal -- Pharyngeal- Multi-consistency WFL;Delayed swallow  initiation-vallecula Pharyngeal -- Pharyngeal- Pill WFL Pharyngeal -- Pharyngeal Comment --  CHL IP CERVICAL ESOPHAGEAL PHASE 08/23/2018 Cervical Esophageal Phase WFL Pudding Teaspoon -- Pudding Cup -- Honey Teaspoon -- Honey Cup -- Nectar Teaspoon -- Nectar Cup -- Nectar Straw -- Thin Teaspoon -- Thin Cup -- Thin Straw -- Puree -- Mechanical Soft -- Regular -- Multi-consistency -- Pill -- Cervical Esophageal Comment -- Happi Overton 08/23/2018, 3:23 PM              Vas [place] Lower Extremity Venous (dvt)  Result Date: 08/22/2018  Lower Venous Study Indications: Edema.  Risk Factors: None identified. Limitations: Patient positioning. Comparison Study: No prior studies. Performing Technologist: Oliver Hum RVT  Examination Guidelines: A complete evaluation includes B-mode imaging, spectral Doppler, color Doppler, and power Doppler as needed of all accessible portions of each vessel. Bilateral testing is considered an integral part of a complete examination. Limited examinations for reoccurring indications may be performed as noted.  +---------+---------------+---------+-----------+----------+-------+ RIGHT    CompressibilityPhasicitySpontaneityPropertiesSummary +---------+---------------+---------+-----------+----------+-------+ CFV      Full           Yes      Yes                          +---------+---------------+---------+-----------+----------+-------+ SFJ      Full                                                 +---------+---------------+---------+-----------+----------+-------+ FV Prox  Full                                                 +---------+---------------+---------+-----------+----------+-------+ FV Mid   Full                                                 +---------+---------------+---------+-----------+----------+-------+ FV DistalFull                                                 +---------+---------------+---------+-----------+----------+-------+  PFV      Full                                                 +---------+---------------+---------+-----------+----------+-------+ POP      Full           Yes      Yes                          +---------+---------------+---------+-----------+----------+-------+ PTV      Full                                                 +---------+---------------+---------+-----------+----------+-------+ PERO     Full                                                 +---------+---------------+---------+-----------+----------+-------+   +---------+---------------+---------+-----------+----------+-------+ LEFT     CompressibilityPhasicitySpontaneityPropertiesSummary +---------+---------------+---------+-----------+----------+-------+ CFV      Full           Yes      Yes                          +---------+---------------+---------+-----------+----------+-------+ SFJ      Full                                                 +---------+---------------+---------+-----------+----------+-------+ FV Prox  Full                                                 +---------+---------------+---------+-----------+----------+-------+ FV Mid   Full                                                 +---------+---------------+---------+-----------+----------+-------+ FV DistalFull                                                 +---------+---------------+---------+-----------+----------+-------+ PFV      Full                                                 +---------+---------------+---------+-----------+----------+-------+ POP      Full           Yes      Yes                          +---------+---------------+---------+-----------+----------+-------+ PTV  Full                                                 +---------+---------------+---------+-----------+----------+-------+ PERO     Full                                                  +---------+---------------+---------+-----------+----------+-------+     Summary: Right: There is no evidence of deep vein thrombosis in the lower extremity. No cystic structure found in the popliteal fossa. Left: There is no evidence of deep vein thrombosis in the lower extremity. No cystic structure found in the popliteal fossa.  *See table(s) above for measurements and observations. Electronically signed by Servando Snare MD on 08/22/2018 at 1:12:23 PM.    Final     Labs:  Basic Metabolic Panel: BMP Latest Ref Rng & Units 09/02/2018 08/30/2018 08/26/2018  Glucose 70 - 99 mg/dL 116(H) 119(H) 114(H)  BUN 8 - 23 mg/dL 28(H) 29(H) 22  Creatinine 0.61 - 1.24 mg/dL 1.27(H) 1.22 1.09  Sodium 135 - 145 mmol/L 140 138 137  Potassium 3.5 - 5.1 mmol/L 4.5 4.3 4.3  Chloride 98 - 111 mmol/L 105 105 103  CO2 22 - 32 mmol/L '26 25 25  '$ Calcium 8.9 - 10.3 mg/dL 9.6 9.6 9.4    CBC: CBC Latest Ref Rng & Units 09/02/2018 08/30/2018 08/26/2018  WBC 4.0 - 10.5 K/uL 8.3 8.0 9.2  Hemoglobin 13.0 - 17.0 g/dL 11.2(L) 11.2(L) 10.4(L)  Hematocrit 39.0 - 52.0 % 35.1(L) 35.5(L) 32.5(L)  Platelets 150 - 400 K/uL 406(H) 399 365    CBG: Recent Labs  Lab 09/04/18 1155 09/04/18 1726 09/04/18 2127 09/05/18 0620 09/05/18 1121  GLUCAP 170* 99 132* 104* 101*    Brief HPI:   Jef Futch 32 is a 20-year-old male with history of HTN, AAA, ASCVD, CVA with diagnosis of left temporal AVM was admitted to [facility] on 07/05/2018 with right-sided weakness due to left temporal hemorrhage related to AVM rupture.  He underwent left craniectomy for evacuation of hematoma after partial embolization of AVM.  He was taken back to the OR on 521/2020 for resection of residual AVM with cranioplasty and PEG placement.  Trach placed on 07/20/2018 due to difficulty with vent wean with bouts of agitation as well as difficulty managing secretions.  He was reported to have issues with somnolence, right hemiparesis as well as aphasia.     He was transferred to [facility] on 08/01/18 for medical management and vent wean. Agitation has improved with use of Klonopin and he tolerated vent wean with plugging.  He was seizure-free on Keppra.  Provigil and amantadine were added to help with attention activation.  Activation.  He was kept n.p.o. with tube feeds for nutritional support due to signs of dysphagia.  He was showing improvement in activity tolerance with ability to follow some commands.  Therapy was ongoing and CIR was recommended by rehab team.  Chart reviewed by Rehab CM and Rehab MD and patient was felt to be a good CIR candidate.   Hospital Course: Draylen Lobue [name] was admitted to rehab 08/21/2018 for inpatient therapies to consist of PT, ST and OT at least three hours five days a week. Past admission physiatrist,  therapy team and rehab RN have worked together to provide customized collaborative inpatient rehab. Bilateral lower dopplers were done for surveillance and were negative for DVTs.  Mood has been stable on Zoloft and Klonopin.  Low-dose Seroquel was added to help manage agitation and help with insomnia.  MBS done for objective swallow evaluation after discharge and his diet has been advanced to regular textures.  Tube feeds were discontinued as p.o. intake was noted to be good.  Blood pressures have been monitored on 3 times daily basis and is showing reasonable control.  Diabetes has been monitored with ac/hs CBG checks.   Lantus and SSI  were discontinued once tube feeds were stopped.  His blood sugars continue to be monitored on ac/hs CBG checks and have been controlled on diet alone. His PEG was removed without difficulty on 7/8.  Acute blood loss anemia has been monitored with serial CBCs and is gradually improving.  Electrolytes/be met has been monitored with serial check and renal status has improved overall.  Respiratory status has been stable.  He continues to have some hyper granulation tissue with minimal drainage at  prior trach site and dry dressing to continue on this daily.  His mood has been stable and he has made steady progress during his rehab course.  He is currently requires supervision with activity and min to mod assist with cognitive tasks.  He will continue to receive further follow-up home health PT, OT and speech therapy by [facility] health after discharge.   Rehab course: During patient's stay in rehab weekly team conferences were held to monitor patient's progress, set goals and discuss barriers to discharge. At admission, patient required mod assist with mobility and min assist with basic self-care tasks.  He exhibited severe Wernicke's aphasia affecting all modalities of language and was largely unaware of errors.  His oral motor swallow function appeared functional at bedside and MBS was ordered for objective swallow evaluation.He  has had improvement in activity tolerance, balance, postural control as well as ability to compensate for deficits. He has had improvement in functional use RUE  and RLE as well as improvement in awareness. He is able to complete ADL tasks with supervision.  He requires supervision for transfers and is able to ambulate 200' without AD.  He is tolerating regular textures with minimal overt S/S of aspiration.  He requires supervision with verbal cues to utilize compensatory strategies.  He is showing improvement in auditory comprehension of functional tasks with mod to max verbal and visual cues.  His verbal output has improved he requires mod multimodal cues for expression and phrase and sentence level and requires max to total assist for word finding and verbal expression during structured tasks.  His reading comprehension and written expression continues to be severely impaired.  Hands-on family education was completed regarding all aspects of safety, care and mobility.    Disposition:  Home  Diet: Heart Healthy  Special Instructions: 1. Needs activity including  medication management. 2.  No driving, alcohol or smoking till cleared by MD  Discharge Instructions    Ambulatory referral to Physical Medicine Rehab   Complete by: As directed    1-2 weeks transitional care appt--call daughter Thomes Dinning) # [phone]     Allergies as of 09/05/2018      Reactions   Doxycycline Anaphylaxis, Swelling   TONGUE SWELLING   Orajel Mouth-aid [carbamide Peroxide] Swelling   Pravastatin Other (See Comments)   Pain & weakness   Rosuvastatin Other (See Comments)  UNSPECIFIED REACTION    Sudafed [pseudoephedrine] Other (See Comments)   Pt couldn't sleep      Medication List    STOP taking these medications   feeding supplement (PRO-STAT SUGAR FREE 64) Liqd   ibuprofen 200 MG tablet Commonly known as: ADVIL   insulin glargine 100 UNIT/ML injection Commonly known as: LANTUS   Melatonin 3 MG Tabs   Omega-3 1000 MG Caps   oxyCODONE 5 MG immediate release tablet Commonly known as: Oxy IR/ROXICODONE   PROBIOTIC PO     TAKE these medications   acetaminophen 325 MG tablet Commonly known as: TYLENOL Take 1-2 tablets (325-650 mg total) by mouth every 4 (four) hours as needed for mild pain. Notes to patient: OTC   amantadine 100 MG capsule Commonly known as: SYMMETREL Take 1 capsule (100 mg total) by mouth daily. What changed: how to take this   amLODipine 10 MG tablet Commonly known as: NORVASC Take 1 tablet (10 mg total) by mouth daily. What changed: how to take this   clonazePAM 0.5 MG tablet--Rx # 30 pills.  Commonly known as: KLONOPIN Take 0.5 tablets (0.25 mg total) by mouth 2 (two) times daily. What changed: how to take this   famotidine 20 MG tablet Commonly known as: PEPCID Take 1 tablet (20 mg total) by mouth 2 (two) times daily. What changed: how to take this   hydrALAZINE 100 MG tablet Commonly known as: APRESOLINE Take 1 tablet (100 mg total) by mouth 3 (three) times daily. What changed: how to take this   levETIRAcetam  500 MG tablet Commonly known as: KEPPRA Take 1 tablet (500 mg total) by mouth 2 (two) times daily. Per tube What changed: how to take this   lisinopril 40 MG tablet Commonly known as: ZESTRIL Take 1 tablet (40 mg total) by mouth daily. What changed: Another medication with the same name was removed. Continue taking this medication, and follow the directions you see here.   polyethylene glycol 17 g packet Commonly known as: MIRALAX / GLYCOLAX Take 17 g by mouth daily. What changed: how to take this Notes to patient: OTC   sertraline 50 MG tablet Commonly known as: ZOLOFT Take 1 tablet (50 mg total) by mouth daily. What changed: how to take this   [place] 1 tablet into feeding tube daily.   thiamine 100 MG tablet Take 1 tablet (100 mg total) by mouth daily. What changed: how to take this Notes to patient: OTC      Follow-up Information    Meredith Staggers, MD Follow up.   Specialty: Physical Medicine and Rehabilitation Why: Office will call you with follow up appointment Contact information: [address] [place] [address] [phone]        Caryl Comes, MD. Call on 09/12/2018.   Specialty: Neurosurgery Why: has follow up appointment Contact information: [address] [place] [address] [phone]        Harlan Stains, MD Follow up.   Specialty: Family Medicine Why: call for post hospital follow up Contact information: [address] [place] [address] [phone]           Signed: Bary Leriche 09/09/2018, 10:21 PM

## 2018-09-05 NOTE — Progress Notes (Signed)
[facility] PHYSICAL MEDICINE & REHABILITATION PROGRESS NOTE   Subjective/Complaints: Patient seen sitting up in bed this morning eating breakfast.  He slept well overnight per report.  ROS: limited due to cognition  Objective:   No results found. No results for input(s): WBC, HGB, HCT, PLT in the last 72 hours. No results for input(s): NA, K, CL, CO2, GLUCOSE, BUN, CREATININE, CALCIUM in the last 72 hours.  Intake/Output Summary (Last 24 hours) at 09/05/2018 1601 Last data filed at 09/05/2018 0753 Gross per 24 hour  Intake 680 ml  Output -  Net 680 ml     Physical Exam: Vital Signs Blood pressure (!) 110/50, pulse 69, temperature 97.8 F (36.6 C), resp. rate 16, height 5\' 6"  (1.676 m), weight 55.2 kg, SpO2 97 %. Constitutional: No distress . Vital signs reviewed. HENT: Normocephalic.  Atraumatic. Eyes: EOMI.  No discharge. Cardiovascular: No JVD. Respiratory: Normal effort. GI: Non-distended.  + PEG Musc: No edema or tenderness in extremities. Neurological: He is alert Global aphasia, stable Motor: Limited by participation, however moving all 4 extremities spontaneously, unchanged Skin: bruises, lacs on arms Psychiatric: Unable to assess due to cognition  Assessment/Plan: 1. 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  Physiatrist is providing close team supervision and 24 hour management of active medical problems listed below.  Physiatrist and rehab team continue to assess barriers to discharge/monitor patient progress toward functional and medical goals  Care Tool:  Bathing    Body parts bathed by patient: Right arm, Left arm, Chest, Abdomen, Front perineal area, Buttocks, Right upper leg, Left upper leg, Right lower leg, Left lower leg, Face         Bathing assist Assist Level: Supervision/Verbal cueing     Upper Body Dressing/Undressing Upper body  dressing   What is the patient wearing?: Pull over shirt    Upper body assist Assist Level: Supervision/Verbal cueing    Lower Body Dressing/Undressing Lower body dressing      What is the patient wearing?: Pants     Lower body assist Assist for lower body dressing: Supervision/Verbal cueing     Toileting Toileting    Toileting assist Assist for toileting: Supervision/Verbal cueing     Transfers Chair/bed transfer  Transfers assist  Chair/bed transfer activity did not occur: N/A  Chair/bed transfer assist level: Supervision/Verbal cueing     Locomotion Ambulation   Ambulation assist      Assist level: Supervision/Verbal cueing Assistive device: No Device Max distance: 150   Walk 10 feet activity   Assist  Walk 10 feet activity did not occur: Safety/medical concerns(dynanuc balance deficits)  Assist level: Supervision/Verbal cueing Assistive device: No Device   Walk 50 feet activity   Assist Walk 50 feet with 2 turns activity did not occur: Safety/medical concerns(dynamic balance deficits)  Assist level: Supervision/Verbal cueing Assistive device: No Device    Walk 150 feet activity   Assist Walk 150 feet activity did not occur: Safety/medical concerns(dynamic balance deficits)  Assist level: Supervision/Verbal cueing Assistive device: No Device    Walk 10 feet on uneven surface  activity   Assist Walk 10 feet on uneven surfaces activity did not occur: Safety/medical concerns(dynamic balance deficits)   Assist level: Supervision/Verbal cueing Assistive device: Hand held assist   Wheelchair     Assist Will patient use wheelchair at discharge?: No Type of Wheelchair: Manual Wheelchair activity did not occur: N/A  Wheelchair assist level:  Moderate Assistance - Patient 50 - 74% Max wheelchair distance: 150 ft    Wheelchair 50 feet with 2 turns activity    Assist    Wheelchair 50 feet with 2 turns activity did not occur:  N/A   Assist Level: Moderate Assistance - Patient 50 - 74%   Wheelchair 150 feet activity     Assist Wheelchair 150 feet activity did not occur: N/A   Assist Level: Moderate Assistance - Patient 50 - 74%    Medical Problem List and Plan: 1.  Deficits with mobility, self-care, language, nutrition, cognition, swallowing secondary to left temporal ICH   Discharge today  Patient to follow-up with MD for transitional care management in 1-2 weeks 2.  Antithrombotics: -DVT/anticoagulation:  Mechanical: Sequential compression devices, below knee Bilateral lower extremities              Lower extremity Dopplers negative  -antiplatelet therapy: N/A 3. Pain Management: Oxycodone prn.  4. Mood: LCSW to follow for evaluation and support when appropriate. Continue Zoloft and Klonopin for mood stabilization.              -antipsychotic agents:   Added low dose seroquel for hs agitation/insomnia 5. Neuropsych: This patient is not capable of making decisions on his own behalf.    6. Skin/Wound Care: local care to skin 7. Fluids/Electrolytes/Nutrition:    -stopped HS TF   -diet advanced to regular thins  PEG removed on 7/8  Encourage fluids 8.  HTN: Monitor BP tid- continue amlodipine, hydralazine and lisinopril.    -increased lisinopril to 40mg  daily.   Controlled on 7/9 9. T2DM: Diet controlled-Hgb A1c- 5.5 at admission.    -lantus insulin and SSI d/c'ed  Relatively controlled on 7/9 10. ABLA: Monitor for signs of bleeding.   Hemoglobin 11.2 on 7/6  Continue to monitor 11. CKD stage III:   Creatinine 1.27 on 7/6  Encourage fluids 12. Pulmonary: pt self-decannulated .   -stoma closed, no issues.    LOS: 15 days A FACE TO FACE EVALUATION WAS PERFORMED  Mark [name] Mark [name] 09/05/2018, 4:01 PM

## 2018-09-05 NOTE — Progress Notes (Signed)
Patient discharged after education with family and PA. Dressing changes discussed. No further questions at this time, all belongings accounted for.

## 2018-09-06 DIAGNOSIS — E1151 Type 2 diabetes mellitus with diabetic peripheral angiopathy without gangrene: Secondary | ICD-10-CM | POA: Diagnosis not present

## 2018-09-06 DIAGNOSIS — I69151 Hemiplegia and hemiparesis following nontraumatic intracerebral hemorrhage affecting right dominant side: Secondary | ICD-10-CM | POA: Diagnosis not present

## 2018-09-06 DIAGNOSIS — Z48811 Encounter for surgical aftercare following surgery on the nervous system: Secondary | ICD-10-CM | POA: Diagnosis not present

## 2018-09-06 DIAGNOSIS — D631 Anemia in chronic kidney disease: Secondary | ICD-10-CM | POA: Diagnosis not present

## 2018-09-06 DIAGNOSIS — I69120 Aphasia following nontraumatic intracerebral hemorrhage: Secondary | ICD-10-CM | POA: Diagnosis not present

## 2018-09-06 DIAGNOSIS — E1122 Type 2 diabetes mellitus with diabetic chronic kidney disease: Secondary | ICD-10-CM | POA: Diagnosis not present

## 2018-09-06 DIAGNOSIS — I130 Hypertensive heart and chronic kidney disease with heart failure and stage 1 through stage 4 chronic kidney disease, or unspecified chronic kidney disease: Secondary | ICD-10-CM | POA: Diagnosis not present

## 2018-09-06 DIAGNOSIS — J9611 Chronic respiratory failure with hypoxia: Secondary | ICD-10-CM | POA: Diagnosis not present

## 2018-09-06 DIAGNOSIS — I509 Heart failure, unspecified: Secondary | ICD-10-CM | POA: Diagnosis not present

## 2018-09-06 DIAGNOSIS — N183 Chronic kidney disease, stage 3 unspecified: Secondary | ICD-10-CM | POA: Diagnosis not present

## 2018-09-09 DIAGNOSIS — E1151 Type 2 diabetes mellitus with diabetic peripheral angiopathy without gangrene: Secondary | ICD-10-CM | POA: Diagnosis not present

## 2018-09-09 DIAGNOSIS — N183 Chronic kidney disease, stage 3 (moderate): Secondary | ICD-10-CM | POA: Diagnosis not present

## 2018-09-09 DIAGNOSIS — I69151 Hemiplegia and hemiparesis following nontraumatic intracerebral hemorrhage affecting right dominant side: Secondary | ICD-10-CM | POA: Diagnosis not present

## 2018-09-09 DIAGNOSIS — I130 Hypertensive heart and chronic kidney disease with heart failure and stage 1 through stage 4 chronic kidney disease, or unspecified chronic kidney disease: Secondary | ICD-10-CM | POA: Diagnosis not present

## 2018-09-09 DIAGNOSIS — E1122 Type 2 diabetes mellitus with diabetic chronic kidney disease: Secondary | ICD-10-CM | POA: Diagnosis not present

## 2018-09-09 DIAGNOSIS — I69120 Aphasia following nontraumatic intracerebral hemorrhage: Secondary | ICD-10-CM | POA: Diagnosis not present

## 2018-09-09 DIAGNOSIS — D631 Anemia in chronic kidney disease: Secondary | ICD-10-CM | POA: Diagnosis not present

## 2018-09-09 DIAGNOSIS — J9611 Chronic respiratory failure with hypoxia: Secondary | ICD-10-CM | POA: Diagnosis not present

## 2018-09-09 DIAGNOSIS — I509 Heart failure, unspecified: Secondary | ICD-10-CM | POA: Diagnosis not present

## 2018-09-09 DIAGNOSIS — Z48811 Encounter for surgical aftercare following surgery on the nervous system: Secondary | ICD-10-CM | POA: Diagnosis not present

## 2018-09-10 ENCOUNTER — Telehealth: Payer: Self-pay

## 2018-09-10 DIAGNOSIS — I69151 Hemiplegia and hemiparesis following nontraumatic intracerebral hemorrhage affecting right dominant side: Secondary | ICD-10-CM | POA: Diagnosis not present

## 2018-09-10 DIAGNOSIS — D631 Anemia in chronic kidney disease: Secondary | ICD-10-CM | POA: Diagnosis not present

## 2018-09-10 DIAGNOSIS — I69320 Aphasia following cerebral infarction: Secondary | ICD-10-CM | POA: Diagnosis not present

## 2018-09-10 DIAGNOSIS — E1122 Type 2 diabetes mellitus with diabetic chronic kidney disease: Secondary | ICD-10-CM | POA: Diagnosis not present

## 2018-09-10 DIAGNOSIS — Z48811 Encounter for surgical aftercare following surgery on the nervous system: Secondary | ICD-10-CM | POA: Diagnosis not present

## 2018-09-10 DIAGNOSIS — I69351 Hemiplegia and hemiparesis following cerebral infarction affecting right dominant side: Secondary | ICD-10-CM | POA: Diagnosis not present

## 2018-09-10 DIAGNOSIS — I69120 Aphasia following nontraumatic intracerebral hemorrhage: Secondary | ICD-10-CM | POA: Diagnosis not present

## 2018-09-10 DIAGNOSIS — I130 Hypertensive heart and chronic kidney disease with heart failure and stage 1 through stage 4 chronic kidney disease, or unspecified chronic kidney disease: Secondary | ICD-10-CM | POA: Diagnosis not present

## 2018-09-10 DIAGNOSIS — I509 Heart failure, unspecified: Secondary | ICD-10-CM | POA: Diagnosis not present

## 2018-09-10 DIAGNOSIS — R69 Illness, unspecified: Secondary | ICD-10-CM | POA: Diagnosis not present

## 2018-09-10 DIAGNOSIS — I129 Hypertensive chronic kidney disease with stage 1 through stage 4 chronic kidney disease, or unspecified chronic kidney disease: Secondary | ICD-10-CM | POA: Diagnosis not present

## 2018-09-10 DIAGNOSIS — E785 Hyperlipidemia, unspecified: Secondary | ICD-10-CM | POA: Diagnosis not present

## 2018-09-10 DIAGNOSIS — I69391 Dysphagia following cerebral infarction: Secondary | ICD-10-CM | POA: Diagnosis not present

## 2018-09-10 DIAGNOSIS — E1151 Type 2 diabetes mellitus with diabetic peripheral angiopathy without gangrene: Secondary | ICD-10-CM | POA: Diagnosis not present

## 2018-09-10 DIAGNOSIS — J9611 Chronic respiratory failure with hypoxia: Secondary | ICD-10-CM | POA: Diagnosis not present

## 2018-09-10 DIAGNOSIS — N183 Chronic kidney disease, stage 3 (moderate): Secondary | ICD-10-CM | POA: Diagnosis not present

## 2018-09-10 DIAGNOSIS — E1169 Type 2 diabetes mellitus with other specified complication: Secondary | ICD-10-CM | POA: Diagnosis not present

## 2018-09-10 NOTE — Telephone Encounter (Signed)
[place]  Patient Name: Mark [name] DOB: 1950/09/28 Appointment Date and Time: 09-17-2018 / 920am With: Zella Ball first then Dr. Naaman Plummer  Transitional Care Questions   Questions for our staff to ask patients on Transitional care 48 hour phone call:   1. Are you/is patient experiencing any problems since coming home? Are there any questions regarding any aspect of care? NO NEW PROBLEMS  2. Are there any questions regarding medications administration/dosing? Are meds being taken as prescribed? Patient should review meds with caller to confirm NEW ISSUES WITH MEDS  3. Have there been any falls? NO FALLS  4. Has Home Health been to the house and/or have they contacted you? If not, have you tried to contact them? Can we help you contact them? HOME HEALTH MADE CONTACT  5. Are bowels and bladder emptying properly? Are there any unexpected incontinence issues? If applicable, is patient following bowel/bladder programs? NO ISSUES WITH BLADDER, STILL CONSTIPATED  6. Any fevers, problems with breathing, unexpected pain? NO  7. Are there any skin problems or new areas of breakdown? NO  8. Has the patient/family member arranged specialty MD follow up (ie cardiology/neurology/renal/surgical/etc)? Can we help arrange?  YES  9. Does the patient need any other services or support that we can help arrange? NO  10. Are caregivers following through as expected in assisting the patient? YES  11. Has the patient quit smoking, drinking alcohol, or using drugs as recommended? NA    [facility] [address] [phone]

## 2018-09-11 DIAGNOSIS — N183 Chronic kidney disease, stage 3 (moderate): Secondary | ICD-10-CM | POA: Diagnosis not present

## 2018-09-11 DIAGNOSIS — E1151 Type 2 diabetes mellitus with diabetic peripheral angiopathy without gangrene: Secondary | ICD-10-CM | POA: Diagnosis not present

## 2018-09-11 DIAGNOSIS — Z48811 Encounter for surgical aftercare following surgery on the nervous system: Secondary | ICD-10-CM | POA: Diagnosis not present

## 2018-09-11 DIAGNOSIS — I69151 Hemiplegia and hemiparesis following nontraumatic intracerebral hemorrhage affecting right dominant side: Secondary | ICD-10-CM | POA: Diagnosis not present

## 2018-09-11 DIAGNOSIS — I509 Heart failure, unspecified: Secondary | ICD-10-CM | POA: Diagnosis not present

## 2018-09-11 DIAGNOSIS — J9611 Chronic respiratory failure with hypoxia: Secondary | ICD-10-CM | POA: Diagnosis not present

## 2018-09-11 DIAGNOSIS — I130 Hypertensive heart and chronic kidney disease with heart failure and stage 1 through stage 4 chronic kidney disease, or unspecified chronic kidney disease: Secondary | ICD-10-CM | POA: Diagnosis not present

## 2018-09-11 DIAGNOSIS — E1122 Type 2 diabetes mellitus with diabetic chronic kidney disease: Secondary | ICD-10-CM | POA: Diagnosis not present

## 2018-09-11 DIAGNOSIS — I69120 Aphasia following nontraumatic intracerebral hemorrhage: Secondary | ICD-10-CM | POA: Diagnosis not present

## 2018-09-11 DIAGNOSIS — D631 Anemia in chronic kidney disease: Secondary | ICD-10-CM | POA: Diagnosis not present

## 2018-09-13 DIAGNOSIS — N183 Chronic kidney disease, stage 3 (moderate): Secondary | ICD-10-CM | POA: Diagnosis not present

## 2018-09-13 DIAGNOSIS — Z48811 Encounter for surgical aftercare following surgery on the nervous system: Secondary | ICD-10-CM | POA: Diagnosis not present

## 2018-09-13 DIAGNOSIS — J9611 Chronic respiratory failure with hypoxia: Secondary | ICD-10-CM | POA: Diagnosis not present

## 2018-09-13 DIAGNOSIS — I69151 Hemiplegia and hemiparesis following nontraumatic intracerebral hemorrhage affecting right dominant side: Secondary | ICD-10-CM | POA: Diagnosis not present

## 2018-09-13 DIAGNOSIS — I69120 Aphasia following nontraumatic intracerebral hemorrhage: Secondary | ICD-10-CM | POA: Diagnosis not present

## 2018-09-13 DIAGNOSIS — D631 Anemia in chronic kidney disease: Secondary | ICD-10-CM | POA: Diagnosis not present

## 2018-09-13 DIAGNOSIS — R69 Illness, unspecified: Secondary | ICD-10-CM | POA: Diagnosis not present

## 2018-09-13 DIAGNOSIS — E1122 Type 2 diabetes mellitus with diabetic chronic kidney disease: Secondary | ICD-10-CM | POA: Diagnosis not present

## 2018-09-13 DIAGNOSIS — E1151 Type 2 diabetes mellitus with diabetic peripheral angiopathy without gangrene: Secondary | ICD-10-CM | POA: Diagnosis not present

## 2018-09-13 DIAGNOSIS — I509 Heart failure, unspecified: Secondary | ICD-10-CM | POA: Diagnosis not present

## 2018-09-13 DIAGNOSIS — I130 Hypertensive heart and chronic kidney disease with heart failure and stage 1 through stage 4 chronic kidney disease, or unspecified chronic kidney disease: Secondary | ICD-10-CM | POA: Diagnosis not present

## 2018-09-16 DIAGNOSIS — J9611 Chronic respiratory failure with hypoxia: Secondary | ICD-10-CM | POA: Diagnosis not present

## 2018-09-16 DIAGNOSIS — D631 Anemia in chronic kidney disease: Secondary | ICD-10-CM | POA: Diagnosis not present

## 2018-09-16 DIAGNOSIS — E1151 Type 2 diabetes mellitus with diabetic peripheral angiopathy without gangrene: Secondary | ICD-10-CM | POA: Diagnosis not present

## 2018-09-16 DIAGNOSIS — I69151 Hemiplegia and hemiparesis following nontraumatic intracerebral hemorrhage affecting right dominant side: Secondary | ICD-10-CM | POA: Diagnosis not present

## 2018-09-16 DIAGNOSIS — N183 Chronic kidney disease, stage 3 (moderate): Secondary | ICD-10-CM | POA: Diagnosis not present

## 2018-09-16 DIAGNOSIS — I509 Heart failure, unspecified: Secondary | ICD-10-CM | POA: Diagnosis not present

## 2018-09-16 DIAGNOSIS — I69120 Aphasia following nontraumatic intracerebral hemorrhage: Secondary | ICD-10-CM | POA: Diagnosis not present

## 2018-09-16 DIAGNOSIS — Z48811 Encounter for surgical aftercare following surgery on the nervous system: Secondary | ICD-10-CM | POA: Diagnosis not present

## 2018-09-16 DIAGNOSIS — I130 Hypertensive heart and chronic kidney disease with heart failure and stage 1 through stage 4 chronic kidney disease, or unspecified chronic kidney disease: Secondary | ICD-10-CM | POA: Diagnosis not present

## 2018-09-16 DIAGNOSIS — E1122 Type 2 diabetes mellitus with diabetic chronic kidney disease: Secondary | ICD-10-CM | POA: Diagnosis not present

## 2018-09-17 ENCOUNTER — Encounter: Payer: Medicare HMO | Attending: Registered Nurse | Admitting: Registered Nurse

## 2018-09-17 ENCOUNTER — Other Ambulatory Visit: Payer: Self-pay

## 2018-09-17 VITALS — BP 129/77 | HR 84 | Temp 97.9°F | Ht 67.0 in | Wt 123.0 lb

## 2018-09-17 DIAGNOSIS — N183 Chronic kidney disease, stage 3 unspecified: Secondary | ICD-10-CM

## 2018-09-17 DIAGNOSIS — I69120 Aphasia following nontraumatic intracerebral hemorrhage: Secondary | ICD-10-CM | POA: Diagnosis not present

## 2018-09-17 DIAGNOSIS — I10 Essential (primary) hypertension: Secondary | ICD-10-CM | POA: Diagnosis not present

## 2018-09-17 DIAGNOSIS — I611 Nontraumatic intracerebral hemorrhage in hemisphere, cortical: Secondary | ICD-10-CM

## 2018-09-17 DIAGNOSIS — Z931 Gastrostomy status: Secondary | ICD-10-CM | POA: Insufficient documentation

## 2018-09-17 DIAGNOSIS — Z48811 Encounter for surgical aftercare following surgery on the nervous system: Secondary | ICD-10-CM | POA: Diagnosis not present

## 2018-09-17 DIAGNOSIS — R4701 Aphasia: Secondary | ICD-10-CM | POA: Insufficient documentation

## 2018-09-17 DIAGNOSIS — E1151 Type 2 diabetes mellitus with diabetic peripheral angiopathy without gangrene: Secondary | ICD-10-CM | POA: Diagnosis not present

## 2018-09-17 DIAGNOSIS — E119 Type 2 diabetes mellitus without complications: Secondary | ICD-10-CM | POA: Diagnosis not present

## 2018-09-17 DIAGNOSIS — I509 Heart failure, unspecified: Secondary | ICD-10-CM | POA: Diagnosis not present

## 2018-09-17 DIAGNOSIS — I130 Hypertensive heart and chronic kidney disease with heart failure and stage 1 through stage 4 chronic kidney disease, or unspecified chronic kidney disease: Secondary | ICD-10-CM | POA: Diagnosis not present

## 2018-09-17 DIAGNOSIS — E1122 Type 2 diabetes mellitus with diabetic chronic kidney disease: Secondary | ICD-10-CM | POA: Diagnosis not present

## 2018-09-17 DIAGNOSIS — I69151 Hemiplegia and hemiparesis following nontraumatic intracerebral hemorrhage affecting right dominant side: Secondary | ICD-10-CM | POA: Diagnosis not present

## 2018-09-17 DIAGNOSIS — J9611 Chronic respiratory failure with hypoxia: Secondary | ICD-10-CM | POA: Diagnosis not present

## 2018-09-17 DIAGNOSIS — D631 Anemia in chronic kidney disease: Secondary | ICD-10-CM | POA: Diagnosis not present

## 2018-09-17 NOTE — Progress Notes (Signed)
Subjective:    Patient ID: Mark [name], male    DOB: 28-Oct-1950, 68 y.o.   MRN: [id number]  HPI: Mark [name] is a 68 y.o. male who  Is here for hospital follow up appointment for left temporal lobe hemorrhage, global aphasia, essential hypertension, type 2 DM in non-obese, CKD stage III and s/p percutaneous endoscopic gastrotomy tube placement.  Per Discharge Summary Reesa Chew Pa-C:  Mr. 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 He underwent left craniectomy for evacuation of hematoma after partial embolization of AVM. He was taken back to OR on 07/18/2018 for resection of residual AVM with cranioplasty and PEG Placement. Also Trach Placed on 07/20/2018.   He was admitted to inpatient rehabilitation on 08/21/2018 and discharged home on 09/05/2018. Receiving outpatient therapy with [facility].   Mr. Cail with expressive aphasia, son in room. All questions answered. Mr. Pacer denies pain. He rated his pain 0. Son reports Mr. Odenthal has a good appetite.   Trach site area cleansed and dressing applied. Old PEG Tube site area cleansed and dressing applied. No drainage noted.   Pain Inventory Average Pain 0 Pain Right Now 0 My pain is na  In the last 24 hours, has pain interfered with the following? General activity 0 Relation with others 0 Enjoyment of life 0 What TIME of day is your pain at its worst? . Sleep (in general) Fair  Pain is worse with: . Pain improves with: na Relief from Meds: na  Mobility walk without assistance walk with assistance ability to climb steps?  yes do you drive?  no  Function not employed: date last employed . I need assistance with the following:  meal prep, household duties and shopping  Neuro/Psych weakness trouble walking confusion  Prior Studies Any changes since last visit?  no  Physicians  involved in your care Any changes since last visit?  no   Family History  Problem Relation Age of Onset  . Dementia Mother   . Diabetes Mother   . Heart disease Mother   . Diabetes Father   . Heart disease Father    Social History   Socioeconomic History  . Marital status: Widowed    Spouse name: Not on file  . Number of children: Not on file  . Years of education: Not on file  . Highest education level: Not on file  Occupational History  . Not on file  Social Needs  . Financial resource strain: Not on file  . Food insecurity    Worry: Not on file    Inability: Not on file  . Transportation needs    Medical: Not on file    Non-medical: Not on file  Tobacco Use  . Smoking status: Current Every Day Smoker    Packs/day: 1.50    Types: Cigarettes  . Smokeless tobacco: Never Used  . Tobacco comment: wants patches in hospital  Substance and Sexual Activity  . Alcohol use: No  . Drug use: Never  . Sexual activity: Not on file  Lifestyle  . Physical activity    Days per week: Not on file    Minutes per session: Not on file  . Stress: Not on file  Relationships  . Social Herbalist on phone: Not on file    Gets together: Not on file    Attends religious service: Not on file  Active member of club or organization: Not on file    Attends meetings of clubs or organizations: Not on file    Relationship status: Not on file  Other Topics Concern  . Not on file  Social History Narrative  . Not on file   Past Surgical History:  Procedure Laterality Date  . ABDOMINAL AORTIC ENDOVASCULAR STENT GRAFT N/A 02/01/2018   Procedure: ABDOMINAL AORTIC ENDOVASCULAR STENT GRAFT;  Surgeon: Nada Libman, MD;  Location: [facility] OR;  Service: Vascular;  Laterality: N/A;  . NECK SURGERY    . VEIN BYPASS SURGERY     Past Medical History:  Diagnosis Date  . Acute on chronic respiratory failure with hypoxia (HCC)   . Altered mental status, unspecified   . Chronic kidney  disease    CKD stage 3  . Chronic kidney disease, stage III (moderate) (HCC)   . Concussion    multiple in high school  . Coronary artery disease    pt denies  . Diabetes mellitus without complication (HCC)    borderline  . Hypertension   . Nontraumatic intracranial hemorrhage, unspecified (HCC)   . PVD (peripheral vascular disease) (HCC)    leg stents femoral artery  . Stroke [facility])    3 years ago- no residual   There were no vitals taken for this visit.  Opioid Risk Score:   Fall Risk Score:  `1  Depression screen PHQ 2/9  No flowsheet data found.   Review of Systems  Constitutional: Negative.   HENT: Negative.   Eyes: Negative.   Respiratory: Negative.   Cardiovascular: Negative.   Gastrointestinal: Negative.   Endocrine: Negative.   Genitourinary: Negative.   Musculoskeletal: Positive for gait problem.  Skin: Negative.   Allergic/Immunologic: Negative.   Neurological: Positive for weakness.  Hematological: Negative.   Psychiatric/Behavioral: Positive for confusion.  All other systems reviewed and are negative.      Objective:   Physical Exam Vitals signs and nursing note reviewed.  Constitutional:      Appearance: Normal appearance.  Neck:     Musculoskeletal: Normal range of motion and neck supple.  Cardiovascular:     Rate and Rhythm: Normal rate and regular rhythm.     Pulses: Normal pulses.     Heart sounds: Normal heart sounds.  Pulmonary:     Effort: Pulmonary effort is normal.     Breath sounds: Normal breath sounds.  Musculoskeletal:     Comments: Normal Muscle Bulk and Muscle Testing Reveals:  Upper Extremities: Full ROM and Muscle Strength 5/5 Lower Extremities: Full ROM and Muscle Strength 5/5 Arises from table Slowly Narrow Based  Gait   Skin:    General: Skin is warm and dry.  Neurological:     Mental Status: He is alert.     Comments: Alert and Oriented x1  Psychiatric:        Mood and Affect: Mood normal.        Behavior:  Behavior normal.           Assessment & Plan:  1. Left Temporal Lobe Hemorrhage/ Global Aphasia: Continue Home Health Therapy. Neurology Following.  2. Essential Hypertension: Continue current medication regimen. PCP Following.  3. Type 2 DM in non-obese: PCP Following. Continue to Monitor.  4. CKD Stage III: PCP Following. Continue to Monitor.  5. S/P Percutaneous PEG Tube Placement: Peg Tube was removed on 09/04/2018. Site was cleansed and dressing applied.   20 minutes of face to face patient care time was spent  during this visit. All questions were encouraged and answered.  F/U with Dr Wynn BankerKirsteins in 4- 6 weeks.

## 2018-09-19 DIAGNOSIS — I69120 Aphasia following nontraumatic intracerebral hemorrhage: Secondary | ICD-10-CM | POA: Diagnosis not present

## 2018-09-19 DIAGNOSIS — I509 Heart failure, unspecified: Secondary | ICD-10-CM | POA: Diagnosis not present

## 2018-09-19 DIAGNOSIS — I69151 Hemiplegia and hemiparesis following nontraumatic intracerebral hemorrhage affecting right dominant side: Secondary | ICD-10-CM | POA: Diagnosis not present

## 2018-09-19 DIAGNOSIS — Z48811 Encounter for surgical aftercare following surgery on the nervous system: Secondary | ICD-10-CM | POA: Diagnosis not present

## 2018-09-19 DIAGNOSIS — D631 Anemia in chronic kidney disease: Secondary | ICD-10-CM | POA: Diagnosis not present

## 2018-09-19 DIAGNOSIS — E1122 Type 2 diabetes mellitus with diabetic chronic kidney disease: Secondary | ICD-10-CM | POA: Diagnosis not present

## 2018-09-19 DIAGNOSIS — E1151 Type 2 diabetes mellitus with diabetic peripheral angiopathy without gangrene: Secondary | ICD-10-CM | POA: Diagnosis not present

## 2018-09-19 DIAGNOSIS — I130 Hypertensive heart and chronic kidney disease with heart failure and stage 1 through stage 4 chronic kidney disease, or unspecified chronic kidney disease: Secondary | ICD-10-CM | POA: Diagnosis not present

## 2018-09-19 DIAGNOSIS — J9611 Chronic respiratory failure with hypoxia: Secondary | ICD-10-CM | POA: Diagnosis not present

## 2018-09-19 DIAGNOSIS — N183 Chronic kidney disease, stage 3 (moderate): Secondary | ICD-10-CM | POA: Diagnosis not present

## 2018-09-23 DIAGNOSIS — D631 Anemia in chronic kidney disease: Secondary | ICD-10-CM | POA: Diagnosis not present

## 2018-09-23 DIAGNOSIS — I130 Hypertensive heart and chronic kidney disease with heart failure and stage 1 through stage 4 chronic kidney disease, or unspecified chronic kidney disease: Secondary | ICD-10-CM | POA: Diagnosis not present

## 2018-09-23 DIAGNOSIS — E1122 Type 2 diabetes mellitus with diabetic chronic kidney disease: Secondary | ICD-10-CM | POA: Diagnosis not present

## 2018-09-23 DIAGNOSIS — I69151 Hemiplegia and hemiparesis following nontraumatic intracerebral hemorrhage affecting right dominant side: Secondary | ICD-10-CM | POA: Diagnosis not present

## 2018-09-23 DIAGNOSIS — J9611 Chronic respiratory failure with hypoxia: Secondary | ICD-10-CM | POA: Diagnosis not present

## 2018-09-23 DIAGNOSIS — Z48811 Encounter for surgical aftercare following surgery on the nervous system: Secondary | ICD-10-CM | POA: Diagnosis not present

## 2018-09-23 DIAGNOSIS — N183 Chronic kidney disease, stage 3 (moderate): Secondary | ICD-10-CM | POA: Diagnosis not present

## 2018-09-23 DIAGNOSIS — I509 Heart failure, unspecified: Secondary | ICD-10-CM | POA: Diagnosis not present

## 2018-09-23 DIAGNOSIS — E1151 Type 2 diabetes mellitus with diabetic peripheral angiopathy without gangrene: Secondary | ICD-10-CM | POA: Diagnosis not present

## 2018-09-23 DIAGNOSIS — I69120 Aphasia following nontraumatic intracerebral hemorrhage: Secondary | ICD-10-CM | POA: Diagnosis not present

## 2018-09-24 ENCOUNTER — Encounter: Payer: Self-pay | Admitting: Registered Nurse

## 2018-09-24 DIAGNOSIS — Z48811 Encounter for surgical aftercare following surgery on the nervous system: Secondary | ICD-10-CM | POA: Diagnosis not present

## 2018-09-24 DIAGNOSIS — J9611 Chronic respiratory failure with hypoxia: Secondary | ICD-10-CM | POA: Diagnosis not present

## 2018-09-24 DIAGNOSIS — N183 Chronic kidney disease, stage 3 (moderate): Secondary | ICD-10-CM | POA: Diagnosis not present

## 2018-09-24 DIAGNOSIS — E1151 Type 2 diabetes mellitus with diabetic peripheral angiopathy without gangrene: Secondary | ICD-10-CM | POA: Diagnosis not present

## 2018-09-24 DIAGNOSIS — D631 Anemia in chronic kidney disease: Secondary | ICD-10-CM | POA: Diagnosis not present

## 2018-09-24 DIAGNOSIS — I69151 Hemiplegia and hemiparesis following nontraumatic intracerebral hemorrhage affecting right dominant side: Secondary | ICD-10-CM | POA: Diagnosis not present

## 2018-09-24 DIAGNOSIS — I130 Hypertensive heart and chronic kidney disease with heart failure and stage 1 through stage 4 chronic kidney disease, or unspecified chronic kidney disease: Secondary | ICD-10-CM | POA: Diagnosis not present

## 2018-09-24 DIAGNOSIS — E1122 Type 2 diabetes mellitus with diabetic chronic kidney disease: Secondary | ICD-10-CM | POA: Diagnosis not present

## 2018-09-24 DIAGNOSIS — I509 Heart failure, unspecified: Secondary | ICD-10-CM | POA: Diagnosis not present

## 2018-09-24 DIAGNOSIS — I69120 Aphasia following nontraumatic intracerebral hemorrhage: Secondary | ICD-10-CM | POA: Diagnosis not present

## 2018-09-25 DIAGNOSIS — N183 Chronic kidney disease, stage 3 (moderate): Secondary | ICD-10-CM | POA: Diagnosis not present

## 2018-09-25 DIAGNOSIS — Z48811 Encounter for surgical aftercare following surgery on the nervous system: Secondary | ICD-10-CM | POA: Diagnosis not present

## 2018-09-25 DIAGNOSIS — E1122 Type 2 diabetes mellitus with diabetic chronic kidney disease: Secondary | ICD-10-CM | POA: Diagnosis not present

## 2018-09-25 DIAGNOSIS — E1151 Type 2 diabetes mellitus with diabetic peripheral angiopathy without gangrene: Secondary | ICD-10-CM | POA: Diagnosis not present

## 2018-09-25 DIAGNOSIS — J9611 Chronic respiratory failure with hypoxia: Secondary | ICD-10-CM | POA: Diagnosis not present

## 2018-09-25 DIAGNOSIS — I69151 Hemiplegia and hemiparesis following nontraumatic intracerebral hemorrhage affecting right dominant side: Secondary | ICD-10-CM | POA: Diagnosis not present

## 2018-09-25 DIAGNOSIS — I509 Heart failure, unspecified: Secondary | ICD-10-CM | POA: Diagnosis not present

## 2018-09-25 DIAGNOSIS — I130 Hypertensive heart and chronic kidney disease with heart failure and stage 1 through stage 4 chronic kidney disease, or unspecified chronic kidney disease: Secondary | ICD-10-CM | POA: Diagnosis not present

## 2018-09-25 DIAGNOSIS — I69120 Aphasia following nontraumatic intracerebral hemorrhage: Secondary | ICD-10-CM | POA: Diagnosis not present

## 2018-09-25 DIAGNOSIS — D631 Anemia in chronic kidney disease: Secondary | ICD-10-CM | POA: Diagnosis not present

## 2018-09-26 DIAGNOSIS — J9611 Chronic respiratory failure with hypoxia: Secondary | ICD-10-CM | POA: Diagnosis not present

## 2018-09-26 DIAGNOSIS — I509 Heart failure, unspecified: Secondary | ICD-10-CM | POA: Diagnosis not present

## 2018-09-26 DIAGNOSIS — I6930 Unspecified sequelae of cerebral infarction: Secondary | ICD-10-CM | POA: Diagnosis not present

## 2018-09-26 DIAGNOSIS — D631 Anemia in chronic kidney disease: Secondary | ICD-10-CM | POA: Diagnosis not present

## 2018-09-26 DIAGNOSIS — I69120 Aphasia following nontraumatic intracerebral hemorrhage: Secondary | ICD-10-CM | POA: Diagnosis not present

## 2018-09-26 DIAGNOSIS — Z48811 Encounter for surgical aftercare following surgery on the nervous system: Secondary | ICD-10-CM | POA: Diagnosis not present

## 2018-09-26 DIAGNOSIS — N183 Chronic kidney disease, stage 3 (moderate): Secondary | ICD-10-CM | POA: Diagnosis not present

## 2018-09-26 DIAGNOSIS — I739 Peripheral vascular disease, unspecified: Secondary | ICD-10-CM | POA: Diagnosis not present

## 2018-09-26 DIAGNOSIS — I130 Hypertensive heart and chronic kidney disease with heart failure and stage 1 through stage 4 chronic kidney disease, or unspecified chronic kidney disease: Secondary | ICD-10-CM | POA: Diagnosis not present

## 2018-09-26 DIAGNOSIS — E1122 Type 2 diabetes mellitus with diabetic chronic kidney disease: Secondary | ICD-10-CM | POA: Diagnosis not present

## 2018-09-26 DIAGNOSIS — I69151 Hemiplegia and hemiparesis following nontraumatic intracerebral hemorrhage affecting right dominant side: Secondary | ICD-10-CM | POA: Diagnosis not present

## 2018-09-26 DIAGNOSIS — E1151 Type 2 diabetes mellitus with diabetic peripheral angiopathy without gangrene: Secondary | ICD-10-CM | POA: Diagnosis not present

## 2018-09-26 DIAGNOSIS — I10 Essential (primary) hypertension: Secondary | ICD-10-CM | POA: Diagnosis not present

## 2018-09-26 DIAGNOSIS — I610 Nontraumatic intracerebral hemorrhage in hemisphere, subcortical: Secondary | ICD-10-CM | POA: Diagnosis not present

## 2018-09-26 DIAGNOSIS — I428 Other cardiomyopathies: Secondary | ICD-10-CM | POA: Diagnosis not present

## 2018-09-27 ENCOUNTER — Other Ambulatory Visit: Payer: Self-pay | Admitting: Physical Medicine and Rehabilitation

## 2018-09-30 DIAGNOSIS — I130 Hypertensive heart and chronic kidney disease with heart failure and stage 1 through stage 4 chronic kidney disease, or unspecified chronic kidney disease: Secondary | ICD-10-CM | POA: Diagnosis not present

## 2018-09-30 DIAGNOSIS — I69120 Aphasia following nontraumatic intracerebral hemorrhage: Secondary | ICD-10-CM | POA: Diagnosis not present

## 2018-09-30 DIAGNOSIS — J9611 Chronic respiratory failure with hypoxia: Secondary | ICD-10-CM | POA: Diagnosis not present

## 2018-09-30 DIAGNOSIS — N183 Chronic kidney disease, stage 3 (moderate): Secondary | ICD-10-CM | POA: Diagnosis not present

## 2018-09-30 DIAGNOSIS — E1122 Type 2 diabetes mellitus with diabetic chronic kidney disease: Secondary | ICD-10-CM | POA: Diagnosis not present

## 2018-09-30 DIAGNOSIS — Z48811 Encounter for surgical aftercare following surgery on the nervous system: Secondary | ICD-10-CM | POA: Diagnosis not present

## 2018-09-30 DIAGNOSIS — E1151 Type 2 diabetes mellitus with diabetic peripheral angiopathy without gangrene: Secondary | ICD-10-CM | POA: Diagnosis not present

## 2018-09-30 DIAGNOSIS — I509 Heart failure, unspecified: Secondary | ICD-10-CM | POA: Diagnosis not present

## 2018-09-30 DIAGNOSIS — I69151 Hemiplegia and hemiparesis following nontraumatic intracerebral hemorrhage affecting right dominant side: Secondary | ICD-10-CM | POA: Diagnosis not present

## 2018-09-30 DIAGNOSIS — D631 Anemia in chronic kidney disease: Secondary | ICD-10-CM | POA: Diagnosis not present

## 2018-10-02 DIAGNOSIS — I69151 Hemiplegia and hemiparesis following nontraumatic intracerebral hemorrhage affecting right dominant side: Secondary | ICD-10-CM | POA: Diagnosis not present

## 2018-10-02 DIAGNOSIS — Z48811 Encounter for surgical aftercare following surgery on the nervous system: Secondary | ICD-10-CM | POA: Diagnosis not present

## 2018-10-02 DIAGNOSIS — J9611 Chronic respiratory failure with hypoxia: Secondary | ICD-10-CM | POA: Diagnosis not present

## 2018-10-02 DIAGNOSIS — I509 Heart failure, unspecified: Secondary | ICD-10-CM | POA: Diagnosis not present

## 2018-10-02 DIAGNOSIS — E1122 Type 2 diabetes mellitus with diabetic chronic kidney disease: Secondary | ICD-10-CM | POA: Diagnosis not present

## 2018-10-02 DIAGNOSIS — N183 Chronic kidney disease, stage 3 (moderate): Secondary | ICD-10-CM | POA: Diagnosis not present

## 2018-10-02 DIAGNOSIS — I69120 Aphasia following nontraumatic intracerebral hemorrhage: Secondary | ICD-10-CM | POA: Diagnosis not present

## 2018-10-02 DIAGNOSIS — E1151 Type 2 diabetes mellitus with diabetic peripheral angiopathy without gangrene: Secondary | ICD-10-CM | POA: Diagnosis not present

## 2018-10-02 DIAGNOSIS — D631 Anemia in chronic kidney disease: Secondary | ICD-10-CM | POA: Diagnosis not present

## 2018-10-02 DIAGNOSIS — I130 Hypertensive heart and chronic kidney disease with heart failure and stage 1 through stage 4 chronic kidney disease, or unspecified chronic kidney disease: Secondary | ICD-10-CM | POA: Diagnosis not present

## 2018-10-03 DIAGNOSIS — I69120 Aphasia following nontraumatic intracerebral hemorrhage: Secondary | ICD-10-CM | POA: Diagnosis not present

## 2018-10-03 DIAGNOSIS — D631 Anemia in chronic kidney disease: Secondary | ICD-10-CM | POA: Diagnosis not present

## 2018-10-03 DIAGNOSIS — J9611 Chronic respiratory failure with hypoxia: Secondary | ICD-10-CM | POA: Diagnosis not present

## 2018-10-03 DIAGNOSIS — N183 Chronic kidney disease, stage 3 (moderate): Secondary | ICD-10-CM | POA: Diagnosis not present

## 2018-10-03 DIAGNOSIS — Z48811 Encounter for surgical aftercare following surgery on the nervous system: Secondary | ICD-10-CM | POA: Diagnosis not present

## 2018-10-03 DIAGNOSIS — I69151 Hemiplegia and hemiparesis following nontraumatic intracerebral hemorrhage affecting right dominant side: Secondary | ICD-10-CM | POA: Diagnosis not present

## 2018-10-03 DIAGNOSIS — I509 Heart failure, unspecified: Secondary | ICD-10-CM | POA: Diagnosis not present

## 2018-10-03 DIAGNOSIS — E1122 Type 2 diabetes mellitus with diabetic chronic kidney disease: Secondary | ICD-10-CM | POA: Diagnosis not present

## 2018-10-03 DIAGNOSIS — I130 Hypertensive heart and chronic kidney disease with heart failure and stage 1 through stage 4 chronic kidney disease, or unspecified chronic kidney disease: Secondary | ICD-10-CM | POA: Diagnosis not present

## 2018-10-03 DIAGNOSIS — E1151 Type 2 diabetes mellitus with diabetic peripheral angiopathy without gangrene: Secondary | ICD-10-CM | POA: Diagnosis not present

## 2018-10-04 DIAGNOSIS — I509 Heart failure, unspecified: Secondary | ICD-10-CM | POA: Diagnosis not present

## 2018-10-04 DIAGNOSIS — Z48811 Encounter for surgical aftercare following surgery on the nervous system: Secondary | ICD-10-CM | POA: Diagnosis not present

## 2018-10-04 DIAGNOSIS — I69120 Aphasia following nontraumatic intracerebral hemorrhage: Secondary | ICD-10-CM | POA: Diagnosis not present

## 2018-10-04 DIAGNOSIS — I130 Hypertensive heart and chronic kidney disease with heart failure and stage 1 through stage 4 chronic kidney disease, or unspecified chronic kidney disease: Secondary | ICD-10-CM | POA: Diagnosis not present

## 2018-10-04 DIAGNOSIS — I69151 Hemiplegia and hemiparesis following nontraumatic intracerebral hemorrhage affecting right dominant side: Secondary | ICD-10-CM | POA: Diagnosis not present

## 2018-10-04 DIAGNOSIS — N183 Chronic kidney disease, stage 3 (moderate): Secondary | ICD-10-CM | POA: Diagnosis not present

## 2018-10-04 DIAGNOSIS — D631 Anemia in chronic kidney disease: Secondary | ICD-10-CM | POA: Diagnosis not present

## 2018-10-04 DIAGNOSIS — J9611 Chronic respiratory failure with hypoxia: Secondary | ICD-10-CM | POA: Diagnosis not present

## 2018-10-04 DIAGNOSIS — E1122 Type 2 diabetes mellitus with diabetic chronic kidney disease: Secondary | ICD-10-CM | POA: Diagnosis not present

## 2018-10-04 DIAGNOSIS — E1151 Type 2 diabetes mellitus with diabetic peripheral angiopathy without gangrene: Secondary | ICD-10-CM | POA: Diagnosis not present

## 2018-10-07 ENCOUNTER — Ambulatory Visit: Payer: Medicare HMO | Admitting: Surgery

## 2018-10-07 ENCOUNTER — Encounter (HOSPITAL_COMMUNITY): Payer: Medicare HMO

## 2018-10-07 ENCOUNTER — Other Ambulatory Visit (HOSPITAL_COMMUNITY): Payer: Medicare HMO

## 2018-10-07 DIAGNOSIS — N183 Chronic kidney disease, stage 3 (moderate): Secondary | ICD-10-CM | POA: Diagnosis not present

## 2018-10-07 DIAGNOSIS — E1122 Type 2 diabetes mellitus with diabetic chronic kidney disease: Secondary | ICD-10-CM | POA: Diagnosis not present

## 2018-10-07 DIAGNOSIS — I69120 Aphasia following nontraumatic intracerebral hemorrhage: Secondary | ICD-10-CM | POA: Diagnosis not present

## 2018-10-07 DIAGNOSIS — D631 Anemia in chronic kidney disease: Secondary | ICD-10-CM | POA: Diagnosis not present

## 2018-10-07 DIAGNOSIS — I509 Heart failure, unspecified: Secondary | ICD-10-CM | POA: Diagnosis not present

## 2018-10-07 DIAGNOSIS — I69151 Hemiplegia and hemiparesis following nontraumatic intracerebral hemorrhage affecting right dominant side: Secondary | ICD-10-CM | POA: Diagnosis not present

## 2018-10-07 DIAGNOSIS — J9611 Chronic respiratory failure with hypoxia: Secondary | ICD-10-CM | POA: Diagnosis not present

## 2018-10-07 DIAGNOSIS — E1151 Type 2 diabetes mellitus with diabetic peripheral angiopathy without gangrene: Secondary | ICD-10-CM | POA: Diagnosis not present

## 2018-10-07 DIAGNOSIS — I130 Hypertensive heart and chronic kidney disease with heart failure and stage 1 through stage 4 chronic kidney disease, or unspecified chronic kidney disease: Secondary | ICD-10-CM | POA: Diagnosis not present

## 2018-10-07 DIAGNOSIS — Z48811 Encounter for surgical aftercare following surgery on the nervous system: Secondary | ICD-10-CM | POA: Diagnosis not present

## 2018-10-09 DIAGNOSIS — I69120 Aphasia following nontraumatic intracerebral hemorrhage: Secondary | ICD-10-CM | POA: Diagnosis not present

## 2018-10-09 DIAGNOSIS — E1122 Type 2 diabetes mellitus with diabetic chronic kidney disease: Secondary | ICD-10-CM | POA: Diagnosis not present

## 2018-10-09 DIAGNOSIS — E1151 Type 2 diabetes mellitus with diabetic peripheral angiopathy without gangrene: Secondary | ICD-10-CM | POA: Diagnosis not present

## 2018-10-09 DIAGNOSIS — I130 Hypertensive heart and chronic kidney disease with heart failure and stage 1 through stage 4 chronic kidney disease, or unspecified chronic kidney disease: Secondary | ICD-10-CM | POA: Diagnosis not present

## 2018-10-09 DIAGNOSIS — J9611 Chronic respiratory failure with hypoxia: Secondary | ICD-10-CM | POA: Diagnosis not present

## 2018-10-09 DIAGNOSIS — D631 Anemia in chronic kidney disease: Secondary | ICD-10-CM | POA: Diagnosis not present

## 2018-10-09 DIAGNOSIS — N183 Chronic kidney disease, stage 3 (moderate): Secondary | ICD-10-CM | POA: Diagnosis not present

## 2018-10-09 DIAGNOSIS — Z48811 Encounter for surgical aftercare following surgery on the nervous system: Secondary | ICD-10-CM | POA: Diagnosis not present

## 2018-10-09 DIAGNOSIS — I69151 Hemiplegia and hemiparesis following nontraumatic intracerebral hemorrhage affecting right dominant side: Secondary | ICD-10-CM | POA: Diagnosis not present

## 2018-10-09 DIAGNOSIS — I509 Heart failure, unspecified: Secondary | ICD-10-CM | POA: Diagnosis not present

## 2018-10-10 DIAGNOSIS — I130 Hypertensive heart and chronic kidney disease with heart failure and stage 1 through stage 4 chronic kidney disease, or unspecified chronic kidney disease: Secondary | ICD-10-CM | POA: Diagnosis not present

## 2018-10-10 DIAGNOSIS — I509 Heart failure, unspecified: Secondary | ICD-10-CM | POA: Diagnosis not present

## 2018-10-10 DIAGNOSIS — I69120 Aphasia following nontraumatic intracerebral hemorrhage: Secondary | ICD-10-CM | POA: Diagnosis not present

## 2018-10-10 DIAGNOSIS — Z48811 Encounter for surgical aftercare following surgery on the nervous system: Secondary | ICD-10-CM | POA: Diagnosis not present

## 2018-10-10 DIAGNOSIS — D631 Anemia in chronic kidney disease: Secondary | ICD-10-CM | POA: Diagnosis not present

## 2018-10-10 DIAGNOSIS — E1122 Type 2 diabetes mellitus with diabetic chronic kidney disease: Secondary | ICD-10-CM | POA: Diagnosis not present

## 2018-10-10 DIAGNOSIS — J9611 Chronic respiratory failure with hypoxia: Secondary | ICD-10-CM | POA: Diagnosis not present

## 2018-10-10 DIAGNOSIS — N183 Chronic kidney disease, stage 3 (moderate): Secondary | ICD-10-CM | POA: Diagnosis not present

## 2018-10-10 DIAGNOSIS — E1151 Type 2 diabetes mellitus with diabetic peripheral angiopathy without gangrene: Secondary | ICD-10-CM | POA: Diagnosis not present

## 2018-10-10 DIAGNOSIS — I69151 Hemiplegia and hemiparesis following nontraumatic intracerebral hemorrhage affecting right dominant side: Secondary | ICD-10-CM | POA: Diagnosis not present

## 2018-10-11 DIAGNOSIS — D631 Anemia in chronic kidney disease: Secondary | ICD-10-CM | POA: Diagnosis not present

## 2018-10-11 DIAGNOSIS — Z48811 Encounter for surgical aftercare following surgery on the nervous system: Secondary | ICD-10-CM | POA: Diagnosis not present

## 2018-10-11 DIAGNOSIS — E1151 Type 2 diabetes mellitus with diabetic peripheral angiopathy without gangrene: Secondary | ICD-10-CM | POA: Diagnosis not present

## 2018-10-11 DIAGNOSIS — I69120 Aphasia following nontraumatic intracerebral hemorrhage: Secondary | ICD-10-CM | POA: Diagnosis not present

## 2018-10-11 DIAGNOSIS — N183 Chronic kidney disease, stage 3 (moderate): Secondary | ICD-10-CM | POA: Diagnosis not present

## 2018-10-11 DIAGNOSIS — I509 Heart failure, unspecified: Secondary | ICD-10-CM | POA: Diagnosis not present

## 2018-10-11 DIAGNOSIS — E1122 Type 2 diabetes mellitus with diabetic chronic kidney disease: Secondary | ICD-10-CM | POA: Diagnosis not present

## 2018-10-11 DIAGNOSIS — J9611 Chronic respiratory failure with hypoxia: Secondary | ICD-10-CM | POA: Diagnosis not present

## 2018-10-11 DIAGNOSIS — I130 Hypertensive heart and chronic kidney disease with heart failure and stage 1 through stage 4 chronic kidney disease, or unspecified chronic kidney disease: Secondary | ICD-10-CM | POA: Diagnosis not present

## 2018-10-11 DIAGNOSIS — I69151 Hemiplegia and hemiparesis following nontraumatic intracerebral hemorrhage affecting right dominant side: Secondary | ICD-10-CM | POA: Diagnosis not present

## 2018-10-14 DIAGNOSIS — E1122 Type 2 diabetes mellitus with diabetic chronic kidney disease: Secondary | ICD-10-CM | POA: Diagnosis not present

## 2018-10-14 DIAGNOSIS — D631 Anemia in chronic kidney disease: Secondary | ICD-10-CM | POA: Diagnosis not present

## 2018-10-14 DIAGNOSIS — J9611 Chronic respiratory failure with hypoxia: Secondary | ICD-10-CM | POA: Diagnosis not present

## 2018-10-14 DIAGNOSIS — I509 Heart failure, unspecified: Secondary | ICD-10-CM | POA: Diagnosis not present

## 2018-10-14 DIAGNOSIS — I130 Hypertensive heart and chronic kidney disease with heart failure and stage 1 through stage 4 chronic kidney disease, or unspecified chronic kidney disease: Secondary | ICD-10-CM | POA: Diagnosis not present

## 2018-10-14 DIAGNOSIS — I69120 Aphasia following nontraumatic intracerebral hemorrhage: Secondary | ICD-10-CM | POA: Diagnosis not present

## 2018-10-14 DIAGNOSIS — Z48811 Encounter for surgical aftercare following surgery on the nervous system: Secondary | ICD-10-CM | POA: Diagnosis not present

## 2018-10-14 DIAGNOSIS — E1151 Type 2 diabetes mellitus with diabetic peripheral angiopathy without gangrene: Secondary | ICD-10-CM | POA: Diagnosis not present

## 2018-10-14 DIAGNOSIS — I69151 Hemiplegia and hemiparesis following nontraumatic intracerebral hemorrhage affecting right dominant side: Secondary | ICD-10-CM | POA: Diagnosis not present

## 2018-10-14 DIAGNOSIS — N183 Chronic kidney disease, stage 3 (moderate): Secondary | ICD-10-CM | POA: Diagnosis not present

## 2018-10-15 DIAGNOSIS — I69151 Hemiplegia and hemiparesis following nontraumatic intracerebral hemorrhage affecting right dominant side: Secondary | ICD-10-CM | POA: Diagnosis not present

## 2018-10-15 DIAGNOSIS — N183 Chronic kidney disease, stage 3 (moderate): Secondary | ICD-10-CM | POA: Diagnosis not present

## 2018-10-15 DIAGNOSIS — E1151 Type 2 diabetes mellitus with diabetic peripheral angiopathy without gangrene: Secondary | ICD-10-CM | POA: Diagnosis not present

## 2018-10-15 DIAGNOSIS — I69120 Aphasia following nontraumatic intracerebral hemorrhage: Secondary | ICD-10-CM | POA: Diagnosis not present

## 2018-10-15 DIAGNOSIS — D631 Anemia in chronic kidney disease: Secondary | ICD-10-CM | POA: Diagnosis not present

## 2018-10-15 DIAGNOSIS — I509 Heart failure, unspecified: Secondary | ICD-10-CM | POA: Diagnosis not present

## 2018-10-15 DIAGNOSIS — I130 Hypertensive heart and chronic kidney disease with heart failure and stage 1 through stage 4 chronic kidney disease, or unspecified chronic kidney disease: Secondary | ICD-10-CM | POA: Diagnosis not present

## 2018-10-15 DIAGNOSIS — Z48811 Encounter for surgical aftercare following surgery on the nervous system: Secondary | ICD-10-CM | POA: Diagnosis not present

## 2018-10-15 DIAGNOSIS — J9611 Chronic respiratory failure with hypoxia: Secondary | ICD-10-CM | POA: Diagnosis not present

## 2018-10-15 DIAGNOSIS — E1122 Type 2 diabetes mellitus with diabetic chronic kidney disease: Secondary | ICD-10-CM | POA: Diagnosis not present

## 2018-10-16 DIAGNOSIS — E1151 Type 2 diabetes mellitus with diabetic peripheral angiopathy without gangrene: Secondary | ICD-10-CM | POA: Diagnosis not present

## 2018-10-16 DIAGNOSIS — I69120 Aphasia following nontraumatic intracerebral hemorrhage: Secondary | ICD-10-CM | POA: Diagnosis not present

## 2018-10-16 DIAGNOSIS — D631 Anemia in chronic kidney disease: Secondary | ICD-10-CM | POA: Diagnosis not present

## 2018-10-16 DIAGNOSIS — N183 Chronic kidney disease, stage 3 (moderate): Secondary | ICD-10-CM | POA: Diagnosis not present

## 2018-10-16 DIAGNOSIS — I69151 Hemiplegia and hemiparesis following nontraumatic intracerebral hemorrhage affecting right dominant side: Secondary | ICD-10-CM | POA: Diagnosis not present

## 2018-10-16 DIAGNOSIS — I509 Heart failure, unspecified: Secondary | ICD-10-CM | POA: Diagnosis not present

## 2018-10-16 DIAGNOSIS — J9611 Chronic respiratory failure with hypoxia: Secondary | ICD-10-CM | POA: Diagnosis not present

## 2018-10-16 DIAGNOSIS — Z48811 Encounter for surgical aftercare following surgery on the nervous system: Secondary | ICD-10-CM | POA: Diagnosis not present

## 2018-10-16 DIAGNOSIS — I130 Hypertensive heart and chronic kidney disease with heart failure and stage 1 through stage 4 chronic kidney disease, or unspecified chronic kidney disease: Secondary | ICD-10-CM | POA: Diagnosis not present

## 2018-10-16 DIAGNOSIS — E1122 Type 2 diabetes mellitus with diabetic chronic kidney disease: Secondary | ICD-10-CM | POA: Diagnosis not present

## 2018-10-18 DIAGNOSIS — N183 Chronic kidney disease, stage 3 (moderate): Secondary | ICD-10-CM | POA: Diagnosis not present

## 2018-10-18 DIAGNOSIS — I130 Hypertensive heart and chronic kidney disease with heart failure and stage 1 through stage 4 chronic kidney disease, or unspecified chronic kidney disease: Secondary | ICD-10-CM | POA: Diagnosis not present

## 2018-10-18 DIAGNOSIS — I69151 Hemiplegia and hemiparesis following nontraumatic intracerebral hemorrhage affecting right dominant side: Secondary | ICD-10-CM | POA: Diagnosis not present

## 2018-10-18 DIAGNOSIS — E1151 Type 2 diabetes mellitus with diabetic peripheral angiopathy without gangrene: Secondary | ICD-10-CM | POA: Diagnosis not present

## 2018-10-18 DIAGNOSIS — E1122 Type 2 diabetes mellitus with diabetic chronic kidney disease: Secondary | ICD-10-CM | POA: Diagnosis not present

## 2018-10-18 DIAGNOSIS — J9611 Chronic respiratory failure with hypoxia: Secondary | ICD-10-CM | POA: Diagnosis not present

## 2018-10-18 DIAGNOSIS — I509 Heart failure, unspecified: Secondary | ICD-10-CM | POA: Diagnosis not present

## 2018-10-18 DIAGNOSIS — Z48811 Encounter for surgical aftercare following surgery on the nervous system: Secondary | ICD-10-CM | POA: Diagnosis not present

## 2018-10-18 DIAGNOSIS — I69120 Aphasia following nontraumatic intracerebral hemorrhage: Secondary | ICD-10-CM | POA: Diagnosis not present

## 2018-10-18 DIAGNOSIS — D631 Anemia in chronic kidney disease: Secondary | ICD-10-CM | POA: Diagnosis not present

## 2018-10-21 DIAGNOSIS — J9611 Chronic respiratory failure with hypoxia: Secondary | ICD-10-CM | POA: Diagnosis not present

## 2018-10-21 DIAGNOSIS — I69120 Aphasia following nontraumatic intracerebral hemorrhage: Secondary | ICD-10-CM | POA: Diagnosis not present

## 2018-10-21 DIAGNOSIS — D631 Anemia in chronic kidney disease: Secondary | ICD-10-CM | POA: Diagnosis not present

## 2018-10-21 DIAGNOSIS — I69151 Hemiplegia and hemiparesis following nontraumatic intracerebral hemorrhage affecting right dominant side: Secondary | ICD-10-CM | POA: Diagnosis not present

## 2018-10-21 DIAGNOSIS — E1151 Type 2 diabetes mellitus with diabetic peripheral angiopathy without gangrene: Secondary | ICD-10-CM | POA: Diagnosis not present

## 2018-10-21 DIAGNOSIS — E1122 Type 2 diabetes mellitus with diabetic chronic kidney disease: Secondary | ICD-10-CM | POA: Diagnosis not present

## 2018-10-21 DIAGNOSIS — Z48811 Encounter for surgical aftercare following surgery on the nervous system: Secondary | ICD-10-CM | POA: Diagnosis not present

## 2018-10-21 DIAGNOSIS — N183 Chronic kidney disease, stage 3 (moderate): Secondary | ICD-10-CM | POA: Diagnosis not present

## 2018-10-21 DIAGNOSIS — I509 Heart failure, unspecified: Secondary | ICD-10-CM | POA: Diagnosis not present

## 2018-10-21 DIAGNOSIS — I130 Hypertensive heart and chronic kidney disease with heart failure and stage 1 through stage 4 chronic kidney disease, or unspecified chronic kidney disease: Secondary | ICD-10-CM | POA: Diagnosis not present

## 2018-10-23 DIAGNOSIS — I130 Hypertensive heart and chronic kidney disease with heart failure and stage 1 through stage 4 chronic kidney disease, or unspecified chronic kidney disease: Secondary | ICD-10-CM | POA: Diagnosis not present

## 2018-10-23 DIAGNOSIS — D631 Anemia in chronic kidney disease: Secondary | ICD-10-CM | POA: Diagnosis not present

## 2018-10-23 DIAGNOSIS — I69120 Aphasia following nontraumatic intracerebral hemorrhage: Secondary | ICD-10-CM | POA: Diagnosis not present

## 2018-10-23 DIAGNOSIS — Z48811 Encounter for surgical aftercare following surgery on the nervous system: Secondary | ICD-10-CM | POA: Diagnosis not present

## 2018-10-23 DIAGNOSIS — J9611 Chronic respiratory failure with hypoxia: Secondary | ICD-10-CM | POA: Diagnosis not present

## 2018-10-23 DIAGNOSIS — N183 Chronic kidney disease, stage 3 (moderate): Secondary | ICD-10-CM | POA: Diagnosis not present

## 2018-10-23 DIAGNOSIS — I509 Heart failure, unspecified: Secondary | ICD-10-CM | POA: Diagnosis not present

## 2018-10-23 DIAGNOSIS — E1122 Type 2 diabetes mellitus with diabetic chronic kidney disease: Secondary | ICD-10-CM | POA: Diagnosis not present

## 2018-10-23 DIAGNOSIS — E1151 Type 2 diabetes mellitus with diabetic peripheral angiopathy without gangrene: Secondary | ICD-10-CM | POA: Diagnosis not present

## 2018-10-23 DIAGNOSIS — I69151 Hemiplegia and hemiparesis following nontraumatic intracerebral hemorrhage affecting right dominant side: Secondary | ICD-10-CM | POA: Diagnosis not present

## 2018-10-24 DIAGNOSIS — J9611 Chronic respiratory failure with hypoxia: Secondary | ICD-10-CM | POA: Diagnosis not present

## 2018-10-24 DIAGNOSIS — D631 Anemia in chronic kidney disease: Secondary | ICD-10-CM | POA: Diagnosis not present

## 2018-10-24 DIAGNOSIS — I509 Heart failure, unspecified: Secondary | ICD-10-CM | POA: Diagnosis not present

## 2018-10-24 DIAGNOSIS — I130 Hypertensive heart and chronic kidney disease with heart failure and stage 1 through stage 4 chronic kidney disease, or unspecified chronic kidney disease: Secondary | ICD-10-CM | POA: Diagnosis not present

## 2018-10-24 DIAGNOSIS — I69120 Aphasia following nontraumatic intracerebral hemorrhage: Secondary | ICD-10-CM | POA: Diagnosis not present

## 2018-10-24 DIAGNOSIS — Z48811 Encounter for surgical aftercare following surgery on the nervous system: Secondary | ICD-10-CM | POA: Diagnosis not present

## 2018-10-24 DIAGNOSIS — N183 Chronic kidney disease, stage 3 (moderate): Secondary | ICD-10-CM | POA: Diagnosis not present

## 2018-10-24 DIAGNOSIS — E1122 Type 2 diabetes mellitus with diabetic chronic kidney disease: Secondary | ICD-10-CM | POA: Diagnosis not present

## 2018-10-24 DIAGNOSIS — I69151 Hemiplegia and hemiparesis following nontraumatic intracerebral hemorrhage affecting right dominant side: Secondary | ICD-10-CM | POA: Diagnosis not present

## 2018-10-24 DIAGNOSIS — E1151 Type 2 diabetes mellitus with diabetic peripheral angiopathy without gangrene: Secondary | ICD-10-CM | POA: Diagnosis not present

## 2018-10-25 DIAGNOSIS — I69151 Hemiplegia and hemiparesis following nontraumatic intracerebral hemorrhage affecting right dominant side: Secondary | ICD-10-CM | POA: Diagnosis not present

## 2018-10-25 DIAGNOSIS — I69120 Aphasia following nontraumatic intracerebral hemorrhage: Secondary | ICD-10-CM | POA: Diagnosis not present

## 2018-10-25 DIAGNOSIS — N183 Chronic kidney disease, stage 3 (moderate): Secondary | ICD-10-CM | POA: Diagnosis not present

## 2018-10-25 DIAGNOSIS — I509 Heart failure, unspecified: Secondary | ICD-10-CM | POA: Diagnosis not present

## 2018-10-25 DIAGNOSIS — E1122 Type 2 diabetes mellitus with diabetic chronic kidney disease: Secondary | ICD-10-CM | POA: Diagnosis not present

## 2018-10-25 DIAGNOSIS — E1151 Type 2 diabetes mellitus with diabetic peripheral angiopathy without gangrene: Secondary | ICD-10-CM | POA: Diagnosis not present

## 2018-10-25 DIAGNOSIS — I130 Hypertensive heart and chronic kidney disease with heart failure and stage 1 through stage 4 chronic kidney disease, or unspecified chronic kidney disease: Secondary | ICD-10-CM | POA: Diagnosis not present

## 2018-10-25 DIAGNOSIS — Z48811 Encounter for surgical aftercare following surgery on the nervous system: Secondary | ICD-10-CM | POA: Diagnosis not present

## 2018-10-25 DIAGNOSIS — J9611 Chronic respiratory failure with hypoxia: Secondary | ICD-10-CM | POA: Diagnosis not present

## 2018-10-25 DIAGNOSIS — D631 Anemia in chronic kidney disease: Secondary | ICD-10-CM | POA: Diagnosis not present

## 2018-10-28 DIAGNOSIS — I130 Hypertensive heart and chronic kidney disease with heart failure and stage 1 through stage 4 chronic kidney disease, or unspecified chronic kidney disease: Secondary | ICD-10-CM | POA: Diagnosis not present

## 2018-10-28 DIAGNOSIS — E1151 Type 2 diabetes mellitus with diabetic peripheral angiopathy without gangrene: Secondary | ICD-10-CM | POA: Diagnosis not present

## 2018-10-28 DIAGNOSIS — I69120 Aphasia following nontraumatic intracerebral hemorrhage: Secondary | ICD-10-CM | POA: Diagnosis not present

## 2018-10-28 DIAGNOSIS — I69151 Hemiplegia and hemiparesis following nontraumatic intracerebral hemorrhage affecting right dominant side: Secondary | ICD-10-CM | POA: Diagnosis not present

## 2018-10-28 DIAGNOSIS — Z48811 Encounter for surgical aftercare following surgery on the nervous system: Secondary | ICD-10-CM | POA: Diagnosis not present

## 2018-10-28 DIAGNOSIS — N183 Chronic kidney disease, stage 3 (moderate): Secondary | ICD-10-CM | POA: Diagnosis not present

## 2018-10-28 DIAGNOSIS — D631 Anemia in chronic kidney disease: Secondary | ICD-10-CM | POA: Diagnosis not present

## 2018-10-28 DIAGNOSIS — J9611 Chronic respiratory failure with hypoxia: Secondary | ICD-10-CM | POA: Diagnosis not present

## 2018-10-28 DIAGNOSIS — I509 Heart failure, unspecified: Secondary | ICD-10-CM | POA: Diagnosis not present

## 2018-10-28 DIAGNOSIS — E1122 Type 2 diabetes mellitus with diabetic chronic kidney disease: Secondary | ICD-10-CM | POA: Diagnosis not present

## 2018-10-30 DIAGNOSIS — D631 Anemia in chronic kidney disease: Secondary | ICD-10-CM | POA: Diagnosis not present

## 2018-10-30 DIAGNOSIS — E1151 Type 2 diabetes mellitus with diabetic peripheral angiopathy without gangrene: Secondary | ICD-10-CM | POA: Diagnosis not present

## 2018-10-30 DIAGNOSIS — J9611 Chronic respiratory failure with hypoxia: Secondary | ICD-10-CM | POA: Diagnosis not present

## 2018-10-30 DIAGNOSIS — N183 Chronic kidney disease, stage 3 (moderate): Secondary | ICD-10-CM | POA: Diagnosis not present

## 2018-10-30 DIAGNOSIS — I69120 Aphasia following nontraumatic intracerebral hemorrhage: Secondary | ICD-10-CM | POA: Diagnosis not present

## 2018-10-30 DIAGNOSIS — I509 Heart failure, unspecified: Secondary | ICD-10-CM | POA: Diagnosis not present

## 2018-10-30 DIAGNOSIS — I69151 Hemiplegia and hemiparesis following nontraumatic intracerebral hemorrhage affecting right dominant side: Secondary | ICD-10-CM | POA: Diagnosis not present

## 2018-10-30 DIAGNOSIS — I130 Hypertensive heart and chronic kidney disease with heart failure and stage 1 through stage 4 chronic kidney disease, or unspecified chronic kidney disease: Secondary | ICD-10-CM | POA: Diagnosis not present

## 2018-10-30 DIAGNOSIS — E1122 Type 2 diabetes mellitus with diabetic chronic kidney disease: Secondary | ICD-10-CM | POA: Diagnosis not present

## 2018-10-30 DIAGNOSIS — Z48811 Encounter for surgical aftercare following surgery on the nervous system: Secondary | ICD-10-CM | POA: Diagnosis not present

## 2018-10-31 ENCOUNTER — Other Ambulatory Visit: Payer: Self-pay

## 2018-10-31 ENCOUNTER — Encounter: Payer: Self-pay | Admitting: Physical Medicine & Rehabilitation

## 2018-10-31 ENCOUNTER — Encounter: Payer: Medicare HMO | Attending: Registered Nurse | Admitting: Physical Medicine & Rehabilitation

## 2018-10-31 VITALS — BP 130/79 | HR 87 | Temp 97.5°F | Ht 67.5 in | Wt 124.0 lb

## 2018-10-31 DIAGNOSIS — E1122 Type 2 diabetes mellitus with diabetic chronic kidney disease: Secondary | ICD-10-CM | POA: Diagnosis not present

## 2018-10-31 DIAGNOSIS — I69120 Aphasia following nontraumatic intracerebral hemorrhage: Secondary | ICD-10-CM | POA: Diagnosis not present

## 2018-10-31 DIAGNOSIS — E119 Type 2 diabetes mellitus without complications: Secondary | ICD-10-CM | POA: Diagnosis not present

## 2018-10-31 DIAGNOSIS — I509 Heart failure, unspecified: Secondary | ICD-10-CM | POA: Diagnosis not present

## 2018-10-31 DIAGNOSIS — F802 Mixed receptive-expressive language disorder: Secondary | ICD-10-CM

## 2018-10-31 DIAGNOSIS — D631 Anemia in chronic kidney disease: Secondary | ICD-10-CM | POA: Diagnosis not present

## 2018-10-31 DIAGNOSIS — E1151 Type 2 diabetes mellitus with diabetic peripheral angiopathy without gangrene: Secondary | ICD-10-CM | POA: Diagnosis not present

## 2018-10-31 DIAGNOSIS — Z931 Gastrostomy status: Secondary | ICD-10-CM | POA: Diagnosis not present

## 2018-10-31 DIAGNOSIS — N183 Chronic kidney disease, stage 3 (moderate): Secondary | ICD-10-CM | POA: Diagnosis not present

## 2018-10-31 DIAGNOSIS — I10 Essential (primary) hypertension: Secondary | ICD-10-CM | POA: Diagnosis not present

## 2018-10-31 DIAGNOSIS — I130 Hypertensive heart and chronic kidney disease with heart failure and stage 1 through stage 4 chronic kidney disease, or unspecified chronic kidney disease: Secondary | ICD-10-CM | POA: Diagnosis not present

## 2018-10-31 DIAGNOSIS — R4701 Aphasia: Secondary | ICD-10-CM | POA: Diagnosis not present

## 2018-10-31 DIAGNOSIS — Z48811 Encounter for surgical aftercare following surgery on the nervous system: Secondary | ICD-10-CM | POA: Diagnosis not present

## 2018-10-31 DIAGNOSIS — R414 Neurologic neglect syndrome: Secondary | ICD-10-CM | POA: Diagnosis not present

## 2018-10-31 DIAGNOSIS — I611 Nontraumatic intracerebral hemorrhage in hemisphere, cortical: Secondary | ICD-10-CM | POA: Diagnosis not present

## 2018-10-31 DIAGNOSIS — I69390 Apraxia following cerebral infarction: Secondary | ICD-10-CM | POA: Diagnosis not present

## 2018-10-31 DIAGNOSIS — J9611 Chronic respiratory failure with hypoxia: Secondary | ICD-10-CM | POA: Diagnosis not present

## 2018-10-31 DIAGNOSIS — I69151 Hemiplegia and hemiparesis following nontraumatic intracerebral hemorrhage affecting right dominant side: Secondary | ICD-10-CM | POA: Diagnosis not present

## 2018-10-31 NOTE — Patient Instructions (Addendum)
Visual agnosia  Color agnosia  Cannot stay by yourself  Please do as much therapy as possible

## 2018-10-31 NOTE — Progress Notes (Signed)
Subjective:    Patient ID: Mark [name], male    DOB: [birth year], 68 y.o.   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  He underwent left craniectomy for evacuation of hematoma after partial embolization of AVM.  He was taken back to the OR on 521/2020 for resection of residual AVM with cranioplasty and PEG placement.  Trach placed on 07/20/2018 due to difficulty with vent wean with bouts of agitation as well as difficulty managing secretions.  He was reported to have issues with somnolence, right hemiparesis as well as aphasia.     He was transferred to [facility] on 08/01/18 for medical management and vent wean. Agitation has improved with use of Klonopin and he tolerated vent wean with plugging.  He was seizure-free on Keppra.  Provigil and amantadine were added to help with attention activation.  Activation.  He was kept n.p.o. with tube feeds for nutritional support due to signs of dysphagia.  He was showing improvement in activity tolerance with ability to follow some commands.   HPI Admit date: 08/21/2018 Discharge date: 09/05/2018  HHPT finished HH Speech and OT continue  OT fine motor , visual scanning , estim Transitional movements May start simple meal prep and med management soon Speech continues to work with patient twice a week  This state patient is still walking into doorways on his right side.  This appears to be improving according to his daughter.  The patient's daughter and his son alternate staying with him and providing 24/7 supervision.  They cannot do this indefinitely but may be able to do it for a couple more months.  Home health agency is working on another 60days of approval  Pain Inventory Average Pain 3 Pain Right Now 0 My pain is aching and tightness  In the last 24 hours, has  pain interfered with the following? General activity 3 Relation with others 5 Enjoyment of life 5 What TIME of day is your pain at its worst? varies Sleep (in general) Good  Pain is worse with: na Pain improves with: na Relief from Meds: na  Mobility ability to climb steps?  yes do you drive?  no  Function not employed: date last employed 07/05/2018 retired I need assistance with the following:  meal prep, household duties and shopping  Neuro/Psych weakness confusion  Prior Studies Any changes since last visit?  no  Physicians involved in your care Any changes since last visit?  no Primary care . Neurologist . Neurosurgeon .   Family History  Problem Relation Age of Onset  . Dementia Mother   . Diabetes Mother   . Heart disease Mother   . Diabetes Father   . Heart disease Father    Social History   Socioeconomic History  . Marital status: Widowed    Spouse name: Not on file  . Number of children: Not on file  . Years of education: Not on file  . Highest education level: Not on file  Occupational History  . Not on file  Social Needs  . Financial resource strain: Not on file  . Food insecurity    Worry: Not on file    Inability: Not on file  . Transportation needs    Medical: Not on file    Non-medical: Not on file  Tobacco Use  . Smoking status: Current  Every Day Smoker    Packs/day: 1.50    Types: Cigarettes  . Smokeless tobacco: Never Used  . Tobacco comment: wants patches in hospital  Substance and Sexual Activity  . Alcohol use: No  . Drug use: Never  . Sexual activity: Not on file  Lifestyle  . Physical activity    Days per week: Not on file    Minutes per session: Not on file  . Stress: Not on file  Relationships  . Social Herbalist on phone: Not on file    Gets together: Not on file    Attends religious service: Not on file    Active member of club or organization: Not on file    Attends meetings of clubs or  organizations: Not on file    Relationship status: Not on file  Other Topics Concern  . Not on file  Social History Narrative  . Not on file   Past Surgical History:  Procedure Laterality Date  . ABDOMINAL AORTIC ENDOVASCULAR STENT GRAFT N/A 02/01/2018   Procedure: ABDOMINAL AORTIC ENDOVASCULAR STENT GRAFT;  Surgeon: Serafina Mitchell, MD;  Location: [place];  Service: Vascular;  Laterality: N/A;  . NECK SURGERY    . VEIN BYPASS SURGERY     Past Medical History:  Diagnosis Date  . Acute on chronic respiratory failure with hypoxia ([place])   . Altered mental status, unspecified   . Chronic kidney disease    CKD stage 3  . Chronic kidney disease, stage III (moderate) (HCC)   . Concussion    multiple in high school  . Coronary artery disease    pt denies  . Diabetes mellitus without complication (HCC)    borderline  . Hypertension   . Nontraumatic intracranial hemorrhage, unspecified ([place])   . PVD (peripheral vascular disease) (HCC)    leg stents femoral artery  . Stroke ([place])    3 years ago- no residual   BP 130/79   Pulse 87   Temp (!) 97.5 F (36.4 C)   Ht 5' 7.5" (1.715 m)   Wt 124 lb (56.2 kg)   SpO2 94%   BMI 19.13 kg/m   Opioid Risk Score:   Fall Risk Score:  `1  Depression screen PHQ 2/9  No flowsheet data found. Review of Systems  Constitutional: Negative.   HENT: Negative.   Eyes: Positive for visual disturbance.  Respiratory: Negative.   Cardiovascular: Negative.   Gastrointestinal: Negative.   Endocrine: Negative.   Genitourinary: Negative.   Musculoskeletal: Negative.   Skin: Negative.   Allergic/Immunologic: Negative.   Neurological: Positive for weakness.  Hematological: Negative.   Psychiatric/Behavioral: Negative.   All other systems reviewed and are negative.      Objective:   Physical Exam Vitals signs and nursing note reviewed.  Constitutional:      Appearance: Normal appearance.  Eyes:     Extraocular Movements: Extraocular  movements intact.     Conjunctiva/sclera: Conjunctivae normal.     Pupils: Pupils are equal, round, and reactive to light.  Neck:     Musculoskeletal: Normal range of motion.  Pulmonary:     Effort: Pulmonary effort is normal. No respiratory distress.  Musculoskeletal: Normal range of motion.        General: No deformity.     Right lower leg: No edema.     Left lower leg: No edema.  Skin:    General: Skin is warm and dry.  Neurological:     Mental Status:  He is alert.     Cranial Nerves: No facial asymmetry.     Gait: Tandem walk abnormal.     Comments: Orientation cannot be assessed secondary to aphasia. The patient has receptive aphasia, fluent expressive language deficits with jargon  After repeated instructions he is able to state his name.  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  Patient requires gestural cues repeated  Difficult to assess sensation due to aphasia.  Although he indicates that touching both hands feels the same  The patient cannot cooperate with visual field testing  Patient exhibits poor awareness of deficits.  Standard gait is intact, able to do toe walk heel walk but needs visual cueing.  Unable to perform tandem gait           Assessment & Plan:  1.  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  In addition he has right neglect and possible right field cut. He continues require home health OT and speech, will likely need to follow this with some outpatient therapy. He is unable to stay by himself and requires 24/7 supervision at this time. We discussed the patient's deficits with the daughter.  She also indicates he has difficulty with colors.  We discussed that this may be explained by a visual agnosia which would be common with the location of his bleed. Given the difficulty with assessment, would hold off on neuro-ophthalmology  evaluation for another couple months. We discussed time course of recovery and of plateau at approximately 1 year post bleed  Over half of the 25 min visit was spent counseling and coordinating care.

## 2018-11-05 DIAGNOSIS — N183 Chronic kidney disease, stage 3 (moderate): Secondary | ICD-10-CM | POA: Diagnosis not present

## 2018-11-05 DIAGNOSIS — I69120 Aphasia following nontraumatic intracerebral hemorrhage: Secondary | ICD-10-CM | POA: Diagnosis not present

## 2018-11-05 DIAGNOSIS — Z48811 Encounter for surgical aftercare following surgery on the nervous system: Secondary | ICD-10-CM | POA: Diagnosis not present

## 2018-11-05 DIAGNOSIS — J9611 Chronic respiratory failure with hypoxia: Secondary | ICD-10-CM | POA: Diagnosis not present

## 2018-11-05 DIAGNOSIS — D631 Anemia in chronic kidney disease: Secondary | ICD-10-CM | POA: Diagnosis not present

## 2018-11-05 DIAGNOSIS — E1122 Type 2 diabetes mellitus with diabetic chronic kidney disease: Secondary | ICD-10-CM | POA: Diagnosis not present

## 2018-11-05 DIAGNOSIS — E1151 Type 2 diabetes mellitus with diabetic peripheral angiopathy without gangrene: Secondary | ICD-10-CM | POA: Diagnosis not present

## 2018-11-05 DIAGNOSIS — I69151 Hemiplegia and hemiparesis following nontraumatic intracerebral hemorrhage affecting right dominant side: Secondary | ICD-10-CM | POA: Diagnosis not present

## 2018-11-05 DIAGNOSIS — R69 Illness, unspecified: Secondary | ICD-10-CM | POA: Diagnosis not present

## 2018-11-05 DIAGNOSIS — I130 Hypertensive heart and chronic kidney disease with heart failure and stage 1 through stage 4 chronic kidney disease, or unspecified chronic kidney disease: Secondary | ICD-10-CM | POA: Diagnosis not present

## 2018-11-05 DIAGNOSIS — I509 Heart failure, unspecified: Secondary | ICD-10-CM | POA: Diagnosis not present

## 2018-11-06 DIAGNOSIS — I69120 Aphasia following nontraumatic intracerebral hemorrhage: Secondary | ICD-10-CM | POA: Diagnosis not present

## 2018-11-06 DIAGNOSIS — E1122 Type 2 diabetes mellitus with diabetic chronic kidney disease: Secondary | ICD-10-CM | POA: Diagnosis not present

## 2018-11-06 DIAGNOSIS — I509 Heart failure, unspecified: Secondary | ICD-10-CM | POA: Diagnosis not present

## 2018-11-06 DIAGNOSIS — J9611 Chronic respiratory failure with hypoxia: Secondary | ICD-10-CM | POA: Diagnosis not present

## 2018-11-06 DIAGNOSIS — I130 Hypertensive heart and chronic kidney disease with heart failure and stage 1 through stage 4 chronic kidney disease, or unspecified chronic kidney disease: Secondary | ICD-10-CM | POA: Diagnosis not present

## 2018-11-06 DIAGNOSIS — I69151 Hemiplegia and hemiparesis following nontraumatic intracerebral hemorrhage affecting right dominant side: Secondary | ICD-10-CM | POA: Diagnosis not present

## 2018-11-06 DIAGNOSIS — Z48811 Encounter for surgical aftercare following surgery on the nervous system: Secondary | ICD-10-CM | POA: Diagnosis not present

## 2018-11-06 DIAGNOSIS — N183 Chronic kidney disease, stage 3 (moderate): Secondary | ICD-10-CM | POA: Diagnosis not present

## 2018-11-06 DIAGNOSIS — D631 Anemia in chronic kidney disease: Secondary | ICD-10-CM | POA: Diagnosis not present

## 2018-11-06 DIAGNOSIS — E1151 Type 2 diabetes mellitus with diabetic peripheral angiopathy without gangrene: Secondary | ICD-10-CM | POA: Diagnosis not present

## 2018-11-07 DIAGNOSIS — I130 Hypertensive heart and chronic kidney disease with heart failure and stage 1 through stage 4 chronic kidney disease, or unspecified chronic kidney disease: Secondary | ICD-10-CM | POA: Diagnosis not present

## 2018-11-07 DIAGNOSIS — E1122 Type 2 diabetes mellitus with diabetic chronic kidney disease: Secondary | ICD-10-CM | POA: Diagnosis not present

## 2018-11-07 DIAGNOSIS — E1151 Type 2 diabetes mellitus with diabetic peripheral angiopathy without gangrene: Secondary | ICD-10-CM | POA: Diagnosis not present

## 2018-11-07 DIAGNOSIS — D631 Anemia in chronic kidney disease: Secondary | ICD-10-CM | POA: Diagnosis not present

## 2018-11-07 DIAGNOSIS — I69120 Aphasia following nontraumatic intracerebral hemorrhage: Secondary | ICD-10-CM | POA: Diagnosis not present

## 2018-11-07 DIAGNOSIS — N183 Chronic kidney disease, stage 3 (moderate): Secondary | ICD-10-CM | POA: Diagnosis not present

## 2018-11-07 DIAGNOSIS — Z48811 Encounter for surgical aftercare following surgery on the nervous system: Secondary | ICD-10-CM | POA: Diagnosis not present

## 2018-11-07 DIAGNOSIS — I509 Heart failure, unspecified: Secondary | ICD-10-CM | POA: Diagnosis not present

## 2018-11-07 DIAGNOSIS — J9611 Chronic respiratory failure with hypoxia: Secondary | ICD-10-CM | POA: Diagnosis not present

## 2018-11-07 DIAGNOSIS — I69151 Hemiplegia and hemiparesis following nontraumatic intracerebral hemorrhage affecting right dominant side: Secondary | ICD-10-CM | POA: Diagnosis not present

## 2018-11-08 DIAGNOSIS — I509 Heart failure, unspecified: Secondary | ICD-10-CM | POA: Diagnosis not present

## 2018-11-08 DIAGNOSIS — N183 Chronic kidney disease, stage 3 (moderate): Secondary | ICD-10-CM | POA: Diagnosis not present

## 2018-11-08 DIAGNOSIS — Z48811 Encounter for surgical aftercare following surgery on the nervous system: Secondary | ICD-10-CM | POA: Diagnosis not present

## 2018-11-08 DIAGNOSIS — J9611 Chronic respiratory failure with hypoxia: Secondary | ICD-10-CM | POA: Diagnosis not present

## 2018-11-08 DIAGNOSIS — D631 Anemia in chronic kidney disease: Secondary | ICD-10-CM | POA: Diagnosis not present

## 2018-11-08 DIAGNOSIS — I69151 Hemiplegia and hemiparesis following nontraumatic intracerebral hemorrhage affecting right dominant side: Secondary | ICD-10-CM | POA: Diagnosis not present

## 2018-11-08 DIAGNOSIS — E1122 Type 2 diabetes mellitus with diabetic chronic kidney disease: Secondary | ICD-10-CM | POA: Diagnosis not present

## 2018-11-08 DIAGNOSIS — I69120 Aphasia following nontraumatic intracerebral hemorrhage: Secondary | ICD-10-CM | POA: Diagnosis not present

## 2018-11-08 DIAGNOSIS — E1151 Type 2 diabetes mellitus with diabetic peripheral angiopathy without gangrene: Secondary | ICD-10-CM | POA: Diagnosis not present

## 2018-11-08 DIAGNOSIS — I130 Hypertensive heart and chronic kidney disease with heart failure and stage 1 through stage 4 chronic kidney disease, or unspecified chronic kidney disease: Secondary | ICD-10-CM | POA: Diagnosis not present

## 2018-11-12 DIAGNOSIS — I509 Heart failure, unspecified: Secondary | ICD-10-CM | POA: Diagnosis not present

## 2018-11-12 DIAGNOSIS — E1122 Type 2 diabetes mellitus with diabetic chronic kidney disease: Secondary | ICD-10-CM | POA: Diagnosis not present

## 2018-11-12 DIAGNOSIS — J9611 Chronic respiratory failure with hypoxia: Secondary | ICD-10-CM | POA: Diagnosis not present

## 2018-11-12 DIAGNOSIS — I69120 Aphasia following nontraumatic intracerebral hemorrhage: Secondary | ICD-10-CM | POA: Diagnosis not present

## 2018-11-12 DIAGNOSIS — E1151 Type 2 diabetes mellitus with diabetic peripheral angiopathy without gangrene: Secondary | ICD-10-CM | POA: Diagnosis not present

## 2018-11-12 DIAGNOSIS — I130 Hypertensive heart and chronic kidney disease with heart failure and stage 1 through stage 4 chronic kidney disease, or unspecified chronic kidney disease: Secondary | ICD-10-CM | POA: Diagnosis not present

## 2018-11-12 DIAGNOSIS — I69151 Hemiplegia and hemiparesis following nontraumatic intracerebral hemorrhage affecting right dominant side: Secondary | ICD-10-CM | POA: Diagnosis not present

## 2018-11-12 DIAGNOSIS — Z48811 Encounter for surgical aftercare following surgery on the nervous system: Secondary | ICD-10-CM | POA: Diagnosis not present

## 2018-11-12 DIAGNOSIS — N183 Chronic kidney disease, stage 3 (moderate): Secondary | ICD-10-CM | POA: Diagnosis not present

## 2018-11-12 DIAGNOSIS — D631 Anemia in chronic kidney disease: Secondary | ICD-10-CM | POA: Diagnosis not present

## 2018-11-13 DIAGNOSIS — E1151 Type 2 diabetes mellitus with diabetic peripheral angiopathy without gangrene: Secondary | ICD-10-CM | POA: Diagnosis not present

## 2018-11-13 DIAGNOSIS — I69120 Aphasia following nontraumatic intracerebral hemorrhage: Secondary | ICD-10-CM | POA: Diagnosis not present

## 2018-11-13 DIAGNOSIS — I130 Hypertensive heart and chronic kidney disease with heart failure and stage 1 through stage 4 chronic kidney disease, or unspecified chronic kidney disease: Secondary | ICD-10-CM | POA: Diagnosis not present

## 2018-11-13 DIAGNOSIS — J9611 Chronic respiratory failure with hypoxia: Secondary | ICD-10-CM | POA: Diagnosis not present

## 2018-11-13 DIAGNOSIS — N183 Chronic kidney disease, stage 3 (moderate): Secondary | ICD-10-CM | POA: Diagnosis not present

## 2018-11-13 DIAGNOSIS — I509 Heart failure, unspecified: Secondary | ICD-10-CM | POA: Diagnosis not present

## 2018-11-13 DIAGNOSIS — I69151 Hemiplegia and hemiparesis following nontraumatic intracerebral hemorrhage affecting right dominant side: Secondary | ICD-10-CM | POA: Diagnosis not present

## 2018-11-13 DIAGNOSIS — D631 Anemia in chronic kidney disease: Secondary | ICD-10-CM | POA: Diagnosis not present

## 2018-11-13 DIAGNOSIS — Z48811 Encounter for surgical aftercare following surgery on the nervous system: Secondary | ICD-10-CM | POA: Diagnosis not present

## 2018-11-13 DIAGNOSIS — E1122 Type 2 diabetes mellitus with diabetic chronic kidney disease: Secondary | ICD-10-CM | POA: Diagnosis not present

## 2018-11-14 DIAGNOSIS — Z48811 Encounter for surgical aftercare following surgery on the nervous system: Secondary | ICD-10-CM | POA: Diagnosis not present

## 2018-11-14 DIAGNOSIS — J9611 Chronic respiratory failure with hypoxia: Secondary | ICD-10-CM | POA: Diagnosis not present

## 2018-11-14 DIAGNOSIS — E1122 Type 2 diabetes mellitus with diabetic chronic kidney disease: Secondary | ICD-10-CM | POA: Diagnosis not present

## 2018-11-14 DIAGNOSIS — I509 Heart failure, unspecified: Secondary | ICD-10-CM | POA: Diagnosis not present

## 2018-11-14 DIAGNOSIS — D631 Anemia in chronic kidney disease: Secondary | ICD-10-CM | POA: Diagnosis not present

## 2018-11-14 DIAGNOSIS — E1151 Type 2 diabetes mellitus with diabetic peripheral angiopathy without gangrene: Secondary | ICD-10-CM | POA: Diagnosis not present

## 2018-11-14 DIAGNOSIS — I130 Hypertensive heart and chronic kidney disease with heart failure and stage 1 through stage 4 chronic kidney disease, or unspecified chronic kidney disease: Secondary | ICD-10-CM | POA: Diagnosis not present

## 2018-11-14 DIAGNOSIS — I69120 Aphasia following nontraumatic intracerebral hemorrhage: Secondary | ICD-10-CM | POA: Diagnosis not present

## 2018-11-14 DIAGNOSIS — N183 Chronic kidney disease, stage 3 (moderate): Secondary | ICD-10-CM | POA: Diagnosis not present

## 2018-11-14 DIAGNOSIS — I69151 Hemiplegia and hemiparesis following nontraumatic intracerebral hemorrhage affecting right dominant side: Secondary | ICD-10-CM | POA: Diagnosis not present

## 2018-11-15 DIAGNOSIS — I69151 Hemiplegia and hemiparesis following nontraumatic intracerebral hemorrhage affecting right dominant side: Secondary | ICD-10-CM | POA: Diagnosis not present

## 2018-11-15 DIAGNOSIS — E1151 Type 2 diabetes mellitus with diabetic peripheral angiopathy without gangrene: Secondary | ICD-10-CM | POA: Diagnosis not present

## 2018-11-15 DIAGNOSIS — E1122 Type 2 diabetes mellitus with diabetic chronic kidney disease: Secondary | ICD-10-CM | POA: Diagnosis not present

## 2018-11-15 DIAGNOSIS — I69120 Aphasia following nontraumatic intracerebral hemorrhage: Secondary | ICD-10-CM | POA: Diagnosis not present

## 2018-11-15 DIAGNOSIS — I509 Heart failure, unspecified: Secondary | ICD-10-CM | POA: Diagnosis not present

## 2018-11-15 DIAGNOSIS — N183 Chronic kidney disease, stage 3 (moderate): Secondary | ICD-10-CM | POA: Diagnosis not present

## 2018-11-15 DIAGNOSIS — J9611 Chronic respiratory failure with hypoxia: Secondary | ICD-10-CM | POA: Diagnosis not present

## 2018-11-15 DIAGNOSIS — I130 Hypertensive heart and chronic kidney disease with heart failure and stage 1 through stage 4 chronic kidney disease, or unspecified chronic kidney disease: Secondary | ICD-10-CM | POA: Diagnosis not present

## 2018-11-15 DIAGNOSIS — Z48811 Encounter for surgical aftercare following surgery on the nervous system: Secondary | ICD-10-CM | POA: Diagnosis not present

## 2018-11-15 DIAGNOSIS — D631 Anemia in chronic kidney disease: Secondary | ICD-10-CM | POA: Diagnosis not present

## 2018-11-19 DIAGNOSIS — I69151 Hemiplegia and hemiparesis following nontraumatic intracerebral hemorrhage affecting right dominant side: Secondary | ICD-10-CM | POA: Diagnosis not present

## 2018-11-19 DIAGNOSIS — J9611 Chronic respiratory failure with hypoxia: Secondary | ICD-10-CM | POA: Diagnosis not present

## 2018-11-19 DIAGNOSIS — E1122 Type 2 diabetes mellitus with diabetic chronic kidney disease: Secondary | ICD-10-CM | POA: Diagnosis not present

## 2018-11-19 DIAGNOSIS — D631 Anemia in chronic kidney disease: Secondary | ICD-10-CM | POA: Diagnosis not present

## 2018-11-19 DIAGNOSIS — E1151 Type 2 diabetes mellitus with diabetic peripheral angiopathy without gangrene: Secondary | ICD-10-CM | POA: Diagnosis not present

## 2018-11-19 DIAGNOSIS — I69120 Aphasia following nontraumatic intracerebral hemorrhage: Secondary | ICD-10-CM | POA: Diagnosis not present

## 2018-11-19 DIAGNOSIS — Z48811 Encounter for surgical aftercare following surgery on the nervous system: Secondary | ICD-10-CM | POA: Diagnosis not present

## 2018-11-19 DIAGNOSIS — I509 Heart failure, unspecified: Secondary | ICD-10-CM | POA: Diagnosis not present

## 2018-11-19 DIAGNOSIS — N183 Chronic kidney disease, stage 3 (moderate): Secondary | ICD-10-CM | POA: Diagnosis not present

## 2018-11-19 DIAGNOSIS — I130 Hypertensive heart and chronic kidney disease with heart failure and stage 1 through stage 4 chronic kidney disease, or unspecified chronic kidney disease: Secondary | ICD-10-CM | POA: Diagnosis not present

## 2018-11-20 DIAGNOSIS — J9611 Chronic respiratory failure with hypoxia: Secondary | ICD-10-CM | POA: Diagnosis not present

## 2018-11-20 DIAGNOSIS — E1151 Type 2 diabetes mellitus with diabetic peripheral angiopathy without gangrene: Secondary | ICD-10-CM | POA: Diagnosis not present

## 2018-11-20 DIAGNOSIS — N183 Chronic kidney disease, stage 3 (moderate): Secondary | ICD-10-CM | POA: Diagnosis not present

## 2018-11-20 DIAGNOSIS — D631 Anemia in chronic kidney disease: Secondary | ICD-10-CM | POA: Diagnosis not present

## 2018-11-20 DIAGNOSIS — I69151 Hemiplegia and hemiparesis following nontraumatic intracerebral hemorrhage affecting right dominant side: Secondary | ICD-10-CM | POA: Diagnosis not present

## 2018-11-20 DIAGNOSIS — I509 Heart failure, unspecified: Secondary | ICD-10-CM | POA: Diagnosis not present

## 2018-11-20 DIAGNOSIS — Z48811 Encounter for surgical aftercare following surgery on the nervous system: Secondary | ICD-10-CM | POA: Diagnosis not present

## 2018-11-20 DIAGNOSIS — I69120 Aphasia following nontraumatic intracerebral hemorrhage: Secondary | ICD-10-CM | POA: Diagnosis not present

## 2018-11-20 DIAGNOSIS — E1122 Type 2 diabetes mellitus with diabetic chronic kidney disease: Secondary | ICD-10-CM | POA: Diagnosis not present

## 2018-11-20 DIAGNOSIS — I130 Hypertensive heart and chronic kidney disease with heart failure and stage 1 through stage 4 chronic kidney disease, or unspecified chronic kidney disease: Secondary | ICD-10-CM | POA: Diagnosis not present

## 2018-11-21 DIAGNOSIS — I69120 Aphasia following nontraumatic intracerebral hemorrhage: Secondary | ICD-10-CM | POA: Diagnosis not present

## 2018-11-21 DIAGNOSIS — J9611 Chronic respiratory failure with hypoxia: Secondary | ICD-10-CM | POA: Diagnosis not present

## 2018-11-21 DIAGNOSIS — E1151 Type 2 diabetes mellitus with diabetic peripheral angiopathy without gangrene: Secondary | ICD-10-CM | POA: Diagnosis not present

## 2018-11-21 DIAGNOSIS — I130 Hypertensive heart and chronic kidney disease with heart failure and stage 1 through stage 4 chronic kidney disease, or unspecified chronic kidney disease: Secondary | ICD-10-CM | POA: Diagnosis not present

## 2018-11-21 DIAGNOSIS — I509 Heart failure, unspecified: Secondary | ICD-10-CM | POA: Diagnosis not present

## 2018-11-21 DIAGNOSIS — I69151 Hemiplegia and hemiparesis following nontraumatic intracerebral hemorrhage affecting right dominant side: Secondary | ICD-10-CM | POA: Diagnosis not present

## 2018-11-21 DIAGNOSIS — N183 Chronic kidney disease, stage 3 (moderate): Secondary | ICD-10-CM | POA: Diagnosis not present

## 2018-11-21 DIAGNOSIS — E1122 Type 2 diabetes mellitus with diabetic chronic kidney disease: Secondary | ICD-10-CM | POA: Diagnosis not present

## 2018-11-21 DIAGNOSIS — D631 Anemia in chronic kidney disease: Secondary | ICD-10-CM | POA: Diagnosis not present

## 2018-11-21 DIAGNOSIS — Z48811 Encounter for surgical aftercare following surgery on the nervous system: Secondary | ICD-10-CM | POA: Diagnosis not present

## 2018-11-22 DIAGNOSIS — E1122 Type 2 diabetes mellitus with diabetic chronic kidney disease: Secondary | ICD-10-CM | POA: Diagnosis not present

## 2018-11-22 DIAGNOSIS — D631 Anemia in chronic kidney disease: Secondary | ICD-10-CM | POA: Diagnosis not present

## 2018-11-22 DIAGNOSIS — E1151 Type 2 diabetes mellitus with diabetic peripheral angiopathy without gangrene: Secondary | ICD-10-CM | POA: Diagnosis not present

## 2018-11-22 DIAGNOSIS — Z48811 Encounter for surgical aftercare following surgery on the nervous system: Secondary | ICD-10-CM | POA: Diagnosis not present

## 2018-11-22 DIAGNOSIS — I509 Heart failure, unspecified: Secondary | ICD-10-CM | POA: Diagnosis not present

## 2018-11-22 DIAGNOSIS — I69151 Hemiplegia and hemiparesis following nontraumatic intracerebral hemorrhage affecting right dominant side: Secondary | ICD-10-CM | POA: Diagnosis not present

## 2018-11-22 DIAGNOSIS — N183 Chronic kidney disease, stage 3 (moderate): Secondary | ICD-10-CM | POA: Diagnosis not present

## 2018-11-22 DIAGNOSIS — I130 Hypertensive heart and chronic kidney disease with heart failure and stage 1 through stage 4 chronic kidney disease, or unspecified chronic kidney disease: Secondary | ICD-10-CM | POA: Diagnosis not present

## 2018-11-22 DIAGNOSIS — J9611 Chronic respiratory failure with hypoxia: Secondary | ICD-10-CM | POA: Diagnosis not present

## 2018-11-22 DIAGNOSIS — I69120 Aphasia following nontraumatic intracerebral hemorrhage: Secondary | ICD-10-CM | POA: Diagnosis not present

## 2018-11-26 DIAGNOSIS — Z48811 Encounter for surgical aftercare following surgery on the nervous system: Secondary | ICD-10-CM | POA: Diagnosis not present

## 2018-11-26 DIAGNOSIS — J9611 Chronic respiratory failure with hypoxia: Secondary | ICD-10-CM | POA: Diagnosis not present

## 2018-11-26 DIAGNOSIS — I130 Hypertensive heart and chronic kidney disease with heart failure and stage 1 through stage 4 chronic kidney disease, or unspecified chronic kidney disease: Secondary | ICD-10-CM | POA: Diagnosis not present

## 2018-11-26 DIAGNOSIS — E1122 Type 2 diabetes mellitus with diabetic chronic kidney disease: Secondary | ICD-10-CM | POA: Diagnosis not present

## 2018-11-26 DIAGNOSIS — E1151 Type 2 diabetes mellitus with diabetic peripheral angiopathy without gangrene: Secondary | ICD-10-CM | POA: Diagnosis not present

## 2018-11-26 DIAGNOSIS — I509 Heart failure, unspecified: Secondary | ICD-10-CM | POA: Diagnosis not present

## 2018-11-26 DIAGNOSIS — D631 Anemia in chronic kidney disease: Secondary | ICD-10-CM | POA: Diagnosis not present

## 2018-11-26 DIAGNOSIS — I69120 Aphasia following nontraumatic intracerebral hemorrhage: Secondary | ICD-10-CM | POA: Diagnosis not present

## 2018-11-26 DIAGNOSIS — N183 Chronic kidney disease, stage 3 (moderate): Secondary | ICD-10-CM | POA: Diagnosis not present

## 2018-11-26 DIAGNOSIS — I69151 Hemiplegia and hemiparesis following nontraumatic intracerebral hemorrhage affecting right dominant side: Secondary | ICD-10-CM | POA: Diagnosis not present

## 2018-11-27 DIAGNOSIS — I69120 Aphasia following nontraumatic intracerebral hemorrhage: Secondary | ICD-10-CM | POA: Diagnosis not present

## 2018-11-27 DIAGNOSIS — E1122 Type 2 diabetes mellitus with diabetic chronic kidney disease: Secondary | ICD-10-CM | POA: Diagnosis not present

## 2018-11-27 DIAGNOSIS — I69151 Hemiplegia and hemiparesis following nontraumatic intracerebral hemorrhage affecting right dominant side: Secondary | ICD-10-CM | POA: Diagnosis not present

## 2018-11-27 DIAGNOSIS — Z48811 Encounter for surgical aftercare following surgery on the nervous system: Secondary | ICD-10-CM | POA: Diagnosis not present

## 2018-11-27 DIAGNOSIS — N183 Chronic kidney disease, stage 3 (moderate): Secondary | ICD-10-CM | POA: Diagnosis not present

## 2018-11-27 DIAGNOSIS — E1151 Type 2 diabetes mellitus with diabetic peripheral angiopathy without gangrene: Secondary | ICD-10-CM | POA: Diagnosis not present

## 2018-11-27 DIAGNOSIS — I509 Heart failure, unspecified: Secondary | ICD-10-CM | POA: Diagnosis not present

## 2018-11-27 DIAGNOSIS — D631 Anemia in chronic kidney disease: Secondary | ICD-10-CM | POA: Diagnosis not present

## 2018-11-27 DIAGNOSIS — J9611 Chronic respiratory failure with hypoxia: Secondary | ICD-10-CM | POA: Diagnosis not present

## 2018-11-27 DIAGNOSIS — I130 Hypertensive heart and chronic kidney disease with heart failure and stage 1 through stage 4 chronic kidney disease, or unspecified chronic kidney disease: Secondary | ICD-10-CM | POA: Diagnosis not present

## 2018-11-28 DIAGNOSIS — D631 Anemia in chronic kidney disease: Secondary | ICD-10-CM | POA: Diagnosis not present

## 2018-11-28 DIAGNOSIS — I130 Hypertensive heart and chronic kidney disease with heart failure and stage 1 through stage 4 chronic kidney disease, or unspecified chronic kidney disease: Secondary | ICD-10-CM | POA: Diagnosis not present

## 2018-11-28 DIAGNOSIS — Z48811 Encounter for surgical aftercare following surgery on the nervous system: Secondary | ICD-10-CM | POA: Diagnosis not present

## 2018-11-28 DIAGNOSIS — I69151 Hemiplegia and hemiparesis following nontraumatic intracerebral hemorrhage affecting right dominant side: Secondary | ICD-10-CM | POA: Diagnosis not present

## 2018-11-28 DIAGNOSIS — I509 Heart failure, unspecified: Secondary | ICD-10-CM | POA: Diagnosis not present

## 2018-11-28 DIAGNOSIS — J9611 Chronic respiratory failure with hypoxia: Secondary | ICD-10-CM | POA: Diagnosis not present

## 2018-11-28 DIAGNOSIS — E1122 Type 2 diabetes mellitus with diabetic chronic kidney disease: Secondary | ICD-10-CM | POA: Diagnosis not present

## 2018-11-28 DIAGNOSIS — N183 Chronic kidney disease, stage 3 unspecified: Secondary | ICD-10-CM | POA: Diagnosis not present

## 2018-11-28 DIAGNOSIS — E1151 Type 2 diabetes mellitus with diabetic peripheral angiopathy without gangrene: Secondary | ICD-10-CM | POA: Diagnosis not present

## 2018-11-28 DIAGNOSIS — I69120 Aphasia following nontraumatic intracerebral hemorrhage: Secondary | ICD-10-CM | POA: Diagnosis not present

## 2018-11-29 DIAGNOSIS — I130 Hypertensive heart and chronic kidney disease with heart failure and stage 1 through stage 4 chronic kidney disease, or unspecified chronic kidney disease: Secondary | ICD-10-CM | POA: Diagnosis not present

## 2018-11-29 DIAGNOSIS — Z48811 Encounter for surgical aftercare following surgery on the nervous system: Secondary | ICD-10-CM | POA: Diagnosis not present

## 2018-11-29 DIAGNOSIS — J9611 Chronic respiratory failure with hypoxia: Secondary | ICD-10-CM | POA: Diagnosis not present

## 2018-11-29 DIAGNOSIS — E1122 Type 2 diabetes mellitus with diabetic chronic kidney disease: Secondary | ICD-10-CM | POA: Diagnosis not present

## 2018-11-29 DIAGNOSIS — N183 Chronic kidney disease, stage 3 unspecified: Secondary | ICD-10-CM | POA: Diagnosis not present

## 2018-11-29 DIAGNOSIS — D631 Anemia in chronic kidney disease: Secondary | ICD-10-CM | POA: Diagnosis not present

## 2018-11-29 DIAGNOSIS — I509 Heart failure, unspecified: Secondary | ICD-10-CM | POA: Diagnosis not present

## 2018-11-29 DIAGNOSIS — E1151 Type 2 diabetes mellitus with diabetic peripheral angiopathy without gangrene: Secondary | ICD-10-CM | POA: Diagnosis not present

## 2018-11-29 DIAGNOSIS — I69120 Aphasia following nontraumatic intracerebral hemorrhage: Secondary | ICD-10-CM | POA: Diagnosis not present

## 2018-11-29 DIAGNOSIS — I69151 Hemiplegia and hemiparesis following nontraumatic intracerebral hemorrhage affecting right dominant side: Secondary | ICD-10-CM | POA: Diagnosis not present

## 2018-12-02 DIAGNOSIS — D631 Anemia in chronic kidney disease: Secondary | ICD-10-CM | POA: Diagnosis not present

## 2018-12-02 DIAGNOSIS — J9611 Chronic respiratory failure with hypoxia: Secondary | ICD-10-CM | POA: Diagnosis not present

## 2018-12-02 DIAGNOSIS — I509 Heart failure, unspecified: Secondary | ICD-10-CM | POA: Diagnosis not present

## 2018-12-02 DIAGNOSIS — E1122 Type 2 diabetes mellitus with diabetic chronic kidney disease: Secondary | ICD-10-CM | POA: Diagnosis not present

## 2018-12-02 DIAGNOSIS — I130 Hypertensive heart and chronic kidney disease with heart failure and stage 1 through stage 4 chronic kidney disease, or unspecified chronic kidney disease: Secondary | ICD-10-CM | POA: Diagnosis not present

## 2018-12-02 DIAGNOSIS — E1151 Type 2 diabetes mellitus with diabetic peripheral angiopathy without gangrene: Secondary | ICD-10-CM | POA: Diagnosis not present

## 2018-12-02 DIAGNOSIS — I69151 Hemiplegia and hemiparesis following nontraumatic intracerebral hemorrhage affecting right dominant side: Secondary | ICD-10-CM | POA: Diagnosis not present

## 2018-12-02 DIAGNOSIS — N183 Chronic kidney disease, stage 3 unspecified: Secondary | ICD-10-CM | POA: Diagnosis not present

## 2018-12-02 DIAGNOSIS — I69120 Aphasia following nontraumatic intracerebral hemorrhage: Secondary | ICD-10-CM | POA: Diagnosis not present

## 2018-12-02 DIAGNOSIS — Z48811 Encounter for surgical aftercare following surgery on the nervous system: Secondary | ICD-10-CM | POA: Diagnosis not present

## 2018-12-03 DIAGNOSIS — N183 Chronic kidney disease, stage 3 unspecified: Secondary | ICD-10-CM | POA: Diagnosis not present

## 2018-12-03 DIAGNOSIS — I509 Heart failure, unspecified: Secondary | ICD-10-CM | POA: Diagnosis not present

## 2018-12-03 DIAGNOSIS — Z48811 Encounter for surgical aftercare following surgery on the nervous system: Secondary | ICD-10-CM | POA: Diagnosis not present

## 2018-12-03 DIAGNOSIS — I69120 Aphasia following nontraumatic intracerebral hemorrhage: Secondary | ICD-10-CM | POA: Diagnosis not present

## 2018-12-03 DIAGNOSIS — E1122 Type 2 diabetes mellitus with diabetic chronic kidney disease: Secondary | ICD-10-CM | POA: Diagnosis not present

## 2018-12-03 DIAGNOSIS — I130 Hypertensive heart and chronic kidney disease with heart failure and stage 1 through stage 4 chronic kidney disease, or unspecified chronic kidney disease: Secondary | ICD-10-CM | POA: Diagnosis not present

## 2018-12-03 DIAGNOSIS — E1151 Type 2 diabetes mellitus with diabetic peripheral angiopathy without gangrene: Secondary | ICD-10-CM | POA: Diagnosis not present

## 2018-12-03 DIAGNOSIS — D631 Anemia in chronic kidney disease: Secondary | ICD-10-CM | POA: Diagnosis not present

## 2018-12-03 DIAGNOSIS — J9611 Chronic respiratory failure with hypoxia: Secondary | ICD-10-CM | POA: Diagnosis not present

## 2018-12-03 DIAGNOSIS — I69151 Hemiplegia and hemiparesis following nontraumatic intracerebral hemorrhage affecting right dominant side: Secondary | ICD-10-CM | POA: Diagnosis not present

## 2018-12-04 DIAGNOSIS — I69151 Hemiplegia and hemiparesis following nontraumatic intracerebral hemorrhage affecting right dominant side: Secondary | ICD-10-CM | POA: Diagnosis not present

## 2018-12-04 DIAGNOSIS — E1122 Type 2 diabetes mellitus with diabetic chronic kidney disease: Secondary | ICD-10-CM | POA: Diagnosis not present

## 2018-12-04 DIAGNOSIS — J9611 Chronic respiratory failure with hypoxia: Secondary | ICD-10-CM | POA: Diagnosis not present

## 2018-12-04 DIAGNOSIS — Z48811 Encounter for surgical aftercare following surgery on the nervous system: Secondary | ICD-10-CM | POA: Diagnosis not present

## 2018-12-04 DIAGNOSIS — D631 Anemia in chronic kidney disease: Secondary | ICD-10-CM | POA: Diagnosis not present

## 2018-12-04 DIAGNOSIS — I509 Heart failure, unspecified: Secondary | ICD-10-CM | POA: Diagnosis not present

## 2018-12-04 DIAGNOSIS — I130 Hypertensive heart and chronic kidney disease with heart failure and stage 1 through stage 4 chronic kidney disease, or unspecified chronic kidney disease: Secondary | ICD-10-CM | POA: Diagnosis not present

## 2018-12-04 DIAGNOSIS — N183 Chronic kidney disease, stage 3 unspecified: Secondary | ICD-10-CM | POA: Diagnosis not present

## 2018-12-04 DIAGNOSIS — E1151 Type 2 diabetes mellitus with diabetic peripheral angiopathy without gangrene: Secondary | ICD-10-CM | POA: Diagnosis not present

## 2018-12-04 DIAGNOSIS — I69120 Aphasia following nontraumatic intracerebral hemorrhage: Secondary | ICD-10-CM | POA: Diagnosis not present

## 2018-12-05 DIAGNOSIS — I130 Hypertensive heart and chronic kidney disease with heart failure and stage 1 through stage 4 chronic kidney disease, or unspecified chronic kidney disease: Secondary | ICD-10-CM | POA: Diagnosis not present

## 2018-12-05 DIAGNOSIS — E1122 Type 2 diabetes mellitus with diabetic chronic kidney disease: Secondary | ICD-10-CM | POA: Diagnosis not present

## 2018-12-05 DIAGNOSIS — D631 Anemia in chronic kidney disease: Secondary | ICD-10-CM | POA: Diagnosis not present

## 2018-12-05 DIAGNOSIS — N183 Chronic kidney disease, stage 3 unspecified: Secondary | ICD-10-CM | POA: Diagnosis not present

## 2018-12-05 DIAGNOSIS — Z48811 Encounter for surgical aftercare following surgery on the nervous system: Secondary | ICD-10-CM | POA: Diagnosis not present

## 2018-12-05 DIAGNOSIS — E1151 Type 2 diabetes mellitus with diabetic peripheral angiopathy without gangrene: Secondary | ICD-10-CM | POA: Diagnosis not present

## 2018-12-05 DIAGNOSIS — I69151 Hemiplegia and hemiparesis following nontraumatic intracerebral hemorrhage affecting right dominant side: Secondary | ICD-10-CM | POA: Diagnosis not present

## 2018-12-05 DIAGNOSIS — J9611 Chronic respiratory failure with hypoxia: Secondary | ICD-10-CM | POA: Diagnosis not present

## 2018-12-05 DIAGNOSIS — I69120 Aphasia following nontraumatic intracerebral hemorrhage: Secondary | ICD-10-CM | POA: Diagnosis not present

## 2018-12-05 DIAGNOSIS — I509 Heart failure, unspecified: Secondary | ICD-10-CM | POA: Diagnosis not present

## 2018-12-09 DIAGNOSIS — E1151 Type 2 diabetes mellitus with diabetic peripheral angiopathy without gangrene: Secondary | ICD-10-CM | POA: Diagnosis not present

## 2018-12-09 DIAGNOSIS — I130 Hypertensive heart and chronic kidney disease with heart failure and stage 1 through stage 4 chronic kidney disease, or unspecified chronic kidney disease: Secondary | ICD-10-CM | POA: Diagnosis not present

## 2018-12-09 DIAGNOSIS — I69120 Aphasia following nontraumatic intracerebral hemorrhage: Secondary | ICD-10-CM | POA: Diagnosis not present

## 2018-12-09 DIAGNOSIS — I509 Heart failure, unspecified: Secondary | ICD-10-CM | POA: Diagnosis not present

## 2018-12-09 DIAGNOSIS — E1122 Type 2 diabetes mellitus with diabetic chronic kidney disease: Secondary | ICD-10-CM | POA: Diagnosis not present

## 2018-12-09 DIAGNOSIS — I69151 Hemiplegia and hemiparesis following nontraumatic intracerebral hemorrhage affecting right dominant side: Secondary | ICD-10-CM | POA: Diagnosis not present

## 2018-12-09 DIAGNOSIS — N183 Chronic kidney disease, stage 3 unspecified: Secondary | ICD-10-CM | POA: Diagnosis not present

## 2018-12-09 DIAGNOSIS — Z48811 Encounter for surgical aftercare following surgery on the nervous system: Secondary | ICD-10-CM | POA: Diagnosis not present

## 2018-12-09 DIAGNOSIS — J9611 Chronic respiratory failure with hypoxia: Secondary | ICD-10-CM | POA: Diagnosis not present

## 2018-12-09 DIAGNOSIS — D631 Anemia in chronic kidney disease: Secondary | ICD-10-CM | POA: Diagnosis not present

## 2018-12-10 DIAGNOSIS — E1122 Type 2 diabetes mellitus with diabetic chronic kidney disease: Secondary | ICD-10-CM | POA: Diagnosis not present

## 2018-12-10 DIAGNOSIS — I69151 Hemiplegia and hemiparesis following nontraumatic intracerebral hemorrhage affecting right dominant side: Secondary | ICD-10-CM | POA: Diagnosis not present

## 2018-12-10 DIAGNOSIS — J9611 Chronic respiratory failure with hypoxia: Secondary | ICD-10-CM | POA: Diagnosis not present

## 2018-12-10 DIAGNOSIS — N183 Chronic kidney disease, stage 3 unspecified: Secondary | ICD-10-CM | POA: Diagnosis not present

## 2018-12-10 DIAGNOSIS — I130 Hypertensive heart and chronic kidney disease with heart failure and stage 1 through stage 4 chronic kidney disease, or unspecified chronic kidney disease: Secondary | ICD-10-CM | POA: Diagnosis not present

## 2018-12-10 DIAGNOSIS — Z48811 Encounter for surgical aftercare following surgery on the nervous system: Secondary | ICD-10-CM | POA: Diagnosis not present

## 2018-12-10 DIAGNOSIS — E1151 Type 2 diabetes mellitus with diabetic peripheral angiopathy without gangrene: Secondary | ICD-10-CM | POA: Diagnosis not present

## 2018-12-10 DIAGNOSIS — D631 Anemia in chronic kidney disease: Secondary | ICD-10-CM | POA: Diagnosis not present

## 2018-12-10 DIAGNOSIS — I509 Heart failure, unspecified: Secondary | ICD-10-CM | POA: Diagnosis not present

## 2018-12-10 DIAGNOSIS — I69120 Aphasia following nontraumatic intracerebral hemorrhage: Secondary | ICD-10-CM | POA: Diagnosis not present

## 2018-12-11 DIAGNOSIS — Z48811 Encounter for surgical aftercare following surgery on the nervous system: Secondary | ICD-10-CM | POA: Diagnosis not present

## 2018-12-11 DIAGNOSIS — E1122 Type 2 diabetes mellitus with diabetic chronic kidney disease: Secondary | ICD-10-CM | POA: Diagnosis not present

## 2018-12-11 DIAGNOSIS — D631 Anemia in chronic kidney disease: Secondary | ICD-10-CM | POA: Diagnosis not present

## 2018-12-11 DIAGNOSIS — I130 Hypertensive heart and chronic kidney disease with heart failure and stage 1 through stage 4 chronic kidney disease, or unspecified chronic kidney disease: Secondary | ICD-10-CM | POA: Diagnosis not present

## 2018-12-11 DIAGNOSIS — N183 Chronic kidney disease, stage 3 unspecified: Secondary | ICD-10-CM | POA: Diagnosis not present

## 2018-12-11 DIAGNOSIS — I69120 Aphasia following nontraumatic intracerebral hemorrhage: Secondary | ICD-10-CM | POA: Diagnosis not present

## 2018-12-11 DIAGNOSIS — J9611 Chronic respiratory failure with hypoxia: Secondary | ICD-10-CM | POA: Diagnosis not present

## 2018-12-11 DIAGNOSIS — I69151 Hemiplegia and hemiparesis following nontraumatic intracerebral hemorrhage affecting right dominant side: Secondary | ICD-10-CM | POA: Diagnosis not present

## 2018-12-11 DIAGNOSIS — I509 Heart failure, unspecified: Secondary | ICD-10-CM | POA: Diagnosis not present

## 2018-12-11 DIAGNOSIS — E1151 Type 2 diabetes mellitus with diabetic peripheral angiopathy without gangrene: Secondary | ICD-10-CM | POA: Diagnosis not present

## 2018-12-13 DIAGNOSIS — I509 Heart failure, unspecified: Secondary | ICD-10-CM | POA: Diagnosis not present

## 2018-12-13 DIAGNOSIS — E1151 Type 2 diabetes mellitus with diabetic peripheral angiopathy without gangrene: Secondary | ICD-10-CM | POA: Diagnosis not present

## 2018-12-13 DIAGNOSIS — Z48811 Encounter for surgical aftercare following surgery on the nervous system: Secondary | ICD-10-CM | POA: Diagnosis not present

## 2018-12-13 DIAGNOSIS — D631 Anemia in chronic kidney disease: Secondary | ICD-10-CM | POA: Diagnosis not present

## 2018-12-13 DIAGNOSIS — N183 Chronic kidney disease, stage 3 unspecified: Secondary | ICD-10-CM | POA: Diagnosis not present

## 2018-12-13 DIAGNOSIS — I69120 Aphasia following nontraumatic intracerebral hemorrhage: Secondary | ICD-10-CM | POA: Diagnosis not present

## 2018-12-13 DIAGNOSIS — J9611 Chronic respiratory failure with hypoxia: Secondary | ICD-10-CM | POA: Diagnosis not present

## 2018-12-13 DIAGNOSIS — I130 Hypertensive heart and chronic kidney disease with heart failure and stage 1 through stage 4 chronic kidney disease, or unspecified chronic kidney disease: Secondary | ICD-10-CM | POA: Diagnosis not present

## 2018-12-13 DIAGNOSIS — E1122 Type 2 diabetes mellitus with diabetic chronic kidney disease: Secondary | ICD-10-CM | POA: Diagnosis not present

## 2018-12-13 DIAGNOSIS — I69151 Hemiplegia and hemiparesis following nontraumatic intracerebral hemorrhage affecting right dominant side: Secondary | ICD-10-CM | POA: Diagnosis not present

## 2018-12-19 DIAGNOSIS — I130 Hypertensive heart and chronic kidney disease with heart failure and stage 1 through stage 4 chronic kidney disease, or unspecified chronic kidney disease: Secondary | ICD-10-CM | POA: Diagnosis not present

## 2018-12-19 DIAGNOSIS — I69151 Hemiplegia and hemiparesis following nontraumatic intracerebral hemorrhage affecting right dominant side: Secondary | ICD-10-CM | POA: Diagnosis not present

## 2018-12-19 DIAGNOSIS — J9611 Chronic respiratory failure with hypoxia: Secondary | ICD-10-CM | POA: Diagnosis not present

## 2018-12-19 DIAGNOSIS — I69120 Aphasia following nontraumatic intracerebral hemorrhage: Secondary | ICD-10-CM | POA: Diagnosis not present

## 2018-12-19 DIAGNOSIS — E1151 Type 2 diabetes mellitus with diabetic peripheral angiopathy without gangrene: Secondary | ICD-10-CM | POA: Diagnosis not present

## 2018-12-19 DIAGNOSIS — D631 Anemia in chronic kidney disease: Secondary | ICD-10-CM | POA: Diagnosis not present

## 2018-12-19 DIAGNOSIS — N183 Chronic kidney disease, stage 3 unspecified: Secondary | ICD-10-CM | POA: Diagnosis not present

## 2018-12-19 DIAGNOSIS — I509 Heart failure, unspecified: Secondary | ICD-10-CM | POA: Diagnosis not present

## 2018-12-19 DIAGNOSIS — Z48811 Encounter for surgical aftercare following surgery on the nervous system: Secondary | ICD-10-CM | POA: Diagnosis not present

## 2018-12-19 DIAGNOSIS — E1122 Type 2 diabetes mellitus with diabetic chronic kidney disease: Secondary | ICD-10-CM | POA: Diagnosis not present

## 2018-12-19 LAB — BLOOD GAS, ARTERIAL
Acid-Base Excess: 6.2 mmol/L — ABNORMAL HIGH (ref 0.0–2.0)
Bicarbonate: 28.7 mmol/L — ABNORMAL HIGH (ref 20.0–28.0)
Drawn by: 414221
FIO2: 28
O2 Saturation: 99.4 %
PEEP: 5 cmH2O
Patient temperature: 98.6
RATE: 12 resp/min
pCO2 arterial: 30.7 mmHg — ABNORMAL LOW (ref 32.0–48.0)
pH, Arterial: 7.578 — ABNORMAL HIGH (ref 7.350–7.450)
pO2, Arterial: 123 mmHg — ABNORMAL HIGH (ref 83.0–108.0)

## 2018-12-23 DIAGNOSIS — I10 Essential (primary) hypertension: Secondary | ICD-10-CM | POA: Diagnosis not present

## 2018-12-23 DIAGNOSIS — I69320 Aphasia following cerebral infarction: Secondary | ICD-10-CM | POA: Diagnosis not present

## 2018-12-23 DIAGNOSIS — I610 Nontraumatic intracerebral hemorrhage in hemisphere, subcortical: Secondary | ICD-10-CM | POA: Diagnosis not present

## 2018-12-25 DIAGNOSIS — N183 Chronic kidney disease, stage 3 unspecified: Secondary | ICD-10-CM | POA: Diagnosis not present

## 2018-12-25 DIAGNOSIS — I509 Heart failure, unspecified: Secondary | ICD-10-CM | POA: Diagnosis not present

## 2018-12-25 DIAGNOSIS — E1151 Type 2 diabetes mellitus with diabetic peripheral angiopathy without gangrene: Secondary | ICD-10-CM | POA: Diagnosis not present

## 2018-12-25 DIAGNOSIS — E1122 Type 2 diabetes mellitus with diabetic chronic kidney disease: Secondary | ICD-10-CM | POA: Diagnosis not present

## 2018-12-25 DIAGNOSIS — Z48811 Encounter for surgical aftercare following surgery on the nervous system: Secondary | ICD-10-CM | POA: Diagnosis not present

## 2018-12-25 DIAGNOSIS — I69151 Hemiplegia and hemiparesis following nontraumatic intracerebral hemorrhage affecting right dominant side: Secondary | ICD-10-CM | POA: Diagnosis not present

## 2018-12-25 DIAGNOSIS — J9611 Chronic respiratory failure with hypoxia: Secondary | ICD-10-CM | POA: Diagnosis not present

## 2018-12-25 DIAGNOSIS — I69120 Aphasia following nontraumatic intracerebral hemorrhage: Secondary | ICD-10-CM | POA: Diagnosis not present

## 2018-12-25 DIAGNOSIS — I130 Hypertensive heart and chronic kidney disease with heart failure and stage 1 through stage 4 chronic kidney disease, or unspecified chronic kidney disease: Secondary | ICD-10-CM | POA: Diagnosis not present

## 2018-12-25 DIAGNOSIS — D631 Anemia in chronic kidney disease: Secondary | ICD-10-CM | POA: Diagnosis not present

## 2018-12-27 DIAGNOSIS — I69120 Aphasia following nontraumatic intracerebral hemorrhage: Secondary | ICD-10-CM | POA: Diagnosis not present

## 2018-12-27 DIAGNOSIS — I509 Heart failure, unspecified: Secondary | ICD-10-CM | POA: Diagnosis not present

## 2018-12-27 DIAGNOSIS — D631 Anemia in chronic kidney disease: Secondary | ICD-10-CM | POA: Diagnosis not present

## 2018-12-27 DIAGNOSIS — E1122 Type 2 diabetes mellitus with diabetic chronic kidney disease: Secondary | ICD-10-CM | POA: Diagnosis not present

## 2018-12-27 DIAGNOSIS — Z48811 Encounter for surgical aftercare following surgery on the nervous system: Secondary | ICD-10-CM | POA: Diagnosis not present

## 2018-12-27 DIAGNOSIS — E1151 Type 2 diabetes mellitus with diabetic peripheral angiopathy without gangrene: Secondary | ICD-10-CM | POA: Diagnosis not present

## 2018-12-27 DIAGNOSIS — I69151 Hemiplegia and hemiparesis following nontraumatic intracerebral hemorrhage affecting right dominant side: Secondary | ICD-10-CM | POA: Diagnosis not present

## 2018-12-27 DIAGNOSIS — N183 Chronic kidney disease, stage 3 unspecified: Secondary | ICD-10-CM | POA: Diagnosis not present

## 2018-12-27 DIAGNOSIS — J9611 Chronic respiratory failure with hypoxia: Secondary | ICD-10-CM | POA: Diagnosis not present

## 2018-12-27 DIAGNOSIS — I130 Hypertensive heart and chronic kidney disease with heart failure and stage 1 through stage 4 chronic kidney disease, or unspecified chronic kidney disease: Secondary | ICD-10-CM | POA: Diagnosis not present

## 2018-12-30 ENCOUNTER — Ambulatory Visit: Payer: Medicare HMO | Admitting: Physical Medicine & Rehabilitation

## 2019-01-02 DIAGNOSIS — D631 Anemia in chronic kidney disease: Secondary | ICD-10-CM | POA: Diagnosis not present

## 2019-01-02 DIAGNOSIS — J9611 Chronic respiratory failure with hypoxia: Secondary | ICD-10-CM | POA: Diagnosis not present

## 2019-01-02 DIAGNOSIS — I130 Hypertensive heart and chronic kidney disease with heart failure and stage 1 through stage 4 chronic kidney disease, or unspecified chronic kidney disease: Secondary | ICD-10-CM | POA: Diagnosis not present

## 2019-01-02 DIAGNOSIS — E1151 Type 2 diabetes mellitus with diabetic peripheral angiopathy without gangrene: Secondary | ICD-10-CM | POA: Diagnosis not present

## 2019-01-02 DIAGNOSIS — I509 Heart failure, unspecified: Secondary | ICD-10-CM | POA: Diagnosis not present

## 2019-01-02 DIAGNOSIS — I69120 Aphasia following nontraumatic intracerebral hemorrhage: Secondary | ICD-10-CM | POA: Diagnosis not present

## 2019-01-02 DIAGNOSIS — N183 Chronic kidney disease, stage 3 unspecified: Secondary | ICD-10-CM | POA: Diagnosis not present

## 2019-01-02 DIAGNOSIS — Z48811 Encounter for surgical aftercare following surgery on the nervous system: Secondary | ICD-10-CM | POA: Diagnosis not present

## 2019-01-02 DIAGNOSIS — E1122 Type 2 diabetes mellitus with diabetic chronic kidney disease: Secondary | ICD-10-CM | POA: Diagnosis not present

## 2019-01-02 DIAGNOSIS — I69151 Hemiplegia and hemiparesis following nontraumatic intracerebral hemorrhage affecting right dominant side: Secondary | ICD-10-CM | POA: Diagnosis not present

## 2019-01-03 ENCOUNTER — Encounter: Payer: Medicare HMO | Admitting: Physical Medicine & Rehabilitation

## 2019-01-09 ENCOUNTER — Encounter: Payer: Self-pay | Admitting: Occupational Therapy

## 2019-01-09 ENCOUNTER — Ambulatory Visit: Payer: Medicare HMO | Admitting: Occupational Therapy

## 2019-01-09 ENCOUNTER — Ambulatory Visit: Payer: Medicare HMO | Attending: Student

## 2019-01-09 ENCOUNTER — Encounter: Payer: Self-pay | Admitting: Physical Therapy

## 2019-01-09 ENCOUNTER — Other Ambulatory Visit: Payer: Self-pay

## 2019-01-09 ENCOUNTER — Ambulatory Visit: Payer: Medicare HMO | Admitting: Physical Therapy

## 2019-01-09 DIAGNOSIS — R2689 Other abnormalities of gait and mobility: Secondary | ICD-10-CM

## 2019-01-09 DIAGNOSIS — R278 Other lack of coordination: Secondary | ICD-10-CM | POA: Diagnosis not present

## 2019-01-09 DIAGNOSIS — I69115 Cognitive social or emotional deficit following nontraumatic intracerebral hemorrhage: Secondary | ICD-10-CM

## 2019-01-09 DIAGNOSIS — R41842 Visuospatial deficit: Secondary | ICD-10-CM | POA: Insufficient documentation

## 2019-01-09 DIAGNOSIS — I69151 Hemiplegia and hemiparesis following nontraumatic intracerebral hemorrhage affecting right dominant side: Secondary | ICD-10-CM

## 2019-01-09 DIAGNOSIS — R4701 Aphasia: Secondary | ICD-10-CM | POA: Insufficient documentation

## 2019-01-09 DIAGNOSIS — M6281 Muscle weakness (generalized): Secondary | ICD-10-CM | POA: Insufficient documentation

## 2019-01-09 DIAGNOSIS — M25611 Stiffness of right shoulder, not elsewhere classified: Secondary | ICD-10-CM

## 2019-01-09 DIAGNOSIS — I69351 Hemiplegia and hemiparesis following cerebral infarction affecting right dominant side: Secondary | ICD-10-CM | POA: Insufficient documentation

## 2019-01-09 DIAGNOSIS — R41841 Cognitive communication deficit: Secondary | ICD-10-CM

## 2019-01-09 NOTE — Therapy (Signed)
[facility] [address] [place], [place], [address] Phone: [phone]   Fax:  [phone]  Physical Therapy Evaluation  Patient Details  Name: Solo Wilcock MRN: [id number] Date of Birth: [birth year] No data recorded  Encounter Date: 01/09/2019  PT End of Session - 01/09/19 1607    Visit Number  1    Number of Visits  8    Date for PT Re-Evaluation  02/20/19    Authorization Type  [employer] MCR    PT Start Time  1400    PT Stop Time  1445    PT Time Calculation (min)  45 min    Behavior During Therapy  [facility] for tasks assessed/performed   aphasia      Past Medical History:  Diagnosis Date  . Acute on chronic respiratory failure with hypoxia (HCC)   . Altered mental status, unspecified   . Chronic kidney disease    CKD stage 3  . Chronic kidney disease, stage III (moderate)   . Concussion    multiple in high school  . Coronary artery disease    pt denies  . Diabetes mellitus without complication (HCC)    borderline  . Hypertension   . Nontraumatic intracranial hemorrhage, unspecified (HCC)   . PVD (peripheral vascular disease) (HCC)    leg stents femoral artery  . Stroke [facility])    3 years ago- no residual    Past Surgical History:  Procedure Laterality Date  . ABDOMINAL AORTIC ENDOVASCULAR STENT GRAFT N/A 02/01/2018   Procedure: ABDOMINAL AORTIC ENDOVASCULAR STENT GRAFT;  Surgeon: Nada Libman, MD;  Location: [facility] OR;  Service: Vascular;  Laterality: N/A;  . NECK SURGERY    . VEIN BYPASS SURGERY      There were no vitals filed for this visit.   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  In  addition he has right neglect and possible right field cut. His daughter relays he is having trouble with overall strength, endurance, balance.    Pertinent History  history of HTN, AAA, ASCVD, CVA 3 years ago and on 07/05/2018,difficulty weaning from vent    How long can you stand comfortably?  unsure    How long can you walk comfortably?  unsure    Patient Stated Goals  daugher wants to improve strength, balance, endurance, cognition    Currently in Pain?  No/denies         [facility] PT Assessment - 01/09/19 1420      Assessment   Medical Diagnosis  ICH 07/05/18    Hand Dominance  Right    Prior Therapy  CIR June, [facility]- July-Nov      Prior Function   Level of Independence  Independent with basic ADLs;Independent with household mobility without device;Independent with community mobility without device;Independent with homemaking with ambulation    Vocation Requirements  Was a Heritage manager at [employer], [employer], family, travel      Pharmacist, hospital Test  [facility]    Heel Shin Test  [facility]      ROM / Strength   AROM / PROM / Strength  Strength  AROM   Overall AROM   Deficits    Overall AROM Comments  reduced ROM shoulder, WFL LE except Rt ankle DF limited to about neutral due to tightness and weakness    Right Shoulder Flexion  120 Degrees    Right Shoulder ABduction  115 Degrees      Strength   Overall Strength  Deficits    Strength Assessment Site  Hip;Knee;Ankle    Right/Left Hip  Right;Left    Right Hip Flexion  4/5    Right Hip Extension  4/5    Right Hip External Rotation   4+/5    Right Hip Internal Rotation  4+/5    Right Hip ABduction  4/5    Left Hip Flexion  4+/5    Left Hip Extension  4/5    Left Hip External Rotation  5/5    Left Hip Internal Rotation  5/5    Left Hip ABduction  4/5    Right/Left Knee  Right;Left    Right Knee Flexion  4/5    Right Knee Extension  5/5    Left Knee Flexion  5/5    Left Knee Extension   5/5    Right/Left Ankle  Right;Left    Right Ankle Dorsiflexion  4/5    Right Ankle Plantar Flexion  4+/5    Left Ankle Dorsiflexion  5/5    Left Ankle Plantar Flexion  5/5      Flexibility   Soft Tissue Assessment /Muscle Length  --   tight heelcord on Rt     Transfers   Transfers  Independent with all Transfers      Ambulation/Gait   Ambulation/Gait  Yes    Ambulation/Gait Assistance  5: Supervision    Ambulation Distance (Feet)  230 Feet    Gait velocity  WFL    Gait Comments  mostly WFL gait pattern and velocity but does have some mild instability and Rt neglect so sometimes gets too close to objects on his Rt side      Standardized Balance Assessment   Standardized Balance Assessment  Berg Balance Test;Timed Up and Go Test;Five Times Sit to Stand    Five times sit to stand comments   9.9 SEC      Berg Balance Test   Sit to Stand  Able to stand without using hands and stabilize independently    Standing Unsupported  Able to stand safely 2 minutes    Sitting with Back Unsupported but Feet Supported on Floor or Stool  Able to sit safely and securely 2 minutes    Stand to Sit  Sits safely with minimal use of hands    Transfers  Able to transfer safely, minor use of hands    Standing Unsupported with Eyes Closed  Able to stand 10 seconds with supervision    Standing Unsupported with Feet Together  Able to place feet together independently and stand 1 minute safely    From Standing, Reach Forward with Outstretched Arm  Can reach confidently >25 cm (10")    From Standing Position, Pick up Object from Floor  Able to pick up shoe safely and easily    From Standing Position, Turn to Look Behind Over each Shoulder  Looks behind from both sides and weight shifts well    Turn 360 Degrees  Able to turn 360 degrees safely in 4 seconds or less    Standing Unsupported, Alternately Place Feet on Step/Stool  Able to stand independently and safely and  complete 8 steps in 20 seconds    Standing  Unsupported, One Foot in Front  Able to take small step independently and hold 30 seconds    Standing on One Leg  Able to lift leg independently and hold 5-10 seconds    Total Score  52      Functional Gait  Assessment   Gait assessed   Yes    Gait Level Surface  Walks 20 ft in less than 5.5 sec, no assistive devices, good speed, no evidence for imbalance, normal gait pattern, deviates no more than 6 in outside of the 12 in walkway width.    Change in Gait Speed  Able to smoothly change walking speed without loss of balance or gait deviation. Deviate no more than 6 in outside of the 12 in walkway width.    Gait with Horizontal Head Turns  Performs head turns with moderate changes in gait velocity, slows down, deviates 10-15 in outside 12 in walkway width but recovers, can continue to walk.    Gait with Vertical Head Turns  Performs task with slight change in gait velocity (eg, minor disruption to smooth gait path), deviates 6 - 10 in outside 12 in walkway width or uses assistive device    Gait and Pivot Turn  Pivot turns safely within 3 sec and stops quickly with no loss of balance.    Step Over Obstacle  Is able to step over one shoe box (4.5 in total height) but must slow down and adjust steps to clear box safely. May require verbal cueing.    Gait with Narrow Base of Support  Ambulates 4-7 steps.    Gait with Eyes Closed  Walks 20 ft, uses assistive device, slower speed, mild gait deviations, deviates 6-10 in outside 12 in walkway width. Ambulates 20 ft in less than 9 sec but greater than 7 sec.    Ambulating Backwards  Walks 20 ft, uses assistive device, slower speed, mild gait deviations, deviates 6-10 in outside 12 in walkway width.    Steps  Alternating feet, no rail.    Total Score  21                Objective measurements completed on examination: See above findings.              PT Education - 01/09/19 1607    Education Details  HEP,exam findings, POC     Person(s) Educated  Patient;Child(ren)    Methods  Explanation;Demonstration;Verbal cues;Handout    Comprehension  Verbalized understanding;Need further instruction          PT Long Term Goals - 01/09/19 1615      PT LONG TERM GOAL #1   Title  Pt will be I and compliant with HEP. (target for all LTG 6 weeks 02/20/19)    Status  New      PT LONG TERM GOAL #2   Title  Pt will improve FGA score to at least 25/30 to show improved balance.    Baseline  21    Status  New      PT LONG TERM GOAL #3   Title  Pt will improve overall hip strength to 4+/5 bilat, knee strength to 5/5 bilat, and Rt ankle strength to 4+/5 to improve functional strength.    Status  New      PT LONG TERM GOAL #4   Title  Pt will improve Rt side neglect/inattention with gait and be about to avoid running into  objects and improve scanning his enviornment.    Status  New             Plan - 01/09/19 1608    Clinical Impression Statement  Patient is a 68 yr old man S/P L ICH with craniectomy and resection, with complicated hospital course involving difficult vent wean.  Patient home now and he has complete HH therapies.  Patient presents to PT eval with daughter, who is assisting as caregiver.  Patient demonstrates the following impariments; right sided inattention/neglect, right shoulder stiffness, soreness, aphasia, mild apraxia, right hemiparesis (UE more affected than LE), mild decreased coordiantion, mild gait instability, mild balance deficits,and decreased endurance.   Patient will benefit from skilled PT intervention to address these deficits.    Personal Factors and Comorbidities  Comorbidity 3+    Comorbidities  history of HTN, AAA, ASCVD, CVA 3 years ago and on 07/05/2018, with diagnosis of left temporal AVM was admitted to [facility] on 07/05/2018.    Examination-Activity Limitations  Carry;Squat;Stairs;Lift;Locomotion Level    Examination-Participation Restrictions  Community  Activity;Driving;Yard Work;Shop;Meal Prep;Cleaning    Stability/Clinical Decision Making  Evolving/Moderate complexity    Clinical Decision Making  Moderate    Rehab Potential  Good    PT Frequency  Other (comment)   1-2   PT Duration  6 weeks    PT Treatment/Interventions  ADLs/Self Care Home Management;Electrical Stimulation;Cryotherapy;Moist Heat;Gait training;Therapeutic activities;Therapeutic exercise;Balance training;Neuromuscular re-education;Manual techniques;Orthotic Fit/Training;Taping    PT Next Visit Plan  needs high level balance challenges, general Rt sided strength and endurance    PT Home Exercise Plan  Access Code: [email or web address]    Consulted and Agree with Plan of Care  Patient       Patient will benefit from skilled therapeutic intervention in order to improve the following deficits and impairments:  Abnormal gait, Decreased activity tolerance, Decreased balance, Decreased coordination, Decreased endurance, Decreased range of motion, Decreased strength, Difficulty walking, Impaired flexibility, Pain  Visit Diagnosis: Muscle weakness (generalized)  Other abnormalities of gait and mobility  Hemiplegia and hemiparesis following cerebral infarction affecting right dominant side [facility])     Problem List Patient Active Problem List   Diagnosis Date Noted  . 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 S/P percutaneous endoscopic gastrostomy (PEG) tube placement (HCC)   . 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 CKD (chronic kidney disease), stage III   . Acute blood loss anemia   . Diabetes mellitus type 2 in nonobese (HCC)   . Essential hypertension   . Wernicke's fluent aphasia   . Acute on chronic respiratory failure with hypoxia (HCC)   . Chronic kidney disease, stage III (moderate)   . Nontraumatic intracranial hemorrhage, unspecified (HCC)   . Altered mental status, unspecified   . S/P AAA repair 02/01/2018    Birdie Riddle 01/09/2019, 9:00 PM  [facility] [address] [place], [place], [address] Phone: [phone]   Fax:  [phone]  Name: Kyion Gautier MRN: [id number] Date of Birth: January 22, 1951

## 2019-01-09 NOTE — Therapy (Signed)
[facility] [address] [place], [place], [address] Phone: [phone]   Fax:  [phone]  Occupational Therapy Evaluation  Patient Details  Name: Mark [name] MRN: [id number] Date of Birth: [birth year] No data recorded  Encounter Date: 01/09/2019  OT End of Session - 01/09/19 1434    Visit Number  1    Number of Visits  17    Date for OT Re-Evaluation  03/20/19    Authorization Type  [employer] - need 10th visit PN    Authorization - Visit Number  1    Authorization - Number of Visits  10    OT Start Time  1314    OT Stop Time  1400    OT Time Calculation (min)  46 min    Activity Tolerance  Patient tolerated treatment well    Behavior During Therapy  [facility] for tasks assessed/performed       Past Medical History:  Diagnosis Date  . Acute on chronic respiratory failure with hypoxia (HCC)   . Altered mental status, unspecified   . Chronic kidney disease    CKD stage 3  . Chronic kidney disease, stage III (moderate)   . Concussion    multiple in high school  . Coronary artery disease    pt denies  . Diabetes mellitus without complication (HCC)    borderline  . Hypertension   . Nontraumatic intracranial hemorrhage, unspecified (HCC)   . PVD (peripheral vascular disease) (HCC)    leg stents femoral artery  . Stroke [facility])    3 years ago- no residual    Past Surgical History:  Procedure Laterality Date  . ABDOMINAL AORTIC ENDOVASCULAR STENT GRAFT N/A 02/01/2018   Procedure: ABDOMINAL AORTIC ENDOVASCULAR STENT GRAFT;  Surgeon: Nada Libman, MD;  Location: [facility] OR;  Service: Vascular;  Laterality: N/A;  . NECK SURGERY    . VEIN BYPASS SURGERY      There were no vitals filed for this visit.  Subjective Assessment - 01/09/19 1322    Subjective   The memory stuff regarding why did doctor send you to therapy?    Patient is accompanied by:  Family member    Currently in Pain?  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[employer], [employer], family, travel      ADL   Eating/Feeding  Independent    Grooming  Supervision/safety    Upper Body Bathing  Supervision/safety    Lower Body Bathing  Supervision/safety    Upper Body Dressing  Supervision/safety    Lower Body Dressing  Supervision/safety    Lexicographer - Recruitment consultant -  Geneticist, molecular  Modified independent  IADL   Prior Level of Function Shopping  Independent    Shopping  Completely unable to shop    Prior Level of Function Light Housekeeping  independent    Light Housekeeping  Performs light daily tasks but cannot maintain acceptable level of cleanliness    Prior Level of Function Meal Prep  Independent    Meal Prep  Able to complete simple cold meal and snack prep    Prior Level of Function Community Mobility  independent    Community Mobility  Relies on family or friends for transportation    Prior Level of Function Medication Managment  independent    Medication Management  Is not capable of dispensing or managing own medication    Prior Level of Function Financial Management  Independent    Financial Management  Requires assistance      Written Expression   Dominant Hand  Right    Handwriting  100% legible   Able to write his name legibly - aphasia     Vision - History    Baseline Vision  Wears glasses all the time    Additional Comments  Patient trying to explain right neglect - by showing there is part of his hand he cannot see      Vision Assessment   Eye Alignment  Within Functional Limits    Ocular Range of Motion  Within Functional Limits    Alignment/Gaze Preference  Within Defined Limits    Tracking/Visual Pursuits  Able to track stimulus in all quads without difficulty    Comment  Right neglect/inattention      Activity Tolerance   Activity Tolerance  Tolerates 10-20 min activity with multiple rests    Activity Tolerance Comments  Naps daily      Cognition   Overall Cognitive Status  Impaired/Different from baseline    Cognition Comments  Patient has significant aphasia, so difficult to ascertain in evaluation cognition versus language.  Patient attentive, making good eye contact, responding when something is unclear., etc      Posture/Postural Control   Posture/Postural Control  No significant limitations      Sensation   Light Touch  Impaired by gross assessment   Some indication of right sided deficit - need to assess   Stereognosis  Not tested    Hot/Cold  Not tested    Proprioception  Not tested      Coordination   Gross Motor Movements are Fluid and Coordinated  No    Fine Motor Movements are Fluid and Coordinated  No    Coordination and Movement Description  Right sided rigidity influences fluid movement    9 Hole Peg Test  Right;Left    Right 9 Hole Peg Test  38.16    Left 9 Hole Peg Test  33.97      Perception   Perception  Impaired    Inattention/Neglect  Does not attend to right visual field      Praxis   Praxis  Impaired    Praxis Impairment Details  Motor planning      Tone   Assessment Location  Right Upper Extremity      ROM / Strength   AROM / PROM / Strength  AROM;Strength      AROM   Overall AROM   Deficits    Overall AROM Comments  reduced ROM shoulder    AROM Assessment Site  Shoulder    Right/Left  Shoulder  Right    Right Shoulder Flexion  120 Degrees    Right Shoulder ABduction  115 Degrees      Strength   Overall Strength  Deficits      Hand Function   Right Hand Gross Grasp  Impaired    Right Hand Grip (lbs)  43    Right Hand Lateral Pinch  14 lbs    Left Hand Gross Grasp  Functional    Left Hand Grip (lbs)  65    Left Hand Lateral Pinch  18 lbs      RUE Tone   RUE Tone  Mild;Hypertonic      RUE Tone   Hypertonic Details  elbow flexors                      OT Education - 01/09/19 1434    Education Details  reviewed with daughter in patient's presence the need to tease out cognitive abilities from language skills, discussed perceptual deficits versus "eye problem"    Person(s) Educated  Patient;Child(ren)    Methods  Explanation    Comprehension  Verbalized understanding;Need further instruction       OT Short Term Goals - 01/09/19 1449      OT SHORT TERM GOAL #1   Title  Patient will complete a HEP designed to improve range of motion in RUE    Time  4    Period  Weeks    Status  New    Target Date  02/13/19      OT SHORT TERM GOAL #2   Title  Patient will demonstrate safe use of stove top to prepare a warm meal with supervision    Time  4    Period  Weeks    Status  New      OT SHORT TERM GOAL #3   Title  Patient will bathe and dress himself appropriately for the weather with no more than minimal prompting    Time  4    Period  Weeks    Status  New      OT SHORT TERM GOAL #4   Title  Patient will navigate through minimally crowded hallway avoiding obstacles with minimal prompting    Time  4    Period  Weeks    Status  New        OT Long Term Goals - 01/09/19 1453      OT LONG TERM GOAL #1   Title  Patient will complete an updated HEP to address right UE strength    Time  8    Period  Weeks    Status  New    Target Date  03/20/19      OT LONG TERM GOAL #2   Title  Patient will be able to be left alone for 8+ hours during  daytime, and manage his own needs.    Time  8    Period  Weeks    Status  New      OT LONG TERM GOAL #3   Title  Patient will prepare a warm familiar  meal consisting of at least two items independently    Time  8    Period  Weeks    Status  New      OT LONG TERM GOAL #4   Title  Patient will complete simple grocery shopping and locate and retrieve 5-6 familiar items with min cueing.  Goal does not include driving to store    Time  8  Period  Weeks    Status  New      OT LONG TERM GOAL #5   Title  Patient/caregivers will understand recommendations relating to driving    Time  8    Period  Weeks    Status  New            Plan - 01/09/19 1435    Clinical Impression Statement  Patient is a 68 yr old man S/P L ICH with craniectomy and resection, with complicated hospital course involving difficult vent wean.  Patient home now and he has complete HH therapies.  Patient presents to OT eval with daughter, who is assisting as caregiver.  Patient demonstrates the following impariments; right sided inattention/neglect, right shoulder stiffness, soreness, aphasia, mild apraxia, right hemiparesis, decreased coordiantion, and decreased endurance.   Patient will benefit from skilled OT intervention to help him return to more independent living with less assistance required.    OT Occupational Profile and History  Comprehensive Assessment- Review of records and extensive additional review of physical, cognitive, psychosocial history related to current functional performance    Occupational performance deficits (Please refer to evaluation for details):  ADL's;IADL's;Leisure;Social Participation    Body Structure / Function / Physical Skills  ADL;Coordination;Endurance;GMC;UE functional use;Sensation;Hearing;Decreased knowledge of precautions;Balance;Decreased knowledge of use of DME;IADL;Pain;Vision;Strength;Proprioception;FMC;Dexterity;ROM;Tone    Cognitive Skills  Attention;Safety  Awareness;Sequencing;Problem Solve;Perception    Rehab Potential  Good    Clinical Decision Making  Multiple treatment options, significant modification of task necessary    Comorbidities Affecting Occupational Performance:  May have comorbidities impacting occupational performance    Modification or Assistance to Complete Evaluation   Min-Moderate modification of tasks or assist with assess necessary to complete eval    OT Frequency  2x / week    OT Duration  8 weeks    OT Treatment/Interventions  Self-care/ADL training;Therapeutic exercise;Visual/perceptual remediation/compensation;Patient/family education;Neuromuscular education;Aquatic Therapy;Therapeutic activities;Balance training;Cognitive remediation/compensation;Manual Therapy;DME and/or AE instruction    Plan  Review any HEP for right shoulder, initiate HEP if needed. Need to assess end range - capsular tightness?    Consulted and Agree with Plan of Care  Patient;Family member/caregiver   Patient aphasic      Patient will benefit from skilled therapeutic intervention in order to improve the following deficits and impairments:   Body Structure / Function / Physical Skills: ADL, Coordination, Endurance, GMC, UE functional use, Sensation, Hearing, Decreased knowledge of precautions, Balance, Decreased knowledge of use of DME, IADL, Pain, Vision, Strength, Proprioception, FMC, Dexterity, ROM, Tone Cognitive Skills: Attention, Safety Awareness, Sequencing, Problem Solve, Perception     Visit Diagnosis: Visuospatial deficit - Plan: Ot plan of care cert/re-cert  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 S/P percutaneous endoscopic gastrostomy (PEG) tube placement (HCC)   . 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 CKD (chronic kidney disease), stage III   . Acute blood loss anemia   . Diabetes mellitus type 2 in nonobese (HCC)   . Essential hypertension   . Wernicke's fluent aphasia   .  Acute on chronic respiratory failure with hypoxia (HCC)   . Chronic kidney disease, stage III (moderate)   . Nontraumatic intracranial hemorrhage, unspecified (HCC)   . Altered mental status, unspecified   . S/P AAA repair 02/01/2018    Collier Salina, OTR/L 01/09/2019, 3:00 PM  [facility] [address] [place], [place], [address] Phone: [phone]   Fax:  [phone]  Name: Mark [name] MRN: [id number] Date of Birth: 02/11/1951

## 2019-01-09 NOTE — Patient Instructions (Signed)
Access Code: [email or web address]  URL: [email or web address]  Date: 01/09/2019  Prepared by: Elsie Ra   Exercises  Supine Active Straight Leg Raise - 10 reps - 1-3 sets - 2x daily - 6x weekly  Clamshell with Resistance - 10 reps - 3 sets - 2x daily - 6x weekly  Heel Toe Raises with Counter Support - 10 reps - 2 sets - 2x daily - 6x weekly  Walking Tandem Stance - 2 reps - 2x daily - 6x weekly  Walking with Head Rotation - 2 reps - 2x daily - 6x weekly  Sidestepping in Squat with Resistance and Arms Forward - 10 reps - 1-2 sets - 2x daily - 6x weekly  Sit to Stand without Arm Support - 10 reps - 1-2 sets - 2x daily - 6x weekly  Seated Ankle Dorsiflexion with Anchored Resistance - 10 reps - 3 sets - 2x daily - 6x weekly

## 2019-01-10 NOTE — Patient Instructions (Signed)
Whatever you have been doing at home, continue to do. We will work on more specific homework next session.

## 2019-01-10 NOTE — Therapy (Signed)
[facility] [address] [place], [place], [address] Phone: [phone]   Fax:  [phone]  Speech Language Pathology Evaluation  Patient Details  Name: Mark [name] MRN: [id number] Date of Birth: 1950-08-14 Referring Provider (SLP): Lorelee Cover, MD   Encounter Date: 01/09/2019  End of Session - 01/10/19 1507    Visit Number  1    Number of Visits  17    Date for SLP Re-Evaluation  04/10/19    SLP Start Time  0100    SLP Stop Time   1700    SLP Time Calculation (min)  960 min    Activity Tolerance  Other (comment)   mildly limited by behavior (agitation)      Past Medical History:  Diagnosis Date  . Acute on chronic respiratory failure with hypoxia (HCC)   . Altered mental status, unspecified   . Chronic kidney disease    CKD stage 3  . Chronic kidney disease, stage III (moderate)   . Concussion    multiple in high school  . Coronary artery disease    pt denies  . Diabetes mellitus without complication (HCC)    borderline  . Hypertension   . Nontraumatic intracranial hemorrhage, unspecified (HCC)   . PVD (peripheral vascular disease) (HCC)    leg stents femoral artery  . Stroke [facility])    3 years ago- no residual    Past Surgical History:  Procedure Laterality Date  . ABDOMINAL AORTIC ENDOVASCULAR STENT GRAFT N/A 02/01/2018   Procedure: ABDOMINAL AORTIC ENDOVASCULAR STENT GRAFT;  Surgeon: Nada Libman, MD;  Location: [facility] OR;  Service: Vascular;  Laterality: N/A;  . NECK SURGERY    . VEIN BYPASS SURGERY      There were no vitals filed for this visit.  Subjective Assessment - 01/10/19 1143    Subjective  "What, you want me to do it? It's the same thing." (pt, response to a yes/no question on WAB)    Currently in Pain?  No/denies         SLP Evaluation [facility] - 01/10/19 1145      SLP Visit Information   SLP Received On  01/09/19    Referring Provider (SLP)  Lorelee Cover, MD     Onset Date  Jul 05, 2018      Subjective   Subjective  Pt hard of hearing    Patient/Family Stated Goal  Pt indicates he would like to communicate more successfully.      General Information   HPI  Pt admitted 07-05-18 due to rupture of AVM, lt crani to evacuate after partial embolization of AVM. Pt went back to OR 07-18-18 for resection of residual AVM. PEG placed same day. Pt trached on 07-20-18. Discharge from CIR 09-05-18 with 24-7 care by dtr and son, trading off from local cities to stay a week at a time with father. Pt has had HHST since d/c from CIR.     Behavioral/Cognition  Verbal agitation occasionally especially coupled with poor frustration tolerance.    Mobility Status  PT eval today      Prior Functional Status   Cognitive/Linguistic Baseline  Within functional limits    Type of Home  House     Lives With  Alone   son-daughter providing 24-7 care   Available Support  Family    Vocation  Retired      Cognition   Overall Cognitive Status  --   possible component of  cognitive communication deficit   Behaviors  Verbal agitation;Poor frustration tolerance;Impulsive      Auditory Comprehension   Overall Auditory Comprehension  Impaired    Yes/No Questions  Impaired    Other Yes/No Questions Comments`  --   Logical questions   Commands  Impaired    One Step Basic Commands  --   involving picture drawings (picture ID)   Conversation  Simple    Other Conversation Comments  SLP had to repeat simple biological questions to pt out of context, however difficult to test due to level of pt expressive aphasia.    Interfering Components  Hearing      Expression   Primary Mode of Expression  Verbal      Verbal Expression   Overall Verbal Expression  Impaired    Automatic Speech  --   max cues   Level of Generative/Spontaneous Verbalization  Phrase   rote phrases, single words mostly (intermittently)   Other Verbal Expression Comments  Further testing necessary - to be completed  next session. Conversation is non-functional at present.                      SLP Education - 01/10/19 1507    Education Details  Possible goals, whether pt has been participatory in homework or not    Person(s) Educated  Patient;Child(ren)    Methods  Explanation    Comprehension  Verbalized understanding       SLP Short Term Goals - 01/10/19 1515      SLP SHORT TERM GOAL #1   Title  pt will correctly answer simple questions in context 80% of the time over 3 sessions    Time  4    Period  Weeks    Status  New      SLP SHORT TERM GOAL #2   Title  pt will respond to requests for participation in therapy tasks by SLP in a manner conducive to therpay progressin ST for 6 sessions    Time  4    Period  Weeks    Status  New      SLP SHORT TERM GOAL #3   Title  pt will ID errors in verbal expression tasks by verbal (e.g., self correction, verbal disgust) or non-verbal (d.g., frowning, etc) responses at least 70% of the time in 3 sessions    Time  4    Period  Weeks    Status  New      SLP SHORT TERM GOAL #4   Title  Pt will ID at least 15 words/phrases to practice at home    Time  4    Period  Weeks    Status  New      SLP SHORT TERM GOAL #5   Title  pt will attempt multimodal communication in 5 therapy sessions    Time  4    Period  Weeks    Status  New       SLP Long Term Goals - 01/10/19 1521      SLP LONG TERM GOAL #1   Title  pt will show documentation of home practice between at least 12 sessions    Time  8    Period  Weeks   or 17 sessions, for all LTGs   Status  New      SLP LONG TERM GOAL #2   Title  pt will request repeats for misunderstood comments/questions by SLP or another speaker appropriately  80% of the time    Time  8    Period  Weeks    Status  New      SLP LONG TERM GOAL #3   Title  pt will demo ID of verbal errors by verbal or nonverbal means 85% of the time in 3 therapy sessions    Time  8    Period  Weeks    Status  New       SLP LONG TERM GOAL #4   Title  pt will attempt multimodal communication in 10 therapy sessions    Time  8    Period  Weeks    Status  New       Plan - 01/10/19 1509    Clinical Impression Statement  Pt was initiated [employer] Aphasia Battery today - to be completed next 1-2 sessions. Pt with severe  expressive and receptive aphasia (Wernicke's type?) with jargon/neologism, heavy use of dysnomia and some rote language (cursewords) heard. 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 Pt would benefit from skilled ST to improve communication effectiveness to be able to interact in the community, and reduce caregiver burden.    Speech Therapy Frequency  2x / week    Duration  --   8 weeks or 17 total sessions   Treatment/Interventions  Internal/external aids;Patient/family education;Compensatory strategies;Cognitive reorganization;SLP instruction and feedback;Cueing hierarchy;Language facilitation;Multimodal communcation approach;Functional tasks    Potential to Achieve Goals  Fair    Potential Considerations  Severity of impairments;Cooperation/participation level    Consulted and Agree with Plan of Care  Patient;Family member/caregiver    Family Member Consulted  dtr, [place]       Patient will benefit from skilled therapeutic intervention in order to improve the following deficits and impairments:   Aphasia - Plan: SLP plan of care cert/re-cert  Cognitive communication deficit - Plan: SLP plan of care cert/re-cert    Problem List Patient Active Problem List   Diagnosis Date Noted  . 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 S/P percutaneous endoscopic gastrostomy (PEG) tube placement ([place])   . 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 CKD (chronic kidney disease), stage III   . Acute  blood loss anemia   . Diabetes mellitus type 2 in nonobese (HCC)   . Essential hypertension   . Wernicke's fluent aphasia   . Acute on chronic respiratory failure with hypoxia ([place])   . Chronic kidney disease, stage III (moderate)   . Nontraumatic intracranial hemorrhage, unspecified ([place])   . Altered mental status, unspecified   . S/P AAA repair 02/01/2018    [facility], CCC-SLP  01/10/2019, 3:33 PM  [facility] [address] [place], [place], [address] Phone: [phone]   Fax:  [phone]  Name: Mark [name] MRN: [id number] Date of Birth: 07/14/1950

## 2019-01-14 ENCOUNTER — Ambulatory Visit: Payer: Medicare HMO | Admitting: Speech Pathology

## 2019-01-14 ENCOUNTER — Other Ambulatory Visit: Payer: Self-pay

## 2019-01-14 DIAGNOSIS — R278 Other lack of coordination: Secondary | ICD-10-CM | POA: Diagnosis not present

## 2019-01-14 DIAGNOSIS — R41842 Visuospatial deficit: Secondary | ICD-10-CM | POA: Diagnosis not present

## 2019-01-14 DIAGNOSIS — R41841 Cognitive communication deficit: Secondary | ICD-10-CM | POA: Diagnosis not present

## 2019-01-14 DIAGNOSIS — I69351 Hemiplegia and hemiparesis following cerebral infarction affecting right dominant side: Secondary | ICD-10-CM | POA: Diagnosis not present

## 2019-01-14 DIAGNOSIS — I69151 Hemiplegia and hemiparesis following nontraumatic intracerebral hemorrhage affecting right dominant side: Secondary | ICD-10-CM | POA: Diagnosis not present

## 2019-01-14 DIAGNOSIS — R4701 Aphasia: Secondary | ICD-10-CM

## 2019-01-14 DIAGNOSIS — I69115 Cognitive social or emotional deficit following nontraumatic intracerebral hemorrhage: Secondary | ICD-10-CM | POA: Diagnosis not present

## 2019-01-14 DIAGNOSIS — M25611 Stiffness of right shoulder, not elsewhere classified: Secondary | ICD-10-CM | POA: Diagnosis not present

## 2019-01-14 DIAGNOSIS — M6281 Muscle weakness (generalized): Secondary | ICD-10-CM | POA: Diagnosis not present

## 2019-01-14 DIAGNOSIS — R2689 Other abnormalities of gait and mobility: Secondary | ICD-10-CM | POA: Diagnosis not present

## 2019-01-15 NOTE — Therapy (Signed)
[facility] [address] [place], [place], [address] Phone: [phone]   Fax:  [phone]  Speech Language Pathology Treatment  Patient Details  Name: Mark [name] MRN: [id number] Date of Birth: 1950-11-22 Referring Provider (SLP): Murriel Hopper, MD   Encounter Date: 01/14/2019  End of Session - 01/15/19 0938    Visit Number  2    Number of Visits  17    Date for SLP Re-Evaluation  04/10/19    SLP Start Time  1400    SLP Stop Time   1445    SLP Time Calculation (min)  45 min    Activity Tolerance  Other (comment)   mildly limited by pt verbal agitation/frustration      Past Medical History:  Diagnosis Date  . Acute on chronic respiratory failure with hypoxia ([place])   . Altered mental status, unspecified   . Chronic kidney disease    CKD stage 3  . Chronic kidney disease, stage III (moderate)   . Concussion    multiple in high school  . Coronary artery disease    pt denies  . Diabetes mellitus without complication (HCC)    borderline  . Hypertension   . Nontraumatic intracranial hemorrhage, unspecified ([place])   . PVD (peripheral vascular disease) (HCC)    leg stents femoral artery  . Stroke [facility])    3 years ago- no residual    Past Surgical History:  Procedure Laterality Date  . ABDOMINAL AORTIC ENDOVASCULAR STENT GRAFT N/A 02/01/2018   Procedure: ABDOMINAL AORTIC ENDOVASCULAR STENT GRAFT;  Surgeon: Serafina Mitchell, MD;  Location: [facility];  Service: Vascular;  Laterality: N/A;  . NECK SURGERY    . VEIN BYPASS SURGERY      There were no vitals filed for this visit.  Subjective Assessment - 01/15/19 0922    Subjective  "I can't hear what you're saying," (auditory comp vs hearing)    Patient is accompained by:  Family member    Currently in Pain?  No/denies            ADULT SLP TREATMENT - 01/15/19 0925      General Information   Behavior/Cognition  Alert   verbally agitated, poor  frustration tolerance     Treatment Provided   Treatment provided  Cognitive-Linquistic      Pain Assessment   Pain Assessment  No/denies pain      Cognitive-Linquistic Treatment   Treatment focused on  Aphasia;Patient/family/caregiver education    Skilled Treatment  SLP used visual aids and written keywords for introduction to pt. Pt able to confirm name and birthdate when written on whiteboard. Pt easily frustrated when SLP requested clarification or did not understand pt's fluent jargon. SLP used pt topic of interest (football) for building rapport. Pt with rare islands of fluent speech (overlearned/high frequency utterances) "We like [place]." From written choice of keywords, pt pointed to "college" when asked if he preferred college or NFL teams. Continued with administration of WAB-R. Pt with increasing verbal frustration as assessment progressed, more so with auditory comprehension tasks ("I can't hear you." "It's what you just said.") Pt without awareness of expressive errors (repetition or naming). SLP removed mask and used faceshield/clear barrier for visual cuing which did not appear to have a significant impact on pt comprehension or imitation. Son asked what to work on at home with patient. SLP set up [place] account for pt and added level 1 comprehension tasks for home practice.  Assessment / Recommendations / Plan   Plan  Continue with current plan of care      Progression Toward Goals   Progression toward goals  Progressing toward goals       SLP Education - 01/15/19 0937    Education Details  TalkPath tasks to try with patient    Person(s) Educated  Patient;Child(ren)    Methods  Explanation    Comprehension  Verbalized understanding       SLP Short Term Goals - 01/15/19 0943      SLP SHORT TERM GOAL #1   Title  pt will correctly answer simple questions in context 80% of the time over 3 sessions    Time  4    Period  Weeks    Status  On-going      SLP  SHORT TERM GOAL #2   Title  pt will respond to requests for participation in therapy tasks by SLP in a manner conducive to therapy progressin ST for 6 sessions    Time  4    Period  Weeks    Status  On-going      SLP SHORT TERM GOAL #3   Title  pt will ID errors in verbal expression tasks by verbal (e.g., self correction, verbal disgust) or non-verbal (d.g., frowning, etc) responses at least 70% of the time in 3 sessions    Time  4    Period  Weeks    Status  On-going      SLP SHORT TERM GOAL #4   Title  Pt will ID at least 15 words/phrases to practice at home    Time  4    Period  Weeks    Status  On-going      SLP SHORT TERM GOAL #5   Title  pt will attempt multimodal communication in 5 therapy sessions    Time  4    Period  Weeks    Status  On-going       SLP Long Term Goals - 01/15/19 0944      SLP LONG TERM GOAL #1   Title  pt will show documentation of home practice between at least 12 sessions    Time  8    Period  Weeks   or 17 sessions, for all LTGs   Status  On-going      SLP LONG TERM GOAL #2   Title  pt will request repeats for misunderstood comments/questions by SLP or another speaker appropriately 80% of the time    Time  8    Period  Weeks    Status  On-going      SLP LONG TERM GOAL #3   Title  pt will demo ID of verbal errors by verbal or nonverbal means 85% of the time in 3 therapy sessions    Time  8    Period  Weeks    Status  On-going      SLP LONG TERM GOAL #4   Title  pt will attempt multimodal communication in 10 therapy sessions    Time  8    Period  Weeks    Status  On-going       Plan - 01/15/19 0939    Clinical Impression Statement  Continued [employer] Aphasia Battery today - consider holding completion of assessment due to pt agitation which may have potential to impact therapeutic relationship. SLP feels with current pt performance, pt most likely falls under differential diagnosis of severe Wernicke's aphasia.  Pt with severe   expressive and receptive aphasia  with jargon/neologism, heavy use of dysnomia and some rote language (cursewords) heard. 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 Pt would benefit from skilled ST to improve communication effectiveness to be able to interact in the community, and reduce caregiver burden.    Speech Therapy Frequency  2x / week    Duration  --   8 weeks or 17 total sessions   Treatment/Interventions  Internal/external aids;Patient/family education;Compensatory strategies;Cognitive reorganization;SLP instruction and feedback;Cueing hierarchy;Language facilitation;Multimodal communcation approach;Functional tasks    Potential to Achieve Goals  Fair    Potential Considerations  Severity of impairments;Cooperation/participation level    Consulted and Agree with Plan of Care  Patient;Family member/caregiver    Family Member Consulted  dtr, Bikey       Patient will benefit from skilled therapeutic intervention in order to improve the following deficits and impairments:   Aphasia  Cognitive communication deficit    Problem List Patient Active Problem List   Diagnosis Date Noted  . 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 S/P percutaneous endoscopic gastrostomy (PEG) tube placement (HCC)   . 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 CKD (chronic kidney disease), stage III   . Acute blood loss anemia   . Diabetes mellitus type 2 in nonobese (HCC)   . Essential hypertension   . Wernicke's fluent aphasia   . Acute on chronic respiratory failure with hypoxia (HCC)   . Chronic kidney disease, stage III (moderate)   . Nontraumatic intracranial hemorrhage, unspecified (HCC)   . Altered mental status, unspecified   . S/P AAA repair 02/01/2018   Rondel Baton, MS, CCC-SLP Speech-Language  Pathologist  Arlana Lindau 01/15/2019, 9:45 AM  [facility] [address] [place], [place], [address] Phone: [phone]   Fax:  [phone]   Name: Mark [name] MRN: [id number] Date of Birth: 09/13/1950

## 2019-01-17 ENCOUNTER — Ambulatory Visit: Payer: Medicare HMO

## 2019-01-17 ENCOUNTER — Other Ambulatory Visit: Payer: Self-pay

## 2019-01-17 DIAGNOSIS — I69351 Hemiplegia and hemiparesis following cerebral infarction affecting right dominant side: Secondary | ICD-10-CM | POA: Diagnosis not present

## 2019-01-17 DIAGNOSIS — I69115 Cognitive social or emotional deficit following nontraumatic intracerebral hemorrhage: Secondary | ICD-10-CM | POA: Diagnosis not present

## 2019-01-17 DIAGNOSIS — R41841 Cognitive communication deficit: Secondary | ICD-10-CM | POA: Diagnosis not present

## 2019-01-17 DIAGNOSIS — M6281 Muscle weakness (generalized): Secondary | ICD-10-CM | POA: Diagnosis not present

## 2019-01-17 DIAGNOSIS — M25611 Stiffness of right shoulder, not elsewhere classified: Secondary | ICD-10-CM | POA: Diagnosis not present

## 2019-01-17 DIAGNOSIS — I69151 Hemiplegia and hemiparesis following nontraumatic intracerebral hemorrhage affecting right dominant side: Secondary | ICD-10-CM | POA: Diagnosis not present

## 2019-01-17 DIAGNOSIS — R278 Other lack of coordination: Secondary | ICD-10-CM | POA: Diagnosis not present

## 2019-01-17 DIAGNOSIS — R4701 Aphasia: Secondary | ICD-10-CM

## 2019-01-17 DIAGNOSIS — R41842 Visuospatial deficit: Secondary | ICD-10-CM | POA: Diagnosis not present

## 2019-01-17 DIAGNOSIS — R2689 Other abnormalities of gait and mobility: Secondary | ICD-10-CM | POA: Diagnosis not present

## 2019-01-17 NOTE — Therapy (Signed)
[name] [address] [place], [place], [address] Phone: [phone]   Fax:  [phone]  Speech Language Pathology Treatment  Patient Details  Name: Mark [name] MRN: [id number] Date of Birth: [birth year] Referring Provider (SLP): Lorelee Cover, MD   Encounter Date: 01/17/2019  End of Session - 01/17/19 1703    Number of Visits  17    Date for SLP Re-Evaluation  04/10/19    SLP Start Time  1021    SLP Stop Time   1101    SLP Time Calculation (min)  40 min    Activity Tolerance  Patient tolerated treatment well       Past Medical History:  Diagnosis Date  . Acute on chronic respiratory failure with hypoxia (HCC)   . Altered mental status, unspecified   . Chronic kidney disease    CKD stage 3  . Chronic kidney disease, stage III (moderate)   . Concussion    multiple in high school  . Coronary artery disease    pt denies  . Diabetes mellitus without complication (HCC)    borderline  . Hypertension   . Nontraumatic intracranial hemorrhage, unspecified (HCC)   . PVD (peripheral vascular disease) (HCC)    leg stents femoral artery  . Stroke [facility])    3 years ago- no residual    Past Surgical History:  Procedure Laterality Date  . ABDOMINAL AORTIC ENDOVASCULAR STENT GRAFT N/A 02/01/2018   Procedure: ABDOMINAL AORTIC ENDOVASCULAR STENT GRAFT;  Surgeon: Nada Libman, MD;  [facility];  Service: Vascular;  Laterality: N/A;  . NECK SURGERY    . VEIN BYPASS SURGERY      There were no vitals filed for this visit.  Subjective Assessment - 01/17/19 1026    Subjective  Next door? (pt, re: location last session)            ADULT SLP TREATMENT - 01/17/19 1658      General Information   Behavior/Cognition  Alert;Cooperative      Treatment Provided   Treatment provided  Cognitive-Linquistic      Pain Assessment   Pain Assessment  No/denies pain      Cognitive-Linquistic Treatment   Treatment focused on  Aphasia;Patient/family/caregiver education    Skilled Treatment  SLP did not cont formal testing as pt appeared too contentious re: evaluation tasks and need to develop rapport with pt outweighed any information gained by formal testing at this time. Testing may be completed at a later date when rapport has become more established. SLP used visual cues in word/short phrase form to assist pt comprehnsion of langauge - SLP augmented conversational therapy with pt with Google maps, gleaning information from pt re: college, previous jobs. Daughter, Nyra Capes, present and observed SLP use written words to augment pt comprehension. SLP strongly suggested daughter have conversation with pt about common knowledge topics to provide pt some success with conversation and to allow him more practice time with cues from dtr PRN (she would be able to cue when needed due to shared knowledge). Dtr thanked SLP.       Assessment / Recommendations / Plan   Plan  Continue with current plan of care      Progression Toward Goals   Progression toward goals  Progressing toward goals       SLP Education - 01/17/19 1702    Education Details  how to structure conversation for therapeutic means    Person(s) Educated  Patient;Child(ren)  Methods  Explanation;Demonstration    Comprehension  Verbalized understanding;Need further instruction       SLP Short Term Goals - 01/17/19 1705      SLP SHORT TERM GOAL #1   Title  pt will correctly answer simple questions in context 80% of the time over 3 sessions    Time  4    Period  Weeks    Status  On-going      SLP SHORT TERM GOAL #2   Title  pt will respond to requests for participation in therapy tasks by SLP in a manner conducive to therapy progressin ST for 6 sessions    Time  4    Period  Weeks    Status  On-going      SLP SHORT TERM GOAL #3   Title  pt will ID errors in verbal expression tasks by verbal (e.g., self correction, verbal disgust) or  non-verbal (d.g., frowning, etc) responses at least 70% of the time in 3 sessions    Time  4    Period  Weeks    Status  On-going      SLP SHORT TERM GOAL #4   Title  Pt will ID at least 15 words/phrases to practice at home    Time  4    Period  Weeks    Status  On-going      SLP SHORT TERM GOAL #5   Title  pt will attempt multimodal communication in 5 therapy sessions    Time  4    Period  Weeks    Status  On-going       SLP Long Term Goals - 01/17/19 [place] #1   Title  pt will show documentation of home practice between at least 12 sessions    Time  8    Period  Weeks   or 17 sessions, for all LTGs   Status  On-going      SLP LONG TERM GOAL #2   Title  pt will request repeats for misunderstood comments/questions by SLP or another speaker appropriately 80% of the time    Time  8    Period  Weeks    Status  On-going      SLP LONG TERM GOAL #3   Title  pt will demo ID of verbal errors by verbal or nonverbal means 85% of the time in 3 therapy sessions    Time  8    Period  Weeks    Status  On-going      SLP LONG TERM GOAL #4   Title  pt will attempt multimodal communication in 10 therapy sessions    Time  8    Period  Weeks    Status  On-going       Plan - 01/17/19 1703    Clinical Impression Statement  SLP conducted therapy today in conversational format with augmentation of written single words and use of websites for pt comprehension. Pt likely falls under differential diagnosis of severe Wernicke's aphasia. Pt would benefit from skilled ST to improve communication effectiveness to be able to interact in the community, and reduce caregiver burden.    Speech Therapy Frequency  2x / week    Duration  --   8 weeks or 17 total sessions   Treatment/Interventions  Internal/external aids;Patient/family education;Compensatory strategies;Cognitive reorganization;SLP instruction and feedback;Cueing hierarchy;Language facilitation;Multimodal  communcation approach;Functional tasks    Potential to [place]  Potential Considerations  Severity of impairments;Cooperation/participation level    Consulted and Agree with Plan of Care  Patient;Family member/caregiver    Family Member Consulted  dtr, Bikey       Patient will benefit from skilled therapeutic intervention in order to improve the following deficits and impairments:   Aphasia  Cognitive communication deficit    Problem List Patient Active Problem List   Diagnosis Date Noted  . 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 S/P percutaneous endoscopic gastrostomy (PEG) tube placement (HCC)   . 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 CKD (chronic kidney disease), stage III   . Acute blood loss anemia   . Diabetes mellitus type 2 in nonobese (HCC)   . Essential hypertension   . Wernicke's fluent aphasia   . Acute on chronic respiratory failure with hypoxia (HCC)   . Chronic kidney disease, stage III (moderate)   . Nontraumatic intracranial hemorrhage, unspecified (HCC)   . Altered mental status, unspecified   . S/P AAA repair 02/01/2018    [facility] ,MS, CCC-SLP  01/17/2019, 5:05 PM  [facility] [address] [place], [place], [address] Phone: [phone]   Fax:  [phone]   Name: Mark [name] MRN: [id number] Date of Birth: 1951-01-15

## 2019-01-20 ENCOUNTER — Ambulatory Visit: Payer: Medicare HMO

## 2019-01-20 ENCOUNTER — Other Ambulatory Visit: Payer: Self-pay

## 2019-01-20 DIAGNOSIS — I69115 Cognitive social or emotional deficit following nontraumatic intracerebral hemorrhage: Secondary | ICD-10-CM | POA: Diagnosis not present

## 2019-01-20 DIAGNOSIS — I69351 Hemiplegia and hemiparesis following cerebral infarction affecting right dominant side: Secondary | ICD-10-CM | POA: Diagnosis not present

## 2019-01-20 DIAGNOSIS — R278 Other lack of coordination: Secondary | ICD-10-CM | POA: Diagnosis not present

## 2019-01-20 DIAGNOSIS — R41841 Cognitive communication deficit: Secondary | ICD-10-CM | POA: Diagnosis not present

## 2019-01-20 DIAGNOSIS — R2689 Other abnormalities of gait and mobility: Secondary | ICD-10-CM | POA: Diagnosis not present

## 2019-01-20 DIAGNOSIS — I69151 Hemiplegia and hemiparesis following nontraumatic intracerebral hemorrhage affecting right dominant side: Secondary | ICD-10-CM | POA: Diagnosis not present

## 2019-01-20 DIAGNOSIS — M25611 Stiffness of right shoulder, not elsewhere classified: Secondary | ICD-10-CM | POA: Diagnosis not present

## 2019-01-20 DIAGNOSIS — M6281 Muscle weakness (generalized): Secondary | ICD-10-CM | POA: Diagnosis not present

## 2019-01-20 DIAGNOSIS — R4701 Aphasia: Secondary | ICD-10-CM

## 2019-01-20 DIAGNOSIS — R41842 Visuospatial deficit: Secondary | ICD-10-CM | POA: Diagnosis not present

## 2019-01-21 NOTE — Therapy (Signed)
[facility] [address] [place], [place], [address] Phone: [phone]   Fax:  [phone]  Speech Language Pathology Treatment  Patient Details  Name: Mark [name] MRN: [id number] Date of Birth: 19-Jun-1950 Referring Provider (SLP): Lorelee Cover, MD   Encounter Date: 01/20/2019  End of Session - 01/21/19 0927    Visit Number  4    Number of Visits  17    Date for SLP Re-Evaluation  04/10/19    SLP Start Time  1103    SLP Stop Time   1145    SLP Time Calculation (min)  42 min    Activity Tolerance  Patient tolerated treatment well       Past Medical History:  Diagnosis Date  . Acute on chronic respiratory failure with hypoxia (HCC)   . Altered mental status, unspecified   . Chronic kidney disease    CKD stage 3  . Chronic kidney disease, stage III (moderate)   . Concussion    multiple in high school  . Coronary artery disease    pt denies  . Diabetes mellitus without complication (HCC)    borderline  . Hypertension   . Nontraumatic intracranial hemorrhage, unspecified (HCC)   . PVD (peripheral vascular disease) (HCC)    leg stents femoral artery  . Stroke [facility])    3 years ago- no residual    Past Surgical History:  Procedure Laterality Date  . ABDOMINAL AORTIC ENDOVASCULAR STENT GRAFT N/A 02/01/2018   Procedure: ABDOMINAL AORTIC ENDOVASCULAR STENT GRAFT;  Surgeon: Nada Libman, MD;  [facility];  Service: Vascular;  Laterality: N/A;  . NECK SURGERY    . VEIN BYPASS SURGERY      There were no vitals filed for this visit.         ADULT SLP TREATMENT - 01/21/19 0001      General Information   Behavior/Cognition  Alert;Agitated   mild verbal and nonverbal agitation     Treatment Provided   Treatment provided  Cognitive-Linquistic      Cognitive-Linquistic Treatment   Treatment focused on  Aphasia;Patient/family/caregiver education    Skilled Treatment  Pt arrives with son  Mark [name] today. SLP targeted accuracy with family names as son brought picture album made when pt was in hospital.  SLP used written support for pt copmrehension. Pt uanble to name spouses and grandchildren's names - visibly and verbally expressed frustration with this. SLP reiterated with gestural and written support that pt will need to work with SLPs in order to allow him best opportunity to improve language expression. SLP attempted to validate pt's frustration about his limited verbal expression - SLP explained this to pt in number of ways using augmentation (written, gestural). Pt calmed down by session end and appeared to understand and agree with work with SLP despite a frustrating situation.       Assessment / Recommendations / Plan   Plan  Continue with current plan of care      Progression Toward Goals   Progression toward goals  Progressing toward goals       SLP Education - 01/21/19 0926    Education Details  pt will need to put forth effort even though it might be frustrating    Person(s) Educated  Patient;Child(ren)    Methods  Explanation;Demonstration;Verbal cues;Other (comment)   written, gestural cues   Comprehension  Verbalized understanding;Verbal cues required;Need further instruction       SLP Short Term Goals -  01/21/19 0927      SLP SHORT TERM GOAL #1   Title  pt will correctly answer simple questions in context 80% of the time over 3 sessions    Time  3    Period  Weeks    Status  On-going      SLP SHORT TERM GOAL #2   Title  pt will respond to requests for participation in therapy tasks by SLP in a manner conducive to therapy progressin ST for 6 sessions    Time  3    Period  Weeks    Status  On-going      SLP SHORT TERM GOAL #3   Title  pt will ID errors in verbal expression tasks by verbal (e.g., self correction, verbal disgust) or non-verbal (d.g., frowning, etc) responses at least 70% of the time in 3 sessions    Time  3    Period  Weeks    Status   On-going      SLP SHORT TERM GOAL #4   Title  Pt will ID at least 15 words/phrases to practice at home    Time  3    Period  Weeks    Status  On-going      SLP SHORT TERM GOAL #5   Title  pt will attempt multimodal communication in 5 therapy sessions    Time  3    Period  Weeks    Status  On-going       SLP Long Term Goals - 01/21/19 8563      SLP LONG TERM GOAL #1   Title  pt will show documentation of home practice between at least 12 sessions    Time  7    Period  Weeks   or 17 sessions, for all LTGs   Status  On-going      SLP LONG TERM GOAL #2   Title  pt will request repeats for misunderstood comments/questions by SLP or another speaker appropriately 80% of the time    Time  7    Period  Weeks    Status  On-going      SLP LONG TERM GOAL #3   Title  pt will demo ID of verbal errors by verbal or nonverbal means 85% of the time in 3 therapy sessions    Time  7    Period  Weeks    Status  On-going      SLP LONG TERM GOAL #4   Title  pt will attempt multimodal communication in 10 therapy sessions    Time  7    Period  Weeks    Status  On-going       Plan - 01/21/19 1497    Clinical Impression Statement  SLP cont'd to conduct therapy today in conversational format with augmentation of written single words and use of gestures for pt comprehension. Pt likely falls under differential diagnosis of severe Wernicke's aphasia. Pt would benefit from skilled ST to improve communication effectiveness to be able to interact in the community, and reduce caregiver burden.    Speech Therapy Frequency  2x / week    Duration  --   8 weeks or 17 total sessions   Treatment/Interventions  Internal/external aids;Patient/family education;Compensatory strategies;Cognitive reorganization;SLP instruction and feedback;Cueing hierarchy;Language facilitation;Multimodal communcation approach;Functional tasks    Potential to Achieve Goals  Fair    Potential Considerations  Severity of  impairments;Cooperation/participation level    Consulted and Agree with Plan of Care  Patient;Family member/caregiver    Family Member Consulted  dtr, Bikey       Patient will benefit from skilled therapeutic intervention in order to improve the following deficits and impairments:   Aphasia  Cognitive communication deficit    Problem List Patient Active Problem List   Diagnosis Date Noted  . 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 S/P percutaneous endoscopic gastrostomy (PEG) tube placement (HCC)   . 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 CKD (chronic kidney disease), stage III   . Acute blood loss anemia   . Diabetes mellitus type 2 in nonobese (HCC)   . Essential hypertension   . Wernicke's fluent aphasia   . Acute on chronic respiratory failure with hypoxia (HCC)   . Chronic kidney disease, stage III (moderate)   . Nontraumatic intracranial hemorrhage, unspecified (HCC)   . Altered mental status, unspecified   . S/P AAA repair 02/01/2018    [facility] ,MS, CCC-SLP  01/21/2019, 9:28 AM  [facility] [address] [place], [place], [address] Phone: [phone]   Fax:  [phone]   Name: Antwaun Buth MRN: [id number] Date of Birth: 1950-11-03

## 2019-01-31 ENCOUNTER — Encounter: Payer: Self-pay | Admitting: Physical Therapy

## 2019-01-31 ENCOUNTER — Ambulatory Visit: Payer: Medicare HMO | Admitting: Occupational Therapy

## 2019-01-31 ENCOUNTER — Other Ambulatory Visit: Payer: Self-pay

## 2019-01-31 ENCOUNTER — Encounter: Payer: Self-pay | Admitting: Occupational Therapy

## 2019-01-31 ENCOUNTER — Ambulatory Visit: Payer: Medicare HMO | Attending: Student | Admitting: Physical Therapy

## 2019-01-31 DIAGNOSIS — R41841 Cognitive communication deficit: Secondary | ICD-10-CM | POA: Insufficient documentation

## 2019-01-31 DIAGNOSIS — R278 Other lack of coordination: Secondary | ICD-10-CM

## 2019-01-31 DIAGNOSIS — R4701 Aphasia: Secondary | ICD-10-CM | POA: Insufficient documentation

## 2019-01-31 DIAGNOSIS — M6281 Muscle weakness (generalized): Secondary | ICD-10-CM

## 2019-01-31 DIAGNOSIS — I69115 Cognitive social or emotional deficit following nontraumatic intracerebral hemorrhage: Secondary | ICD-10-CM | POA: Diagnosis not present

## 2019-01-31 DIAGNOSIS — R41842 Visuospatial deficit: Secondary | ICD-10-CM

## 2019-01-31 DIAGNOSIS — I69151 Hemiplegia and hemiparesis following nontraumatic intracerebral hemorrhage affecting right dominant side: Secondary | ICD-10-CM | POA: Diagnosis not present

## 2019-01-31 DIAGNOSIS — R2689 Other abnormalities of gait and mobility: Secondary | ICD-10-CM | POA: Diagnosis not present

## 2019-01-31 DIAGNOSIS — M25611 Stiffness of right shoulder, not elsewhere classified: Secondary | ICD-10-CM | POA: Diagnosis not present

## 2019-01-31 NOTE — Therapy (Signed)
[place] [address] [place], [place], [address] Phone: [phone]   Fax:  [phone]  Occupational Therapy Treatment  Patient Details  Name: Mark [name] MRN: [id number] Date of Birth: 22-May-1950 No data recorded  Encounter Date: 01/31/2019  OT End of Session - 01/31/19 1627    Visit Number  2    Number of Visits  17    Date for OT Re-Evaluation  03/20/19    Authorization Type  [employer] Medicare - need 10th visit PN    Authorization - Visit Number  2    Authorization - Number of Visits  10    OT Start Time  1530    OT Stop Time  1612    OT Time Calculation (min)  42 min    Activity Tolerance  Patient tolerated treatment well    Behavior During Therapy  [facility] for tasks assessed/performed       Past Medical History:  Diagnosis Date  . Acute on chronic respiratory failure with hypoxia ([place])   . Altered mental status, unspecified   . Chronic kidney disease    CKD stage 3  . Chronic kidney disease, stage III (moderate)   . Concussion    multiple in high school  . Coronary artery disease    pt denies  . Diabetes mellitus without complication (HCC)    borderline  . Hypertension   . Nontraumatic intracranial hemorrhage, unspecified ([place])   . PVD (peripheral vascular disease) (HCC)    leg stents femoral artery  . Stroke [facility])    3 years ago- no residual    Past Surgical History:  Procedure Laterality Date  . ABDOMINAL AORTIC ENDOVASCULAR STENT GRAFT N/A 02/01/2018   Procedure: ABDOMINAL AORTIC ENDOVASCULAR STENT GRAFT;  Surgeon: Serafina Mitchell, MD;  Location: [place];  Service: Vascular;  Laterality: N/A;  . NECK SURGERY    . VEIN BYPASS SURGERY      There were no vitals filed for this visit.                OT Treatments/Exercises (OP) - 01/31/19 0001      ADLs   Cooking  Per patient's daughter, if meals are prepared, patient is able to heat in microwave.  Patient able to get his own  beverages.  They have not attempted any cooking on stovetop with patient - will try in therapy first.      ADL Comments  Family is testing leaving patient alone for longer periods of time.  Patient able to take his own medicine.        Visual/Perceptual Exercises   Other Exercises  Worked on sorting deck of cards from low to high to force right env't scanning.  Patient forced to look right by task.  Daughter indicates they have a sequence board game at home where he has to randomly scan visually.        Neurological Re-education Exercises   Other Exercises 1  Patient with end range shoulder tightness.  Has 140 degrees of shoulder flexion in supine.  Daughter and patient indicate that he has occasional pain in both shoulders.  (160 degrees flex in left shoulder)  Quadruped to address stretch towards childs pose.  Patient with some wrist discomfort Patient with digit and wrist tightness restricting full active range of motion, although respoinded well to stretch.      Hand Gripper with Large Beads  Hand gripper on second setting with emphasis on forcing scanning to  right.               OT Education - 01/31/19 1626    Education Details  Reviewed importance of use of putty to build hand strength (Right grip 42 lb, Left grip 60+ lb)    Person(s) Educated  Patient;Child(ren)    Methods  Explanation    Comprehension  Need further instruction       OT Short Term Goals - 01/09/19 1449      OT SHORT TERM GOAL #1   Title  Patient will complete a HEP designed to improve range of motion in RUE    Time  4    Period  Weeks    Status  New    Target Date  02/13/19      OT SHORT TERM GOAL #2   Title  Patient will demonstrate safe use of stove top to prepare a warm meal with supervision    Time  4    Period  Weeks    Status  New      OT SHORT TERM GOAL #3   Title  Patient will bathe and dress himself appropriately for the weather with no more than minimal prompting    Time  4    Period   Weeks    Status  New      OT SHORT TERM GOAL #4   Title  Patient will navigate through minimally crowded hallway avoiding obstacles with minimal prompting    Time  4    Period  Weeks    Status  New        OT Long Term Goals - 01/09/19 1453      OT LONG TERM GOAL #1   Title  Patient will complete an updated HEP to address right UE strength    Time  8    Period  Weeks    Status  New    Target Date  03/20/19      OT LONG TERM GOAL #2   Title  Patient will be able to be left alone for 8+ hours during daytime, and manage his own needs.    Time  8    Period  Weeks    Status  New      OT LONG TERM GOAL #3   Title  Patient will prepare a warm familiar  meal consisting of at least two items independently    Time  8    Period  Weeks    Status  New      OT LONG TERM GOAL #4   Title  Patient will complete simple grocery shopping and locate and retrieve 5-6 familiar items with min cueing.  Goal does not include driving to store    Time  8    Period  Weeks    Status  New      OT LONG TERM GOAL #5   Title  Patient/caregivers will understand recommendations relating to driving    Time  8    Period  Weeks    Status  New            Plan - 01/31/19 1628    Clinical Impression Statement  Patient has had delay in starting OT, although daughter indicates that things are going well at home, and patient continues with ADL participation with little help    OT Frequency  2x / week    OT Duration  8 weeks    OT Treatment/Interventions  Self-care/ADL training;Therapeutic exercise;Visual/perceptual  remediation/compensation;Patient/family education;Neuromuscular education;Aquatic Therapy;Therapeutic activities;Balance training;Cognitive remediation/compensation;Manual Therapy;DME and/or AE instruction    Plan  environmental scanning - locating cards and putting in order, could assess safety with stovetop - would expect he would need supervision and cueing as it is not like his stove    OT  Home Exercise Plan  has putty exercises from prior therapy    Consulted and Agree with Plan of Care  Patient;Family member/caregiver    Family Member Consulted  daughter       Patient will benefit from skilled therapeutic intervention in order to improve the following deficits and impairments:           Visit Diagnosis: Visuospatial deficit  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 S/P percutaneous endoscopic gastrostomy (PEG) tube placement (HCC)   . 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 CKD (chronic kidney disease), stage III   . Acute blood loss anemia   . Diabetes mellitus type 2 in nonobese (HCC)   . Essential hypertension   . Wernicke's fluent aphasia   . Acute on chronic respiratory failure with hypoxia (HCC)   . Chronic kidney disease, stage III (moderate)   . Nontraumatic intracranial hemorrhage, unspecified (HCC)   . Altered mental status, unspecified   . S/P AAA repair 02/01/2018    Collier Salina, OTR/L 01/31/2019, 4:33 PM   [facility] [address] [place], [place], [address] Phone: [phone]   Fax:  [phone]  Name: Mark [name] MRN: [id number] Date of Birth: [birth year]

## 2019-02-02 DIAGNOSIS — Z01818 Encounter for other preprocedural examination: Secondary | ICD-10-CM | POA: Diagnosis not present

## 2019-02-02 NOTE — Therapy (Addendum)
[facility] [address] [place], [place], [address] Phone: [phone]   Fax:  [phone]  Physical Therapy Treatment  Patient Details  Name: Mark [name] MRN: [id number] Date of Birth: 06/06/1950 No data recorded  Encounter Date: 01/31/2019     02/02/19 1424  PT Visits / Re-Eval  Visit Number 2  Number of Visits 8  Date for PT Re-Evaluation 02/20/19  Authorization  Authorization Type [employer] [facility]  PT Time Calculation  PT Start Time 1445  PT Stop Time 1527  PT Time Calculation (min) 42 min  PT - End of Session  Equipment Utilized During Treatment Gait belt  Activity Tolerance Patient tolerated treatment well  Behavior During Therapy [facility] for tasks assessed/performed    Past Medical History:  Diagnosis Date  . Acute on chronic respiratory failure with hypoxia (HCC)   . Altered mental status, unspecified   . Chronic kidney disease    CKD stage 3  . Chronic kidney disease, stage III (moderate)   . Concussion    multiple in high school  . Coronary artery disease    pt denies  . Diabetes mellitus without complication (HCC)    borderline  . Hypertension   . Nontraumatic intracranial hemorrhage, unspecified (HCC)   . PVD (peripheral vascular disease) (HCC)    leg stents femoral artery  . Stroke [facility])    3 years ago- no residual    Past Surgical History:  Procedure Laterality Date  . ABDOMINAL AORTIC ENDOVASCULAR STENT GRAFT N/A 02/01/2018   Procedure: ABDOMINAL AORTIC ENDOVASCULAR STENT GRAFT;  Surgeon: Nada Libman, MD;  [facility];  Service: Vascular;  Laterality: N/A;  . NECK SURGERY    . VEIN BYPASS SURGERY      There were no vitals filed for this visit.      01/31/19 1449  Symptoms/Limitations  Subjective No falls. Has been doing the exercises a little bit. Daughter states that he tends to veer to the right. Reports he is overall just very weak.  Patient is accompained by:   (daughter Pension scheme manager)  Pertinent History history of HTN, AAA, ASCVD, CVA 3 years ago and on 07/05/2018,difficulty weaning from vent  How long can you stand comfortably? unsure  How long can you walk comfortably? unsure  Patient Stated Goals daugher wants to improve strength, balance, endurance, cognition  Pain Assessment  Currently in Pain? No/denies       NMR:  On blue foam standing at edge of mat:  -sit <> stands 2 x 10 reps, focus on not using UE and focus on slowed eccentric control, best cue for pt was visually demonstrating arms crossed to indicate no use of UE  On blue foam in // bars: -eyes open:  Feet together 2 x 30 seconds, feet together 2 x 10 reps head nods, 2 x 10 reps head turns -eyes closed: wider BOS progressing to more narrow BOS reps of 20-30 seconds, with feet together 2 x 15-20 second reps, with wider BOS and eyes closed 2 x 5 reps head nods, 2 x 5 reps head turns -SLS double cone taps (first cone tap anteriorly, second crossover tap), multiple reps B - 2 x 10 reps standing marching, cueing for slowed and controlled movement.   On level ground: forward and backward tandem walking down and back 3 reps with little to no UE support  On blue foam balance beam:  -forward tandem walking w/ intermittent UE support, down and back 3 reps -side  stepping with no UE support, visual and verbal cueing for posture, down and back 3 reps  On rockerboard: -1 x 20 reps A/P weight shifting onto heels and toes, with manual facilitation from therapist for hip strategy, pt with a couple of LOB, able to catch self using // bars.                          PT Long Term Goals - 01/09/19 1615      PT LONG TERM GOAL #1   Title  Pt will be I and compliant with HEP. (target for all LTG 6 weeks 02/20/19)    Status  New      PT LONG TERM GOAL #2   Title  Pt will improve FGA score to at least 25/30 to show improved balance.    Baseline  21    Status  New      PT LONG  TERM GOAL #3   Title  Pt will improve overall hip strength to 4+/5 bilat, knee strength to 5/5 bilat, and Rt ankle strength to 4+/5 to improve functional strength.    Status  New      PT LONG TERM GOAL #4   Title  Pt will improve Rt side neglect/inattention with gait and be about to avoid running into objects and improve scanning his enviornment.    Status  New           02/02/19 1444  Plan  Clinical Impression Statement Focus of today's skilled session was balance strategies on compliant surfaces, narrow BOS, eyes closed. Pt does best with visual/demonstrative and single command cues to perform exercises correctly. Cues needed to slow movement down throughout session to focus on eccentric control as well as added balance challenge. Will continue to progress towards LTGs.  Personal Factors and Comorbidities Comorbidity 3+  Comorbidities history of HTN, AAA, ASCVD, CVA 3 years ago and on 07/05/2018, with diagnosis of left temporal AVM was admitted to [facility] on 07/05/2018.  Examination-Activity Limitations Carry;Squat;Stairs;Lift;Locomotion Level  Examination-Participation Restrictions Community Activity;Driving;Yard Work;Shop;Meal Prep;Cleaning  Pt will benefit from skilled therapeutic intervention in order to improve on the following deficits Abnormal gait;Decreased activity tolerance;Decreased balance;Decreased coordination;Decreased endurance;Decreased range of motion;Decreased strength;Difficulty walking;Impaired flexibility;Pain  Stability/Clinical Decision Making Evolving/Moderate complexity  Rehab Potential Good  PT Frequency Other (comment) (1-2)  PT Duration 6 weeks  PT Treatment/Interventions ADLs/Self Care Home Management;Electrical Stimulation;Cryotherapy;Moist Heat;Gait training;Therapeutic activities;Therapeutic exercise;Balance training;Neuromuscular re-education;Manual techniques;Orthotic Fit/Training;Taping  PT Next Visit Plan does best with single step  commands/visual cues. needs high level balance challenges, dynamic gait, postural activities, general Rt sided strength and endurance.  PT Home Exercise Plan Access Code: [email or web address]  Consulted and Agree with Plan of Care Patient        Patient will benefit from skilled therapeutic intervention in order to improve the following deficits and impairments:     Visit Diagnosis: Other lack of coordination Muscle weakness (generalized) Other abnormalities of gait and mobility   Problem List Patient Active Problem List   Diagnosis Date Noted  . 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 S/P percutaneous endoscopic gastrostomy (PEG) tube placement ([place])   . Left temporal lobe hemorrhage ([place]) 08/21/2018  . CKD (chronic kidney disease), stage III   . Acute blood loss anemia   . Diabetes mellitus type 2 in nonobese (HCC)   . Essential hypertension   . Wernicke's fluent aphasia   . Acute on chronic respiratory  failure with hypoxia (HCC)   . Chronic kidney disease, stage III (moderate)   . Nontraumatic intracranial hemorrhage, unspecified (HCC)   . Altered mental status, unspecified   . S/P AAA repair 02/01/2018    Drake Leach, PT, DPT  02/02/2019, 2:23 PM  [facility] [address] [place], [place], [address] Phone: [phone]   Fax:  [phone]  Name: Rimas Gilham MRN: [id number] Date of Birth: [birth year]

## 2019-02-05 ENCOUNTER — Encounter: Payer: Medicare HMO | Admitting: Occupational Therapy

## 2019-02-05 ENCOUNTER — Ambulatory Visit: Payer: Medicare HMO | Admitting: Physical Therapy

## 2019-02-05 DIAGNOSIS — Z884 Allergy status to anesthetic agent status: Secondary | ICD-10-CM | POA: Diagnosis not present

## 2019-02-05 DIAGNOSIS — Z888 Allergy status to other drugs, medicaments and biological substances status: Secondary | ICD-10-CM | POA: Diagnosis not present

## 2019-02-05 DIAGNOSIS — D696 Thrombocytopenia, unspecified: Secondary | ICD-10-CM | POA: Diagnosis not present

## 2019-02-05 DIAGNOSIS — I10 Essential (primary) hypertension: Secondary | ICD-10-CM | POA: Diagnosis not present

## 2019-02-05 DIAGNOSIS — I6521 Occlusion and stenosis of right carotid artery: Secondary | ICD-10-CM | POA: Diagnosis not present

## 2019-02-05 DIAGNOSIS — I429 Cardiomyopathy, unspecified: Secondary | ICD-10-CM | POA: Diagnosis not present

## 2019-02-05 DIAGNOSIS — Z881 Allergy status to other antibiotic agents status: Secondary | ICD-10-CM | POA: Diagnosis not present

## 2019-02-05 DIAGNOSIS — Q283 Other malformations of cerebral vessels: Secondary | ICD-10-CM | POA: Diagnosis not present

## 2019-02-05 DIAGNOSIS — I69220 Aphasia following other nontraumatic intracranial hemorrhage: Secondary | ICD-10-CM | POA: Diagnosis not present

## 2019-02-05 DIAGNOSIS — Q282 Arteriovenous malformation of cerebral vessels: Secondary | ICD-10-CM | POA: Diagnosis not present

## 2019-02-05 DIAGNOSIS — Z87891 Personal history of nicotine dependence: Secondary | ICD-10-CM | POA: Diagnosis not present

## 2019-02-06 ENCOUNTER — Ambulatory Visit: Payer: Medicare HMO

## 2019-02-06 ENCOUNTER — Ambulatory Visit: Payer: Medicare HMO | Admitting: Occupational Therapy

## 2019-02-06 ENCOUNTER — Ambulatory Visit: Payer: Medicare HMO | Admitting: Physical Therapy

## 2019-02-10 ENCOUNTER — Ambulatory Visit: Payer: Medicare HMO

## 2019-02-10 ENCOUNTER — Other Ambulatory Visit: Payer: Self-pay

## 2019-02-10 ENCOUNTER — Ambulatory Visit: Payer: Medicare HMO | Admitting: Physical Therapy

## 2019-02-10 ENCOUNTER — Encounter: Payer: Self-pay | Admitting: Occupational Therapy

## 2019-02-10 ENCOUNTER — Ambulatory Visit: Payer: Medicare HMO | Admitting: Occupational Therapy

## 2019-02-10 DIAGNOSIS — I69151 Hemiplegia and hemiparesis following nontraumatic intracerebral hemorrhage affecting right dominant side: Secondary | ICD-10-CM | POA: Diagnosis not present

## 2019-02-10 DIAGNOSIS — R278 Other lack of coordination: Secondary | ICD-10-CM

## 2019-02-10 DIAGNOSIS — I69115 Cognitive social or emotional deficit following nontraumatic intracerebral hemorrhage: Secondary | ICD-10-CM | POA: Diagnosis not present

## 2019-02-10 DIAGNOSIS — R4701 Aphasia: Secondary | ICD-10-CM | POA: Diagnosis not present

## 2019-02-10 DIAGNOSIS — M6281 Muscle weakness (generalized): Secondary | ICD-10-CM

## 2019-02-10 DIAGNOSIS — R2689 Other abnormalities of gait and mobility: Secondary | ICD-10-CM

## 2019-02-10 DIAGNOSIS — M25611 Stiffness of right shoulder, not elsewhere classified: Secondary | ICD-10-CM | POA: Diagnosis not present

## 2019-02-10 DIAGNOSIS — R41842 Visuospatial deficit: Secondary | ICD-10-CM

## 2019-02-10 DIAGNOSIS — R41841 Cognitive communication deficit: Secondary | ICD-10-CM | POA: Diagnosis not present

## 2019-02-10 NOTE — Therapy (Signed)
[place] [address] [place], [place], [address] Phone: [phone]   Fax:  [phone]  Occupational Therapy Treatment  Patient Details  Name: Mark [name] MRN: [id number] Date of Birth: 16-Jun-1950 No data recorded  Encounter Date: 02/10/2019  OT End of Session - 02/10/19 1810    Visit Number  3    Number of Visits  17    Date for OT Re-Evaluation  03/20/19    Authorization Type  [employer] Medicare - need 10th visit PN    Authorization - Visit Number  3    Authorization - Number of Visits  10    OT Start Time  1703    OT Stop Time  1742    OT Time Calculation (min)  39 min    Activity Tolerance  Patient tolerated treatment well    Behavior During Therapy  [facility] for tasks assessed/performed       Past Medical History:  Diagnosis Date  . Acute on chronic respiratory failure with hypoxia ([place])   . Altered mental status, unspecified   . Chronic kidney disease    CKD stage 3  . Chronic kidney disease, stage III (moderate)   . Concussion    multiple in high school  . Coronary artery disease    pt denies  . Diabetes mellitus without complication (HCC)    borderline  . Hypertension   . Nontraumatic intracranial hemorrhage, unspecified ([place])   . PVD (peripheral vascular disease) (HCC)    leg stents femoral artery  . Stroke [facility])    3 years ago- no residual    Past Surgical History:  Procedure Laterality Date  . ABDOMINAL AORTIC ENDOVASCULAR STENT GRAFT N/A 02/01/2018   Procedure: ABDOMINAL AORTIC ENDOVASCULAR STENT GRAFT;  Surgeon: Serafina Mitchell, MD;  Location: [place];  Service: Vascular;  Laterality: N/A;  . NECK SURGERY    . VEIN BYPASS SURGERY      There were no vitals filed for this visit.  Subjective Assessment - 02/10/19 1710    Subjective   This was a little tight after surgery - Patient had angiogram recently - limit lifting to no more than 10 lbs                   OT  Treatments/Exercises (OP) - 02/10/19 0001      ADLs   Cooking  Patient without family member here today.  Patient indicates he has made toast at home, unsure of this is accurate.  Patient able to safely fry an egg in unfamiliar kitchen with only supervision assistance.        Visual/Perceptual Exercises   Other Exercises  Worked on visual scanning tabletop exercises.  Patient did well without any need for prompting to scan to right.  Need to assess environmental scanning skills.        Neurological Re-education Exercises   Other Exercises 1  Patient came today with MD order to return to therapy after angigram - patient with a 10 lb lifting restriction until 12/16.  Worked on in Recruitment consultant, and fine motor coordination.               OT Education - 02/10/19 1809    Education Details  lifting restriction    Person(s) Educated  Patient    Methods  Explanation    Comprehension  Need further instruction       OT Short Term Goals - 02/10/19 1719  OT SHORT TERM GOAL #1   Title  Patient will complete a HEP designed to improve range of motion in RUE    Status  On-going      OT SHORT TERM GOAL #2   Title  Patient will demonstrate safe use of stove top to prepare a warm meal with supervision    Status  Achieved      OT SHORT TERM GOAL #3   Title  Patient will bathe and dress himself appropriately for the weather with no more than minimal prompting    Status  Achieved      OT SHORT TERM GOAL #4   Title  Patient will navigate through minimally crowded hallway avoiding obstacles with minimal prompting    Status  Achieved        OT Long Term Goals - 01/09/19 1453      OT LONG TERM GOAL #1   Title  Patient will complete an updated HEP to address right UE strength    Time  8    Period  Weeks    Status  New    Target Date  03/20/19      OT LONG TERM GOAL #2   Title  Patient will be able to be left alone for 8+ hours during daytime, and manage his own needs.     Time  8    Period  Weeks    Status  New      OT LONG TERM GOAL #3   Title  Patient will prepare a warm familiar  meal consisting of at least two items independently    Time  8    Period  Weeks    Status  New      OT LONG TERM GOAL #4   Title  Patient will complete simple grocery shopping and locate and retrieve 5-6 familiar items with min cueing.  Goal does not include driving to store    Time  8    Period  Weeks    Status  New      OT LONG TERM GOAL #5   Title  Patient/caregivers will understand recommendations relating to driving    Time  8    Period  Weeks    Status  New            Plan - 02/10/19 1810    Clinical Impression Statement  Patient continues to progress in his ability to perform automatic functions.  He is affected by his aphasia and visual field cut, although is learning compensatory strategies for both.    OT Frequency  2x / week    OT Duration  8 weeks    OT Treatment/Interventions  Self-care/ADL training;Therapeutic exercise;Visual/perceptual remediation/compensation;Patient/family education;Neuromuscular education;Aquatic Therapy;Therapeutic activities;Balance training;Cognitive remediation/compensation;Manual Therapy;DME and/or AE instruction    Plan  environmental scanning, determine if lifting restriction ahs been lifted - RUE 10 lbs, assess progress toward long term goals       Patient will benefit from skilled therapeutic intervention in order to improve the following deficits and impairments:           Visit Diagnosis: Visuospatial deficit  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 Right-sided visual  neglect 10/31/2018  . S/P percutaneous endoscopic gastrostomy (PEG) tube placement (HCC)   . 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 CKD (chronic kidney disease), stage III   . Acute blood loss anemia   . Diabetes mellitus type 2 in nonobese (HCC)   . Essential hypertension   . Wernicke's fluent aphasia   . Acute on chronic respiratory failure with hypoxia (HCC)   . Chronic kidney disease, stage III (moderate)   . Nontraumatic intracranial hemorrhage, unspecified (HCC)   . Altered mental status, unspecified   . S/P AAA repair 02/01/2018    Collier Salina, OTR/L 02/10/2019, 6:13 PM  [facility] [address] [place], [place], [address] Phone: [phone]   Fax:  [phone]  Name: Mark [name] MRN: [id number] Date of Birth: [birth year]

## 2019-02-10 NOTE — Therapy (Signed)
[facility] [address] [place], [place], [address] Phone: [phone]   Fax:  [phone]  Physical Therapy Treatment  Patient Details  Name: Mark [name] MRN: [id number] Date of Birth: 04-02-1950 No data recorded  Encounter Date: 02/10/2019  PT End of Session - 02/10/19 2126    Visit Number  3    Number of Visits  8    Date for PT Re-Evaluation  02/20/19    Authorization Type  Aetna MCR    PT Start Time  1615    PT Stop Time  1700    PT Time Calculation (min)  45 min    Equipment Utilized During Treatment  Gait belt    Activity Tolerance  Patient tolerated treatment well    Behavior During Therapy  [facility] for tasks assessed/performed       Past Medical History:  Diagnosis Date  . Acute on chronic respiratory failure with hypoxia ([place])   . Altered mental status, unspecified   . Chronic kidney disease    CKD stage 3  . Chronic kidney disease, stage III (moderate)   . Concussion    multiple in high school  . Coronary artery disease    pt denies  . Diabetes mellitus without complication (HCC)    borderline  . Hypertension   . Nontraumatic intracranial hemorrhage, unspecified ([place])   . PVD (peripheral vascular disease) (HCC)    leg stents femoral artery  . Stroke [facility])    3 years ago- no residual    Past Surgical History:  Procedure Laterality Date  . ABDOMINAL AORTIC ENDOVASCULAR STENT GRAFT N/A 02/01/2018   Procedure: ABDOMINAL AORTIC ENDOVASCULAR STENT GRAFT;  Surgeon: Serafina Mitchell, MD;  Location: [facility];  Service: Vascular;  Laterality: N/A;  . NECK SURGERY    . VEIN BYPASS SURGERY      There were no vitals filed for this visit.  Subjective Assessment - 02/10/19 1623    Subjective  Pt cleared to return to therapy after angiogram but restricted lifting >10lb until Wednesday.  No changes, no falls.    Patient is accompained by:  --   daughter Scientist, water quality   Pertinent History  history of HTN,  AAA, ASCVD, CVA 3 years ago and on 07/05/2018,difficulty weaning from vent    How long can you stand comfortably?  unsure    How long can you walk comfortably?  unsure    Patient Stated Goals  daugher wants to improve strength, balance, endurance, cognition    Currently in Pain?  No/denies                       [facility] Adult PT Treatment/Exercise - 02/10/19 1636      Ambulation/Gait   Ambulation/Gait  Yes    Ambulation/Gait Assistance  5: Supervision    Ambulation/Gait Assistance Details  negotiating forwards and then laterally over obstacles of varying heights first without vision obstructed and then with vision obstructed holding 8lb box in both hands.  Pt able to clear to the L but required increased time to locate and sequence stepping over to the R.  At one obstacle pt consistently ran into object on R with box despite ongoing cues.  Also performed walking forwards, backwards and R/L laterally across red mat, solid floor and then blue mat while performing taps to cone to R, to L and then forwards with each foot for SLS balance, weight shifting and attention to R.  Performed  with supervision-min A    Ambulation Distance (Feet)  300 Feet    Assistive device  None      High Level Balance   High Level Balance Activities  Other (comment)    High Level Balance Comments  Performed side stepping down hallway while sequencing ball bouncing against wall and then floor <> wall to focus on 2 step commands, attention to R and sequencing. Pt became very frustrated during this activity and would frequently drop the ball and then impulsively throw the ball at the wall.  Attempted to calm and redirect pt but pt continued to become frustrated with activity.  Ceased activity and discussed patient's frustration - pt felt like activities today were a waste of time and childish.  He stated, "these aren't real!"  Also stated, "I just want to get through this to be done with all this!" Returned to mat and  attempted to discuss with patient his current functional limitations at home in order to determine what would be meaningful to the patient.  Therapist attempted to clarify patient's statements.  Pt appeared to be saying that at home (or in the community) he has to do a lot of step ups and step downs - will likely need to clarify at next visit.      Therapeutic Activites    Therapeutic Activities  Lifting    Lifting  Educated pt that physician did clear him to return to therapy after angiogram but restricted his lifting to <10 lb until this Wednesday.  Pt was unaware and stated he had been lifting heavier items at home.             PT Education - 02/10/19 2125    Education Details  lifting restrictions after angiogram    Person(s) Educated  Patient    Methods  Explanation    Comprehension  Verbalized understanding          PT Long Term Goals - 01/09/19 1615      PT LONG TERM GOAL #1   Title  Pt will be I and compliant with HEP. (target for all LTG 6 weeks 02/20/19)    Status  New      PT LONG TERM GOAL #2   Title  Pt will improve FGA score to at least 25/30 to show improved balance.    Baseline  21    Status  New      PT LONG TERM GOAL #3   Title  Pt will improve overall hip strength to 4+/5 bilat, knee strength to 5/5 bilat, and Rt ankle strength to 4+/5 to improve functional strength.    Status  New      PT LONG TERM GOAL #4   Title  Pt will improve Rt side neglect/inattention with gait and be about to avoid running into objects and improve scanning his enviornment.    Status  New            Plan - 02/10/19 1705    Clinical Impression Statement  Continued to focus on dynamic gait and balance training on solid and compliant surfaces with obstacle negotiation while carrying an object in both hands and cone taps to focus on safety in environment, attention to R, following 2 step commands and sequencing.  Pt participated in these activities but would become easily  frustrated or would state, "waste of time."  Continued balance, coordination and attention training with ball bounces in hallway but pt became increasingly frustrated and impulsive - ceased activity  and attempted to calm pt and engage in conversation regarding his personal goals and functional limitations.  Pt appeared to be communicating that he has to go up/down steps a lot -may need to clarify with family at next session.  Will continue to finding meaningful/functional activities to engage patient and address ongoing impairments.    Personal Factors and Comorbidities  Comorbidity 3+    Comorbidities  history of HTN, AAA, ASCVD, CVA 3 years ago and on 07/05/2018, with diagnosis of left temporal AVM was admitted to [facility] on 07/05/2018.    Examination-Activity Limitations  Carry;Squat;Stairs;Lift;Locomotion Level    Examination-Participation Restrictions  Community Activity;Driving;Yard Work;Shop;Meal Prep;Cleaning    Stability/Clinical Decision Making  Evolving/Moderate complexity    Rehab Potential  Good    PT Frequency  Other (comment)   1-2   PT Duration  6 weeks    PT Treatment/Interventions  ADLs/Self Care Home Management;Electrical Stimulation;Cryotherapy;Moist Heat;Gait training;Therapeutic activities;Therapeutic exercise;Balance training;Neuromuscular re-education;Manual techniques;Orthotic Fit/Training;Taping    PT Next Visit Plan  step ups/downs, stairs, attention to R environment - try to make activities as functional as possible due to apraxia.  1 step commands.    PT Home Exercise Plan  Access Code: [email or web address]    Consulted and Agree with Plan of Care  Patient       Patient will benefit from skilled therapeutic intervention in order to improve the following deficits and impairments:  Abnormal gait, Decreased activity tolerance, Decreased balance, Decreased coordination, Decreased endurance, Decreased range of motion, Decreased strength, Difficulty walking, Impaired  flexibility, Pain  Visit Diagnosis: Other abnormalities of gait and mobility  Muscle weakness (generalized)  Other lack of coordination     Problem List Patient Active Problem List   Diagnosis Date Noted  . 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 S/P percutaneous endoscopic gastrostomy (PEG) tube placement (HCC)   . 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 CKD (chronic kidney disease), stage III   . Acute blood loss anemia   . Diabetes mellitus type 2 in nonobese (HCC)   . Essential hypertension   . Wernicke's fluent aphasia   . Acute on chronic respiratory failure with hypoxia (HCC)   . Chronic kidney disease, stage III (moderate)   . Nontraumatic intracranial hemorrhage, unspecified (HCC)   . Altered mental status, unspecified   . S/P AAA repair 02/01/2018    Dierdre Highman, PT, DPT 02/10/19    9:37 PM    [facility] [address] [place], [place], [address] Phone: [phone]   Fax:  [phone]  Name: Mark [name] MRN: [id number] Date of Birth: 04-07-1950

## 2019-02-12 ENCOUNTER — Other Ambulatory Visit: Payer: Self-pay | Admitting: Physical Medicine and Rehabilitation

## 2019-02-14 ENCOUNTER — Ambulatory Visit: Payer: Medicare HMO

## 2019-02-14 ENCOUNTER — Encounter: Payer: Self-pay | Admitting: Physical Therapy

## 2019-02-14 ENCOUNTER — Ambulatory Visit: Payer: Medicare HMO | Admitting: Physical Therapy

## 2019-02-14 ENCOUNTER — Other Ambulatory Visit: Payer: Self-pay

## 2019-02-14 DIAGNOSIS — M25611 Stiffness of right shoulder, not elsewhere classified: Secondary | ICD-10-CM | POA: Diagnosis not present

## 2019-02-14 DIAGNOSIS — R278 Other lack of coordination: Secondary | ICD-10-CM | POA: Diagnosis not present

## 2019-02-14 DIAGNOSIS — R41842 Visuospatial deficit: Secondary | ICD-10-CM | POA: Diagnosis not present

## 2019-02-14 DIAGNOSIS — I69115 Cognitive social or emotional deficit following nontraumatic intracerebral hemorrhage: Secondary | ICD-10-CM | POA: Diagnosis not present

## 2019-02-14 DIAGNOSIS — M6281 Muscle weakness (generalized): Secondary | ICD-10-CM | POA: Diagnosis not present

## 2019-02-14 DIAGNOSIS — I69151 Hemiplegia and hemiparesis following nontraumatic intracerebral hemorrhage affecting right dominant side: Secondary | ICD-10-CM | POA: Diagnosis not present

## 2019-02-14 DIAGNOSIS — R41841 Cognitive communication deficit: Secondary | ICD-10-CM | POA: Diagnosis not present

## 2019-02-14 DIAGNOSIS — R2689 Other abnormalities of gait and mobility: Secondary | ICD-10-CM

## 2019-02-14 DIAGNOSIS — R4701 Aphasia: Secondary | ICD-10-CM

## 2019-02-14 NOTE — Therapy (Signed)
[facility] [address] [place], [place], [address] Phone: [phone]   Fax:  [phone]  Speech Language Pathology Treatment  Patient Details  Name: Mark [name] MRN: [id number] Date of Birth: 1950-08-04 Referring Provider (SLP): Lorelee Cover, MD   Encounter Date: 02/14/2019  End of Session - 02/14/19 1624    Visit Number  5    Number of Visits  17    Date for SLP Re-Evaluation  04/10/19    SLP Start Time  1450    SLP Stop Time   1530    SLP Time Calculation (min)  40 min    Activity Tolerance  Patient tolerated treatment well       Past Medical History:  Diagnosis Date  . Acute on chronic respiratory failure with hypoxia (HCC)   . Altered mental status, unspecified   . Chronic kidney disease    CKD stage 3  . Chronic kidney disease, stage III (moderate)   . Concussion    multiple in high school  . Coronary artery disease    pt denies  . Diabetes mellitus without complication (HCC)    borderline  . Hypertension   . Nontraumatic intracranial hemorrhage, unspecified (HCC)   . PVD (peripheral vascular disease) (HCC)    leg stents femoral artery  . Stroke [facility])    3 years ago- no residual    Past Surgical History:  Procedure Laterality Date  . ABDOMINAL AORTIC ENDOVASCULAR STENT GRAFT N/A 02/01/2018   Procedure: ABDOMINAL AORTIC ENDOVASCULAR STENT GRAFT;  Surgeon: Nada Libman, MD;  [facility];  Service: Vascular;  Laterality: N/A;  . NECK SURGERY    . VEIN BYPASS SURGERY      There were no vitals filed for this visit.  Subjective Assessment - 02/14/19 1605    Subjective  Dtr reports pt extremely reluctant to try something he knows he will not do correctly.    Patient is accompained by:  --   daughter   Currently in Pain?  No/denies            ADULT SLP TREATMENT - 02/14/19 1606      General Information   Behavior/Cognition  Alert   mildly uncooperative      Treatment Provided   Treatment provided  Cognitive-Linquistic      Cognitive-Linquistic Treatment   Treatment focused on  Aphasia;Patient/family/caregiver education    Skilled Treatment  SLP told dtr after session today pt will have to begin to make attempts at verbalization that may be incorrect or ST will not be able to continue. Dtr states that pt will not do something at home he thinks he may not do correctly - gave two examples. Dtr reports she attempts functional and practical verbal tasks at home and pt is very often/always reluctant to attempt a response. High interest topic (naming teams in [employer] conference) pt with 2 responses and then attempted at using compensations but some were so abstract based upon what pt choice to say only what he could say correctly and his decr'd awareness of errors that they were ineffective.       Assessment / Recommendations / Plan   Plan  Continue with current plan of care      Progression Toward Goals   Progression toward goals  Not progressing toward goals (comment)   pt mostly unwilling to make errors with verbalization      SLP Education - 02/14/19 1624    Education Details  cont to try functional practical tasks with pt at home    Person(s) Educated  Child(ren)    Methods  Explanation;Demonstration    Comprehension  Verbalized understanding       SLP Short Term Goals - 02/14/19 1628      SLP SHORT TERM GOAL #1   Title  pt will correctly answer simple questions in context 80% of the time over 3 sessions    Time  2    Period  Weeks    Status  On-going      SLP SHORT TERM GOAL #2   Title  pt will respond to requests for participation in therapy tasks by SLP in a manner conducive to therapy progressin ST for 6 sessions    Time  2    Period  Weeks    Status  On-going      SLP SHORT TERM GOAL #3   Title  pt will ID errors in verbal expression tasks by verbal (e.g., self correction, verbal disgust) or non-verbal (d.g., frowning, etc)  responses at least 70% of the time in 3 sessions    Time  2    Period  Weeks    Status  On-going      SLP SHORT TERM GOAL #4   Title  Pt will ID at least 15 words/phrases to practice at home    Time  2    Period  Weeks    Status  On-going      SLP SHORT TERM GOAL #5   Title  pt will attempt multimodal communication in 5 therapy sessions    Time  2    Period  Weeks    Status  On-going       SLP Long Term Goals - 02/14/19 1628      SLP LONG TERM GOAL #1   Title  pt will show documentation of home practice between at least 12 sessions    Time  6    Period  Weeks   or 17 sessions, for all LTGs   Status  On-going      SLP LONG TERM GOAL #2   Title  pt will request repeats for misunderstood comments/questions by SLP or another speaker appropriately 80% of the time    Time  6    Period  Weeks    Status  On-going      SLP LONG TERM GOAL #3   Title  pt will demo ID of verbal errors by verbal or nonverbal means 85% of the time in 3 therapy sessions    Time  6    Period  Weeks    Status  On-going      SLP LONG TERM GOAL #4   Title  pt will attempt multimodal communication in 10 therapy sessions    Time  6    Period  Weeks    Status  On-going       Plan - 02/14/19 1625    Clinical Impression Statement  Pt cont to present today with severe Wernicke's aphasia. SLP used augmentation of auditory comp by written single words and use of gestures for pt comprehension, and written, phonemic, and semantic cues for verbal expression. SLP told daughter today pt would need to begin to make some attempts at more frequent verbalization or ST will be very limited in what can be done in therapy sessions. Pt could benefit more from skilled ST to improve communication effectiveness to be able to interact in the community,  and reduce caregiver burden with greater participation    Speech Therapy Frequency  2x / week    Duration  --   8 weeks or 17 total sessions   Treatment/Interventions   Internal/external aids;Patient/family education;Compensatory strategies;Cognitive reorganization;SLP instruction and feedback;Cueing hierarchy;Language facilitation;Multimodal communcation approach;Functional tasks    Potential to Achieve Goals  Fair    Potential Considerations  Severity of impairments;Cooperation/participation level    Consulted and Agree with Plan of Care  Patient;Family member/caregiver    Family Member Consulted  dtr, [place]       Patient will benefit from skilled therapeutic intervention in order to improve the following deficits and impairments:   Aphasia  Cognitive communication deficit    Problem List Patient Active Problem List   Diagnosis Date Noted  . 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 S/P percutaneous endoscopic gastrostomy (PEG) tube placement ([place])   . Left temporal lobe hemorrhage ([place]) 08/21/2018  . CKD (chronic kidney disease), stage III   . Acute blood loss anemia   . Diabetes mellitus type 2 in nonobese (HCC)   . Essential hypertension   . Wernicke's fluent aphasia   . Acute on chronic respiratory failure with hypoxia ([place])   . Chronic kidney disease, stage III (moderate)   . Nontraumatic intracranial hemorrhage, unspecified ([place])   . Altered mental status, unspecified   . S/P AAA repair 02/01/2018    [facility], CCC-SLP  02/14/2019, 4:29 PM  [facility] [address] [place], [place], [address] Phone: [phone]   Fax:  [phone]   Name: Evrett Hakim MRN: [id number] Date of Birth: [birth year]

## 2019-02-14 NOTE — Therapy (Signed)
[facility] [address] [place], [place], [address] Phone: [phone]   Fax:  [phone]  Physical Therapy Treatment  Patient Details  Name: Mark [name] MRN: [id number] Date of Birth: 08/06/1950 No data recorded  Encounter Date: 02/14/2019  PT End of Session - 02/14/19 1638    Visit Number  4    Number of Visits  8    Date for PT Re-Evaluation  02/20/19    Authorization Type  Aetna MCR    PT Start Time  4481    PT Stop Time  1616    PT Time Calculation (min)  41 min    Equipment Utilized During Treatment  Gait belt    Activity Tolerance  Patient tolerated treatment well    Behavior During Therapy  [facility] for tasks assessed/performed       Past Medical History:  Diagnosis Date  . Acute on chronic respiratory failure with hypoxia ([place])   . Altered mental status, unspecified   . Chronic kidney disease    CKD stage 3  . Chronic kidney disease, stage III (moderate)   . Concussion    multiple in high school  . Coronary artery disease    pt denies  . Diabetes mellitus without complication (HCC)    borderline  . Hypertension   . Nontraumatic intracranial hemorrhage, unspecified ([place])   . PVD (peripheral vascular disease) (HCC)    leg stents femoral artery  . Stroke [facility])    3 years ago- no residual    Past Surgical History:  Procedure Laterality Date  . ABDOMINAL AORTIC ENDOVASCULAR STENT GRAFT N/A 02/01/2018   Procedure: ABDOMINAL AORTIC ENDOVASCULAR STENT GRAFT;  Surgeon: Serafina Mitchell, MD;  Location: [facility];  Service: Vascular;  Laterality: N/A;  . NECK SURGERY    . VEIN BYPASS SURGERY      There were no vitals filed for this visit.  Subjective Assessment - 02/14/19 1538    Subjective  No falls. No changes.    Patient is accompained by:  --   daughter Scientist, water quality   Pertinent History  history of HTN, AAA, ASCVD, CVA 3 years ago and on 07/05/2018,difficulty weaning from vent    How long can you  stand comfortably?  unsure    How long can you walk comfortably?  unsure    Patient Stated Goals  daugher wants to improve strength, balance, endurance, cognition    Currently in Pain?  No/denies                       [facility] Adult PT Treatment/Exercise - 02/14/19 0001      Ambulation/Gait   Ambulation/Gait  Yes    Ambulation/Gait Assistance  5: Supervision    Ambulation/Gait Assistance Details  Ambulating around therapy gym scanning environment and looking for 8 cones placed by therapist to pt's R to practice scanning environment and attention to R. During 1st lap pt able to find 6 cones. Re-hid all 8 cones and pt needing 2 laps to find 8 cones with verbal cues to last 2 cones on where to scan. Needed cues to maintain gait speed throughout activity. Needed a couple episodes of min guard to prevent from pt bumping into objects on his R. Ambulating down hallway with numbered cards placed on right and left ambulating on and off of blue/red floor mats - asking pt to scan environment and look right and left to call out number's on cards. Pt with  slow gait speed and would frequently stop to find numbers. Down and back x3  reps.     Ambulation Distance (Feet)  400 Feet    Assistive device  None      Neuro Re-ed    Neuro Re-ed Details   On blue and red floor mats: reciprocal slow marching with alternating UE lift for SLS, core activation, and posture - down and back 4 reps. Needed frequent cues to look up for posture. Down and back x2 reps forward reciprocal stepping over 3 hurdles of different heights. Down and back x3 reps side stepping over hurdles with pt with increased difficulty side stepping with RLE while maintaining weight on LLE.       Exercises   Exercises  Knee/Hip      Knee/Hip Exercises: Standing   Lateral Step Up  1 set;10 reps;Both;Hand Hold: 0;Step Height: 6"    Lateral Step Up Limitations  initial visual demonstration, cues for posture    Forward Step Up  Hand Hold:  0;Step Height: 6";1 set;10 reps;Both    Forward Step Up Limitations  cues for posture                  PT Long Term Goals - 01/09/19 1615      PT LONG TERM GOAL #1   Title  Pt will be I and compliant with HEP. (target for all LTG 6 weeks 02/20/19)    Status  New      PT LONG TERM GOAL #2   Title  Pt will improve FGA score to at least 25/30 to show improved balance.    Baseline  21    Status  New      PT LONG TERM GOAL #3   Title  Pt will improve overall hip strength to 4+/5 bilat, knee strength to 5/5 bilat, and Rt ankle strength to 4+/5 to improve functional strength.    Status  New      PT LONG TERM GOAL #4   Title  Pt will improve Rt side neglect/inattention with gait and be about to avoid running into objects and improve scanning his enviornment.    Status  New            Plan - 02/14/19 1640    Clinical Impression Statement  Focus of today's skilled session was dynamic gait activities with focus on attention and scanning environment to R, balance on compliant surfaces, and LE strengthening. Pt willing to participate in all activities today except for step ups on BOSU with no UE support - stated "I do not want to do this". Pt needing visual cues and repetition of instructions for activities. Will continue to progress dynamic gait activities, LE strengthening and endurance in order to progress towards LTGs.    Personal Factors and Comorbidities  Comorbidity 3+    Comorbidities  history of HTN, AAA, ASCVD, CVA 3 years ago and on 07/05/2018, with diagnosis of left temporal AVM was admitted to [facility] on 07/05/2018.    Examination-Activity Limitations  Carry;Squat;Stairs;Lift;Locomotion Level    Examination-Participation Restrictions  Community Activity;Driving;Yard Work;Shop;Meal Prep;Cleaning    Stability/Clinical Decision Making  Evolving/Moderate complexity    Rehab Potential  Good    PT Frequency  Other (comment)   1-2   PT Duration  6 weeks     PT Treatment/Interventions  ADLs/Self Care Home Management;Electrical Stimulation;Cryotherapy;Moist Heat;Gait training;Therapeutic activities;Therapeutic exercise;Balance training;Neuromuscular re-education;Manual techniques;Orthotic Fit/Training;Taping    PT Next Visit Plan  step ups/downs, stairs, attention  to R environment - try to make activities as functional as possible due to apraxia.  1 step commands.    PT Home Exercise Plan  Access Code: [email or web address]    Consulted and Agree with Plan of Care  Patient       Patient will benefit from skilled therapeutic intervention in order to improve the following deficits and impairments:  Abnormal gait, Decreased activity tolerance, Decreased balance, Decreased coordination, Decreased endurance, Decreased range of motion, Decreased strength, Difficulty walking, Impaired flexibility, Pain  Visit Diagnosis: Muscle weakness (generalized)  Other abnormalities of gait and mobility  Hemiplegia and hemiparesis following nontraumatic intracerebral hemorrhage affecting right dominant side [facility])     Problem List Patient Active Problem List   Diagnosis Date Noted  . 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 S/P percutaneous endoscopic gastrostomy (PEG) tube placement (HCC)   . 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 CKD (chronic kidney disease), stage III   . Acute blood loss anemia   . Diabetes mellitus type 2 in nonobese (HCC)   . Essential hypertension   . Wernicke's fluent aphasia   . Acute on chronic respiratory failure with hypoxia (HCC)   . Chronic kidney disease, stage III (moderate)   . Nontraumatic intracranial hemorrhage, unspecified (HCC)   . Altered mental status, unspecified   . S/P AAA repair 02/01/2018    Drake Leach, PT, DPT  02/14/2019, 4:49 PM  [facility] [address] [place], [place], [address] Phone:  [phone]   Fax:  [phone]  Name: Mark [name] MRN: [id number] Date of Birth: August 04, 1950

## 2019-02-18 ENCOUNTER — Other Ambulatory Visit: Payer: Self-pay

## 2019-02-18 ENCOUNTER — Ambulatory Visit: Payer: Medicare HMO

## 2019-02-18 ENCOUNTER — Ambulatory Visit: Payer: Medicare HMO | Admitting: Occupational Therapy

## 2019-02-18 ENCOUNTER — Ambulatory Visit: Payer: Medicare HMO | Admitting: Physical Therapy

## 2019-02-18 DIAGNOSIS — I69151 Hemiplegia and hemiparesis following nontraumatic intracerebral hemorrhage affecting right dominant side: Secondary | ICD-10-CM

## 2019-02-18 DIAGNOSIS — R41841 Cognitive communication deficit: Secondary | ICD-10-CM

## 2019-02-18 DIAGNOSIS — R4701 Aphasia: Secondary | ICD-10-CM | POA: Diagnosis not present

## 2019-02-18 DIAGNOSIS — I69115 Cognitive social or emotional deficit following nontraumatic intracerebral hemorrhage: Secondary | ICD-10-CM

## 2019-02-18 DIAGNOSIS — R41842 Visuospatial deficit: Secondary | ICD-10-CM | POA: Diagnosis not present

## 2019-02-18 DIAGNOSIS — R278 Other lack of coordination: Secondary | ICD-10-CM

## 2019-02-18 DIAGNOSIS — R2689 Other abnormalities of gait and mobility: Secondary | ICD-10-CM

## 2019-02-18 DIAGNOSIS — M6281 Muscle weakness (generalized): Secondary | ICD-10-CM

## 2019-02-18 DIAGNOSIS — M25611 Stiffness of right shoulder, not elsewhere classified: Secondary | ICD-10-CM | POA: Diagnosis not present

## 2019-02-18 NOTE — Therapy (Signed)
[facility] [address] [place], [place], [address] Phone: [phone]   Fax:  [phone]  Speech Language Pathology Treatment  Patient Details  Name: De Jaworski MRN: [id number] Date of Birth: [birth year] Referring Provider (SLP): Murriel Hopper, MD   Encounter Date: 02/18/2019  End of Session - 02/18/19 1619    Visit Number  6    Number of Visits  17    Date for SLP Re-Evaluation  04/10/19    SLP Start Time  1448    SLP Stop Time   1530    SLP Time Calculation (min)  42 min    Activity Tolerance  Patient tolerated treatment well       Past Medical History:  Diagnosis Date  . Acute on chronic respiratory failure with hypoxia ([place])   . Altered mental status, unspecified   . Chronic kidney disease    CKD stage 3  . Chronic kidney disease, stage III (moderate)   . Concussion    multiple in high school  . Coronary artery disease    pt denies  . Diabetes mellitus without complication (HCC)    borderline  . Hypertension   . Nontraumatic intracranial hemorrhage, unspecified ([place])   . PVD (peripheral vascular disease) (HCC)    leg stents femoral artery  . Stroke [facility])    3 years ago- no residual    Past Surgical History:  Procedure Laterality Date  . ABDOMINAL AORTIC ENDOVASCULAR STENT GRAFT N/A 02/01/2018   Procedure: ABDOMINAL AORTIC ENDOVASCULAR STENT GRAFT;  Surgeon: Serafina Mitchell, MD;  Location: [facility];  Service: Vascular;  Laterality: N/A;  . NECK SURGERY    . VEIN BYPASS SURGERY      There were no vitals filed for this visit.  Subjective Assessment - 02/18/19 1455    Subjective  "[place] won."    Patient is accompained by:  --   dtr Thomes Dinning   Currently in Pain?  No/denies            ADULT SLP TREATMENT - 02/18/19 1457      General Information   Behavior/Cognition  Alert;Agitated;Uncooperative   mild-mod non-verbal agitation, mild verbal agitation     Treatment Provided    Treatment provided  Cognitive-Linquistic      Cognitive-Linquistic Treatment   Treatment focused on  Aphasia;Apraxia;Patient/family/caregiver education    Skilled Treatment  SLP attempted to work with pt's expressive communication today in divergent and responsive naming tasks with areas of high interest (sports, sports equipment). Phonemic, written, and semantic cues did not appear to assist pt. Pt demo'd  jargon and semantic paraphasias for the few responses he did attempt. Pt demonstrated decr'd auditory comprehension even for simple requests given multiple SLP revisions and repeats.       Assessment / Recommendations / Plan   Plan  Continue with current plan of care      Progression Toward Goals   Progression toward goals  Not progressing toward goals (comment)   decr'd cooperation, severity of deficits      SLP Education - 02/18/19 1619    Education Details  ideas for home tasks that may assist pt    Person(s) Educated  Child(ren);Patient    Methods  Explanation;Demonstration    Comprehension  Verbalized understanding       SLP Short Term Goals - 02/18/19 1656      SLP SHORT TERM GOAL #1   Title  pt will correctly answer simple questions in  context 80% of the time over 3 sessions    Time  1    Period  Weeks    Status  On-going      SLP SHORT TERM GOAL #2   Title  pt will respond to requests for participation in therapy tasks by SLP in a manner conducive to therapy progressin ST for 6 sessions    Time  1    Period  Weeks    Status  On-going      SLP SHORT TERM GOAL #3   Title  pt will ID errors in verbal expression tasks by verbal (e.g., self correction, verbal disgust) or non-verbal (d.g., frowning, etc) responses at least 70% of the time in 3 sessions    Time  1    Period  Weeks    Status  On-going      SLP SHORT TERM GOAL #4   Title  Pt will ID at least 15 words/phrases to practice at home    Time  1    Period  Weeks    Status  On-going      SLP SHORT TERM  GOAL #5   Title  pt will attempt multimodal communication in 5 therapy sessions    Time  1    Period  Weeks    Status  On-going       SLP Long Term Goals - 02/18/19 1657      SLP LONG TERM GOAL #1   Title  pt will show documentation of home practice between at least 12 sessions    Time  5    Period  Weeks   or 17 sessions, for all LTGs   Status  On-going      SLP LONG TERM GOAL #2   Title  pt will request repeats for misunderstood comments/questions by SLP or another speaker appropriately 80% of the time    Time  5    Period  Weeks    Status  On-going      SLP LONG TERM GOAL #3   Title  pt will demo ID of verbal errors by verbal or nonverbal means 85% of the time in 3 therapy sessions    Time  5    Period  Weeks    Status  On-going      SLP LONG TERM GOAL #4   Title  pt will attempt multimodal communication in 10 therapy sessions    Time  5    Period  Weeks    Status  On-going       Plan - 02/18/19 1620    Clinical Impression Statement  Severe Wernicke's aphasia continues. Prognosis for improvement is decr'd due to pt's personality (refuses making mistakes expressively), pt's frustration level, and severity of deficit. SLP cont'd using augmentation of auditory comp by written single words and use of gestures for pt comprehension, and written, phonemic, and semantic cues for verbal expression. SLP told daughter today pt would need to begin to make some attempts at more frequent verbalization or ST will be very limited in what can be done in therapy sessions. Pt could benefit from skilled ST to improve communication effectiveness to be able to interact in the community, and reduce caregiver burden with greater participation, however pt's motivation for therapy at this time appears reduced. SLP to attempt ST utilizing high interest topics/language for 3-4 more visits in hopes pt's participation improves. If not, pt will need to be put on hold or d/c'd.  Speech Therapy Frequency   2x / week    Duration  --   8 weeks or 17 total sessions   Treatment/Interventions  Internal/external aids;Patient/family education;Compensatory strategies;Cognitive reorganization;SLP instruction and feedback;Cueing hierarchy;Language facilitation;Multimodal communcation approach;Functional tasks    Potential to Achieve Goals  Fair    Potential Considerations  Severity of impairments;Cooperation/participation level    Consulted and Agree with Plan of Care  Patient;Family member/caregiver    Family Member Consulted  dtr, Bikey       Patient will benefit from skilled therapeutic intervention in order to improve the following deficits and impairments:   Aphasia  Cognitive communication deficit    Problem List Patient Active Problem List   Diagnosis Date Noted  . 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 S/P percutaneous endoscopic gastrostomy (PEG) tube placement (HCC)   . 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 CKD (chronic kidney disease), stage III   . Acute blood loss anemia   . Diabetes mellitus type 2 in nonobese (HCC)   . Essential hypertension   . Wernicke's fluent aphasia   . Acute on chronic respiratory failure with hypoxia (HCC)   . Chronic kidney disease, stage III (moderate)   . Nontraumatic intracranial hemorrhage, unspecified (HCC)   . Altered mental status, unspecified   . S/P AAA repair 02/01/2018    [facility] ,MS, CCC-SLP  02/18/2019, 4:57 PM  [facility] [address] [place], [place], [address] Phone: [phone]   Fax:  [phone]   Name: Archie Shea MRN: [id number] Date of Birth: 1950/04/19

## 2019-02-18 NOTE — Therapy (Signed)
[facility] [address] [place], [place], [address] Phone: [phone]   Fax:  [phone]  Physical Therapy Treatment/Re-Cert  Patient Details  Name: Mark [name] MRN: [id number] Date of Birth: 1950/07/02 No data recorded  Encounter Date: 02/18/2019  PT End of Session - 02/18/19 2048    Visit Number  5    Number of Visits  8    Date for PT Re-Evaluation  02/20/19    Authorization Type  [employer] MCR    PT Start Time  4081    PT Stop Time  1615    PT Time Calculation (min)  40 min    Equipment Utilized During Treatment  Gait belt    Activity Tolerance  Patient tolerated treatment well    Behavior During Therapy  [facility] for tasks assessed/performed       Past Medical History:  Diagnosis Date  . Acute on chronic respiratory failure with hypoxia ([place])   . Altered mental status, unspecified   . Chronic kidney disease    CKD stage 3  . Chronic kidney disease, stage III (moderate)   . Concussion    multiple in high school  . Coronary artery disease    pt denies  . Diabetes mellitus without complication (HCC)    borderline  . Hypertension   . Nontraumatic intracranial hemorrhage, unspecified ([place])   . PVD (peripheral vascular disease) (HCC)    leg stents femoral artery  . Stroke [facility])    3 years ago- no residual    Past Surgical History:  Procedure Laterality Date  . ABDOMINAL AORTIC ENDOVASCULAR STENT GRAFT N/A 02/01/2018   Procedure: ABDOMINAL AORTIC ENDOVASCULAR STENT GRAFT;  Surgeon: Serafina Mitchell, MD;  Location: [facility];  Service: Vascular;  Laterality: N/A;  . NECK SURGERY    . VEIN BYPASS SURGERY      There were no vitals filed for this visit.  Subjective Assessment - 02/18/19 1539    Subjective  No falls. No new complaints. does not really know if he is doing the exercises.    Patient is accompained by:  --   daughter Scientist, water quality   Pertinent History  history of HTN, AAA, ASCVD, CVA 3 years ago and  on 07/05/2018,difficulty weaning from vent    How long can you stand comfortably?  unsure    How long can you walk comfortably?  unsure    Patient Stated Goals  daugher wants to improve strength, balance, endurance, cognition    Currently in Pain?  No/denies         [facility] PT Assessment - 02/18/19 1540      Functional Gait  Assessment   Gait assessed   Yes    Gait Level Surface  Walks 20 ft in less than 5.5 sec, no assistive devices, good speed, no evidence for imbalance, normal gait pattern, deviates no more than 6 in outside of the 12 in walkway width.    Change in Gait Speed  Able to smoothly change walking speed without loss of balance or gait deviation. Deviate no more than 6 in outside of the 12 in walkway width.    Gait with Horizontal Head Turns  Performs head turns smoothly with slight change in gait velocity (eg, minor disruption to smooth gait path), deviates 6-10 in outside 12 in walkway width, or uses an assistive device.    Gait with Vertical Head Turns  Performs task with slight change in gait velocity (eg, minor disruption to smooth  gait path), deviates 6 - 10 in outside 12 in walkway width or uses assistive device    Gait and Pivot Turn  Pivot turns safely within 3 sec and stops quickly with no loss of balance.    Step Over Obstacle  Is able to step over 2 stacked shoe boxes taped together (9 in total height) without changing gait speed. No evidence of imbalance.    Gait with Narrow Base of Support  Is able to ambulate for 10 steps heel to toe with no staggering.    Gait with Eyes Closed  Walks 20 ft, slow speed, abnormal gait pattern, evidence for imbalance, deviates 10-15 in outside 12 in walkway width. Requires more than 9 sec to ambulate 20 ft.   16.78 seconds   Ambulating Backwards  Walks 20 ft, no assistive devices, good speed, no evidence for imbalance, normal gait    Steps  Alternating feet, no rail.    Total Score  26    FGA comment:  26/30                    [facility] Adult PT Treatment/Exercise - 02/18/19 0001      Ambulation/Gait   Ambulation/Gait  Yes    Ambulation/Gait Assistance  5: Supervision;4: Min guard    Ambulation/Gait Assistance Details  Pt with one instance of bumping into an object on his right today during therapy, bumped his hip into the countertop on the R.     Ambulation Distance (Feet)  400 Feet    Assistive device  None      Neuro Re-ed    Neuro Re-ed Details   Stepping stones and colorful foam bubbles placed under red and blue floor mats for unlevel/compliant surface: practiced gait over compliant surface with min guard x3 reps, progressing to adding head nods and head turns with pt demonstrating a much slower gait speed. Ambulated around gym tossing scarves between R and L UE for dual tasking, cues for increasing gait speed, progressing tossing scarf to pt's right towards therapist for improved awareness of R environment and scanning.           Balance Exercises - 02/18/19 1615      Balance Exercises: Standing   Standing Eyes Closed  Foam/compliant surface;3 reps;30 secs;Other (comment)   in corner, min guard for balance      Began reviewing LE strengthening exercises on pt's HEP, will review remainder at upcoming sessions:  Exercises   Supine Active Straight Leg Raise - 10 reps - 1-3 sets - 2x daily - 6x weekly   Clamshell with Resistance - 10 reps - 3 sets - 2x daily - 6x weekly - upgraded to green theraband   Heel Toe Raises with Counter Support - 10 reps - 2 sets - 2x daily - 6x weekly   Walking Tandem Stance - 2 reps - 2x daily - 6x weekly   Walking with Head Rotation - 2 reps - 2x daily - 6x weekly   Sidestepping in Squat with Resistance and Arms Forward - 10 reps - 1-2 sets - 2x daily - 6x weekly - side stepping with green theraband at countertop, needed initial demonstrative cues for performing and for mini squat   Sit to Stand without Arm Support - 10 reps - 1-2 sets - 2x  daily - 6x weekly -cues to slow down movement for eccentric control and to stand tall for posture.   Seated Ankle Dorsiflexion with Anchored Resistance - 10 reps - 3  sets - 2x daily - 6x weekly    PT Education - 02/18/19 2047    Education Details  importance of performing HEP    Person(s) Educated  Patient;Child(ren)   pt's daughter   Methods  Explanation    Comprehension  Need further instruction;Verbalized understanding          PT Long Term Goals - 02/18/19 2049      PT LONG TERM GOAL #1   Title  Pt will be I and compliant with HEP. (target for all LTG 6 weeks 02/20/19)    Baseline  pt's daughter reports pt has not been consistently performing HEP at home.    Status  Not Met      PT LONG TERM GOAL #2   Title  Pt will improve FGA score to at least 25/30 to show improved balance.    Baseline  26/30 on 02/18/19    Status  Achieved      PT LONG TERM GOAL #3   Title  Pt will improve overall hip strength to 4+/5 bilat, knee strength to 5/5 bilat, and Rt ankle strength to 4+/5 to improve functional strength.    Baseline  not formally assessed today.    Status  Deferred      PT LONG TERM GOAL #4   Title  Pt will improve Rt side neglect/inattention with gait and be about to avoid running into objects and improve scanning his enviornment.    Baseline  pt with one instance during session today of bumping into countertop on R during gait    Status  Not Met       Revised/ongoing LTGs for re-cert:  PT Long Term Goals - 02/18/19 2103      PT LONG TERM GOAL #1   Title  Pt will be I and compliant with HEP. ALL LTGS DUE 03/11/19    Baseline  pt's daughter reports pt has not been consistently performing HEP at home.    Time  3    Period  Weeks    Status  On-going    Target Date  03/11/19      PT LONG TERM GOAL #2   Title  Pt will improve FGA score to at least 28/30 to show improved balance.    Baseline  26/30 on 02/18/19    Status  Revised      PT LONG TERM GOAL #3   Title   Pt will improve overall hip strength to 4+/5 bilat, knee strength to 5/5 bilat, and Rt ankle strength to 4+/5 to improve functional strength.    Baseline  not formally assessed today.    Status  On-going      PT LONG TERM GOAL #4   Title  Pt will improve Rt side neglect/inattention with gait and be about to avoid running into objects and improve scanning his enviornment with no instances of bumping into objects during therapy session.    Baseline  pt with one instance during session today of bumping into countertop on R during gait    Status  On-going           Plan - 02/18/19 2102    Clinical Impression Statement  Focus of today's skilled session was assessing pt's LTGs for re-cert. Pt has met 1 out of 4 LTGs in regards to his FGA score - pt scored a 26/30 today, with pt having increased difficulty with gait with eyes closed and gait with head nods. Per pt and pt's daughter, pt  has not been consistently performing his HEP at home, and sometimes will refuse to do it. Began to review today LE strengthening and importance of performing at home. Pt continues to demonstrate R neglect and needs min guard occasionally to prevent patient from running into objects on his R. Pt willing to participate in all activities during PT today without refusing to perform any exercises. Will re-cert for an additional 1x week for 3 weeks to continue to focus on high level balance, attention to R environment, and LE strengthening. LTGs revised as appropriate.    Personal Factors and Comorbidities  Comorbidity 3+    Comorbidities  history of HTN, AAA, ASCVD, CVA 3 years ago and on 07/05/2018, with diagnosis of left temporal AVM was admitted to [facility] on 07/05/2018.    Examination-Activity Limitations  Carry;Squat;Stairs;Lift;Locomotion Level    Examination-Participation Restrictions  Community Activity;Driving;Yard Work;Shop;Meal Prep;Cleaning    Stability/Clinical Decision Making  Evolving/Moderate  complexity    Rehab Potential  Good    PT Frequency  1x / week    PT Duration  3 weeks    PT Treatment/Interventions  ADLs/Self Care Home Management;Electrical Stimulation;Cryotherapy;Moist Heat;Gait training;Therapeutic activities;Therapeutic exercise;Balance training;Neuromuscular re-education;Manual techniques;Orthotic Fit/Training;Taping    PT Next Visit Plan  step ups/downs, stairs, attention to R environment - try to make activities as functional as possible due to apraxia.  1 step commands. balance with eyes closed.    PT Home Exercise Plan  Access Code: [email or web address]    Consulted and Agree with Plan of Care  Patient       Patient will benefit from skilled therapeutic intervention in order to improve the following deficits and impairments:  Abnormal gait, Decreased activity tolerance, Decreased balance, Decreased coordination, Decreased endurance, Decreased range of motion, Decreased strength, Difficulty walking, Impaired flexibility, Pain  Visit Diagnosis: Other lack of coordination  Muscle weakness (generalized)  Other abnormalities of gait and mobility     Problem List Patient Active Problem List   Diagnosis Date Noted  . 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 S/P percutaneous endoscopic gastrostomy (PEG) tube placement ([place])   . 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 CKD (chronic kidney disease), stage III   . Acute blood loss anemia   . Diabetes mellitus type 2 in nonobese (H[facility])   . Essential hypertension   . Wernicke's fluent aphasia   . Acute on chronic respiratory failure with hypoxia ([place])   . Chronic kidney disease, stage III (moderate)   . Nontraumatic intracranial hemorrhage, unspecified ([place])   . Altered mental status, unspecified   . S/P AAA repair 02/01/2018    Arliss Journey, PT, DPT 02/18/2019, 9:03 PM  [facility] [address] [place], [place], [address] Phone: [phone]   Fax:  [phone]  Name: Mark [name] MRN: [id number] Date of Birth: 12/08/1950

## 2019-02-18 NOTE — Therapy (Signed)
[facility] [address] [place], [place], [address] Phone: [phone]   Fax:  [phone]  Occupational Therapy Treatment  Patient Details  Name: Mark [name] MRN: [id number] Date of Birth: Jun 06, 1950 No data recorded  Encounter Date: 02/18/2019  OT End of Session - 02/18/19 1844    Visit Number  4    Number of Visits  17    Date for OT Re-Evaluation  03/20/19    Authorization Type  [employer] Medicare - need 10th visit PN    Authorization - Visit Number  4    Authorization - Number of Visits  10    OT Start Time  1618    OT Stop Time  1700    OT Time Calculation (min)  42 min    Activity Tolerance  Patient tolerated treatment well    Behavior During Therapy  [facility] for tasks assessed/performed       Past Medical History:  Diagnosis Date  . Acute on chronic respiratory failure with hypoxia (HCC)   . Altered mental status, unspecified   . Chronic kidney disease    CKD stage 3  . Chronic kidney disease, stage III (moderate)   . Concussion    multiple in high school  . Coronary artery disease    pt denies  . Diabetes mellitus without complication (HCC)    borderline  . Hypertension   . Nontraumatic intracranial hemorrhage, unspecified (HCC)   . PVD (peripheral vascular disease) (HCC)    leg stents femoral artery  . Stroke [facility])    3 years ago- no residual    Past Surgical History:  Procedure Laterality Date  . ABDOMINAL AORTIC ENDOVASCULAR STENT GRAFT N/A 02/01/2018   Procedure: ABDOMINAL AORTIC ENDOVASCULAR STENT GRAFT;  Surgeon: Nada Libman, MD;  Location: [facility] OR;  Service: Vascular;  Laterality: N/A;  . NECK SURGERY    . VEIN BYPASS SURGERY      There were no vitals filed for this visit.      [facility] OT Assessment - 02/18/19 0001      Coordination   Right 9 Hole Peg Test  34.16      AROM   Right Shoulder Flexion  180 Degrees    Right Shoulder ABduction  180 Degrees                OT Treatments/Exercises (OP) - 02/18/19 0001      ADLs   Cooking  Daughter present for session today.  Shared that patient able to safely fry an egg last session.  Rveiewed goals relatied to cooking with daughter in patient's presence.  Discussed the benefit of assessing this in his environment.  Provide supervision initially and allow patient to cook a familiar meal for himself.  Daughter in agreement.    Driving  Discussed driving.  Driving is NOT recommended at this time.  Patient continues to have significant visual impairment - right visual field.  Recommend patient see neuro-opth. for testing, and perhaps for compensation.  Daughter will follow up.  Patient abe to state "not allowed to"  and pointed to right visual field - when asked about driving.  Daughter  in agreement that driving at this time would be unsafe.        Visual/Perceptual Exercises   Other Exercises  Worked on scanning environment - minimally distracting.  Patient with signifcant aphasia, yet able to recognize some simple symbols.  Used first 10 letters of alphabet, and had patient locate in  order.   Patient needed only cueing to slow down and put letters in alphabetical order - no difficulty visually locating.               OT Education - 02/18/19 1843    Education Details  recommend that patient not return to driving due to significance of visual field cut    Person(s) Educated  Patient;Child(ren)    Methods  Explanation    Comprehension  Verbalized understanding       OT Short Term Goals - 02/18/19 1846      OT SHORT TERM GOAL #1   Title  Patient will complete a HEP designed to improve range of motion in RUE    Status  Deferred   Patient has full range of motion in BUE's and WFL strength proxiamlly     OT SHORT TERM GOAL #2   Title  Patient will demonstrate safe use of stove top to prepare a warm meal with supervision    Status  Achieved      OT SHORT TERM GOAL #3   Title  Patient  will bathe and dress himself appropriately for the weather with no more than minimal prompting    Status  Achieved      OT SHORT TERM GOAL #4   Title  Patient will navigate through minimally crowded hallway avoiding obstacles with minimal prompting    Status  Achieved        OT Long Term Goals - 02/18/19 1847      OT LONG TERM GOAL #1   Title  Patient will complete an updated HEP to address right UE strength    Status  Achieved      OT LONG TERM GOAL #2   Title  Patient will be able to be left alone for 8+ hours during daytime, and manage his own needs.    Status  Achieved      OT LONG TERM GOAL #3   Title  Patient will prepare a warm familiar  meal consisting of at least two items independently    Status  On-going      OT LONG TERM GOAL #4   Title  Patient will complete simple grocery shopping and locate and retrieve 5-6 familiar items with min cueing.  Goal does not include driving to store    Status  On-going      OT LONG TERM GOAL #5   Title  Patient/caregivers will understand recommendations relating to driving    Status  Achieved            Plan - 02/18/19 1845    Clinical Impression Statement  Patient is quickly approaching OT goals, and anticipate discharge earlier than initially expected.  Daughter aware.    OT Frequency  2x / week    OT Duration  8 weeks    OT Treatment/Interventions  Self-care/ADL training;Therapeutic exercise;Visual/perceptual remediation/compensation;Patient/family education;Neuromuscular education;Aquatic Therapy;Therapeutic activities;Balance training;Cognitive remediation/compensation;Manual Therapy;DME and/or AE instruction    Plan  How did cooking go?  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 S/P percutaneous endoscopic gastrostomy (PEG) tube placement ([place])   . 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 CKD (chronic kidney disease), stage III   . Acute blood loss anemia   . Diabetes mellitus type 2 in nonobese (HCC)   . Essential hypertension   . Wernicke's fluent aphasia   . Acute on chronic respiratory failure with hypoxia ([place])   . Chronic kidney disease, stage III (moderate)   . Nontraumatic intracranial hemorrhage, unspecified ([place])   . Altered mental status, unspecified   . S/P AAA repair 02/01/2018    Mariah Milling, OTR/L 02/18/2019, 6:48 PM  [place] [address] [place], [place], [address] Phone: [phone]   Fax:  [phone]  Name: Mark [name] MRN: [id number] Date of Birth: Mar 13, 1950

## 2019-02-24 ENCOUNTER — Ambulatory Visit: Payer: Medicare HMO | Admitting: Occupational Therapy

## 2019-02-24 ENCOUNTER — Ambulatory Visit: Payer: Medicare HMO

## 2019-02-24 ENCOUNTER — Encounter: Payer: Self-pay | Admitting: Occupational Therapy

## 2019-02-24 ENCOUNTER — Other Ambulatory Visit: Payer: Self-pay

## 2019-02-24 DIAGNOSIS — R41842 Visuospatial deficit: Secondary | ICD-10-CM

## 2019-02-24 DIAGNOSIS — M6281 Muscle weakness (generalized): Secondary | ICD-10-CM

## 2019-02-24 DIAGNOSIS — R278 Other lack of coordination: Secondary | ICD-10-CM

## 2019-02-24 DIAGNOSIS — I69115 Cognitive social or emotional deficit following nontraumatic intracerebral hemorrhage: Secondary | ICD-10-CM

## 2019-02-24 DIAGNOSIS — I69151 Hemiplegia and hemiparesis following nontraumatic intracerebral hemorrhage affecting right dominant side: Secondary | ICD-10-CM

## 2019-02-24 DIAGNOSIS — R2689 Other abnormalities of gait and mobility: Secondary | ICD-10-CM

## 2019-02-24 DIAGNOSIS — R4701 Aphasia: Secondary | ICD-10-CM | POA: Diagnosis not present

## 2019-02-24 DIAGNOSIS — R41841 Cognitive communication deficit: Secondary | ICD-10-CM | POA: Diagnosis not present

## 2019-02-24 DIAGNOSIS — M25611 Stiffness of right shoulder, not elsewhere classified: Secondary | ICD-10-CM | POA: Diagnosis not present

## 2019-02-24 NOTE — Therapy (Signed)
[place] [address] [place], [place], [address] Phone: [phone]   Fax:  [phone]  Occupational Therapy Treatment  Patient Details  Name: Mark [name] MRN: [id number] Date of Birth: [birth year] No data recorded  Encounter Date: 02/24/2019  OT End of Session - 02/24/19 1648    Visit Number  5    Number of Visits  17    Date for OT Re-Evaluation  03/20/19    Authorization Type  [employer]na Medicare - need 10th visit PN    Authorization - Visit Number  5    Authorization - Number of Visits  10    OT Start Time  9509    OT Stop Time  1640    OT Time Calculation (min)  25 min    Activity Tolerance  Patient tolerated treatment well    Behavior During Therapy  [facility] for tasks assessed/performed       Past Medical History:  Diagnosis Date  . Acute on chronic respiratory failure with hypoxia ([place])   . Altered mental status, unspecified   . Chronic kidney disease    CKD stage 3  . Chronic kidney disease, stage III (moderate)   . Concussion    multiple in high school  . Coronary artery disease    pt denies  . Diabetes mellitus without complication (HCC)    borderline  . Hypertension   . Nontraumatic intracranial hemorrhage, unspecified ([place])   . PVD (peripheral vascular disease) (HCC)    leg stents femoral artery  . Stroke [facility])    3 years ago- no residual    Past Surgical History:  Procedure Laterality Date  . ABDOMINAL AORTIC ENDOVASCULAR STENT GRAFT N/A 02/01/2018   Procedure: ABDOMINAL AORTIC ENDOVASCULAR STENT GRAFT;  Surgeon: Serafina Mitchell, MD;  Location: [place];  Service: Vascular;  Laterality: N/A;  . NECK SURGERY    . VEIN BYPASS SURGERY      There were no vitals filed for this visit.  Subjective Assessment - 02/24/19 1618    Subjective   I still cant make it go - regarding hand fisting    Patient is accompanied by:  Family member    Currently in Pain?  No/denies    Multiple Pain Sites   No         [facility] OT Assessment - 02/24/19 0001      Hand Function   Right Hand Grip (lbs)  50               OT Treatments/Exercises (OP) - 02/24/19 0001      ADLs   Functional Mobility  Son took patient to the grocery store for the first time.  Patient able to locate all needed items, and even able to distinguish items he did not wish to purchase as they were not on sale.      Driving  Reviewed recommendation with son for no driving.  Son relieved, and indicated that patient had siad a few weeks ago - that he could if he had to.(drive) reinforced that patient not drive until cleared by MD from a visual perspective.        Modalities   Modalities  Fluidotherapy      RUE Fluidotherapy   Number Minutes Fluidotherapy  10 Minutes    RUE Fluidotherapy Location  Hand;Wrist    Comments  encouraged active flex/ext while in fluidotherapy to see if heat would help with joint stiffness.  Patient with full composite flexion in  right hand after heat with gentle mobility.               OT Education - 02/24/19 1647    Education Details  reviewed reamining goals and progress, and plan to discharge from OT    Person(s) Educated  Patient;Child(ren)    Methods  Explanation    Comprehension  Verbalized understanding       OT Short Term Goals - 02/24/19 1649      OT SHORT TERM GOAL #1   Title  Patient will complete a HEP designed to improve range of motion in RUE    Status  Deferred      OT SHORT TERM GOAL #2   Title  Patient will demonstrate safe use of stove top to prepare a warm meal with supervision    Status  Achieved      OT SHORT TERM GOAL #3   Title  Patient will bathe and dress himself appropriately for the weather with no more than minimal prompting    Status  Achieved      OT SHORT TERM GOAL #4   Title  Patient will navigate through minimally crowded hallway avoiding obstacles with minimal prompting    Status  Achieved        OT Long Term Goals - 02/24/19  1649      OT LONG TERM GOAL #1   Title  Patient will complete an updated HEP to address right UE strength    Status  Achieved      OT LONG TERM GOAL #2   Title  Patient will be able to be left alone for 8+ hours during daytime, and manage his own needs.    Status  Achieved      OT LONG TERM GOAL #3   Title  Patient will prepare a warm familiar  meal consisting of at least two items independently    Status  Achieved      OT LONG TERM GOAL #4   Title  Patient will complete simple grocery shopping and locate and retrieve 5-6 familiar items with min cueing.  Goal does not include driving to store    Status  Achieved      OT LONG TERM GOAL #5   Title  Patient/caregivers will understand recommendations relating to driving    Status  Achieved            Plan - 02/24/19 1648    Clinical Impression Statement  Patient has met remaining OT goals and is agreebale to discharge    OT Frequency  2x / week    OT Duration  8 weeks    OT Treatment/Interventions  Self-care/ADL training;Therapeutic exercise;Visual/perceptual remediation/compensation;Patient/family education;Neuromuscular education;Aquatic Therapy;Therapeutic activities;Balance training;Cognitive remediation/compensation;Manual Therapy;DME and/or AE instruction    Plan  discharge    OT Home Exercise Plan  has putty exercises from prior therapy    Consulted and Agree with Plan of Care  Patient;Family member/caregiver    Family Member Consulted  son       Patient will benefit from skilled therapeutic intervention in order to improve the following deficits and impairments:           Visit Diagnosis: Other lack of coordination  Muscle weakness (generalized)  Visuospatial deficit  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 S/P percutaneous endoscopic gastrostomy (PEG) tube placement ([place])   . 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 CKD (chronic kidney disease), stage III   . Acute blood loss anemia   . Diabetes mellitus type 2 in nonobese (HCC)   . Essential hypertension   . Wernicke's fluent aphasia   . Acute on chronic respiratory failure with hypoxia ([place])   . Chronic kidney disease, stage III (moderate)   . Nontraumatic intracranial hemorrhage, unspecified ([place])   . Altered mental status, unspecified   . S/P AAA repair 02/01/2018   OCCUPATIONAL THERAPY DISCHARGE SUMMARY  Visits from Start of Care: 5  Current functional level related to goals / functional outcomes: Independent with ADL/IADL   Remaining deficits: Aphasia, visual deficit(right)   Education / Equipment: HEP  Plan: Patient agrees to discharge.  Patient goals were met. Patient is being discharged due to meeting the stated rehab goals.  ?????      Mariah Milling , OTR/L 02/24/2019, 4:51 PM  [place] [address] [place], [place], [address] Phone: [phone]   Fax:  [phone]  Name: Mark [name] MRN: [id number] Date of Birth: 1951-02-19

## 2019-02-24 NOTE — Therapy (Signed)
[facility] [address] [place], [place], [address] Phone: [phone]   Fax:  [phone]  Physical Therapy Treatment  Patient Details  Name: Mark [name] MRN: [id number] Date of Birth: 06/09/1950 No data recorded  Encounter Date: 02/24/2019  PT End of Session - 02/24/19 1453    Visit Number  6    Number of Visits  8    Date for PT Re-Evaluation  03/20/19    Authorization Type  Aetna MCR    PT Start Time  1451    PT Stop Time  1530    PT Time Calculation (min)  39 min    Equipment Utilized During Treatment  Gait belt    Activity Tolerance  Patient tolerated treatment well    Behavior During Therapy  [facility] for tasks assessed/performed       Past Medical History:  Diagnosis Date  . Acute on chronic respiratory failure with hypoxia (HCC)   . Altered mental status, unspecified   . Chronic kidney disease    CKD stage 3  . Chronic kidney disease, stage III (moderate)   . Concussion    multiple in high school  . Coronary artery disease    pt denies  . Diabetes mellitus without complication (HCC)    borderline  . Hypertension   . Nontraumatic intracranial hemorrhage, unspecified (HCC)   . PVD (peripheral vascular disease) (HCC)    leg stents femoral artery  . Stroke [facility])    3 years ago- no residual    Past Surgical History:  Procedure Laterality Date  . ABDOMINAL AORTIC ENDOVASCULAR STENT GRAFT N/A 02/01/2018   Procedure: ABDOMINAL AORTIC ENDOVASCULAR STENT GRAFT;  Surgeon: Nada Libman, MD;  [facility];  Service: Vascular;  Laterality: N/A;  . NECK SURGERY    . VEIN BYPASS SURGERY      There were no vitals filed for this visit.  Subjective Assessment - 02/24/19 1454    Subjective  Pt accompanied by son. Reports he went to the grocery store for first time today.    Patient is accompained by:  --   daughter Pension scheme manager   Pertinent History  history of HTN, AAA, ASCVD, CVA 3 years ago and on  07/05/2018,difficulty weaning from vent    How long can you stand comfortably?  unsure    How long can you walk comfortably?  unsure    Patient Stated Goals  daugher wants to improve strength, balance, endurance, cognition    Currently in Pain?  No/denies                       [facility] Adult PT Treatment/Exercise - 02/24/19 1454      Ambulation/Gait   Ambulation/Gait  Yes    Ambulation/Gait Assistance  5: Supervision    Ambulation/Gait Assistance Details  Pt did not bump in to anything on right today during activities    Ambulation Distance (Feet)  500 Feet    Assistive device  None    Gait Comments  Dynamic gait activities performed during gait today: having patient look for 6 cones placed around circle in gym. Pt able to get 4 cones on right during 1st lap and then 2 more on left the second lap.      Neuro Re-ed    Neuro Re-ed Details   Dynamic gait activities: reciprocal steps over 3 hurdles of different height then weaving in and out of 6 cones x 2 laps, then  reciprocal steps over 3 hurdles and marching over 6 cones x 2 laps, then side stepping over 3 hurdles and 6 cones x 2 laps, then tapping cone prior to stepping over for increased SLS time x 2 laps. Standing on rockerboard positioned ant/post trying to maintain level x 30 sec eyes open and x 30 sec eyes closed with increased sway with eyes closed needing CGA for safety. Gait 115' playing catch with ball. Pt had 2 times where dropped ball stating vision makes it hard. PT provided supervision with activity while son tossed ball. Alternating toe taps on soccerball x 10 bilateral without UE support then SLS with other foot resting on soccerball x 30 sec each leg. Pt ambulated in hallway 30' x 2 with marching gait and then 30' x 2 with head turns left/right  supervision. Pt needed single step commands with activities and demonstration. Pt with expressive aphasia evident and did appear annoyed at some activities.              PT Education - 02/24/19 1541    Education Details  Pt to continue with current HEP    Person(s) Educated  Patient;Caregiver(s)    Methods  Explanation    Comprehension  Verbalized understanding          PT Long Term Goals - 02/18/19 2103      PT LONG TERM GOAL #1   Title  Pt will be I and compliant with HEP. ALL LTGS DUE 03/11/19    Baseline  pt's daughter reports pt has not been consistently performing HEP at home.    Time  3    Period  Weeks    Status  On-going    Target Date  03/11/19      PT LONG TERM GOAL #2   Title  Pt will improve FGA score to at least 28/30 to show improved balance.    Baseline  26/30 on 02/18/19    Status  Revised      PT LONG TERM GOAL #3   Title  Pt will improve overall hip strength to 4+/5 bilat, knee strength to 5/5 bilat, and Rt ankle strength to 4+/5 to improve functional strength.    Baseline  not formally assessed today.    Status  On-going      PT LONG TERM GOAL #4   Title  Pt will improve Rt side neglect/inattention with gait and be about to avoid running into objects and improve scanning his enviornment with no instances of bumping into objects during therapy session.    Baseline  pt with one instance during session today of bumping into countertop on R during gait    Status  On-going            Plan - 02/24/19 1542    Clinical Impression Statement  PT assessed ankle DF and hip abduction strength today and pt was 5/5. Pt did a better job of scanning today when cued to do so. Improved stability with gait with head turns. Pt was challenged with balance activity with eyes closed on compliant surface of rockerboard. PT attempted to assess vision and peripheral fields but difficult with patient's aphasia. Pt did state that right was blurrier than left.    Personal Factors and Comorbidities  Comorbidity 3+    Comorbidities  history of HTN, AAA, ASCVD, CVA 3 years ago and on 07/05/2018, with diagnosis of left temporal AVM was  admitted to [facility] on 07/05/2018.    Examination-Activity Limitations  Carry;Squat;Stairs;Lift;Locomotion Level    Examination-Participation Restrictions  Community Activity;Driving;Yard Work;Shop;Meal Prep;Cleaning    Stability/Clinical Decision Making  Evolving/Moderate complexity    Rehab Potential  Good    PT Frequency  1x / week    PT Duration  3 weeks    PT Treatment/Interventions  ADLs/Self Care Home Management;Electrical Stimulation;Cryotherapy;Moist Heat;Gait training;Therapeutic activities;Therapeutic exercise;Balance training;Neuromuscular re-education;Manual techniques;Orthotic Fit/Training;Taping    PT Next Visit Plan  step ups/downs, stairs, attention to R environment - try to make activities as functional as possible due to apraxia.  1 step commands. balance with eyes closed.    PT Home Exercise Plan  Access Code: [email or web address]    Consulted and Agree with Plan of Care  Patient       Patient will benefit from skilled therapeutic intervention in order to improve the following deficits and impairments:  Abnormal gait, Decreased activity tolerance, Decreased balance, Decreased coordination, Decreased endurance, Decreased range of motion, Decreased strength, Difficulty walking, Impaired flexibility, Pain  Visit Diagnosis: Muscle weakness (generalized)  Other abnormalities of gait and mobility     Problem List Patient Active Problem List   Diagnosis Date Noted  . 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 S/P percutaneous endoscopic gastrostomy (PEG) tube placement ([place])   . 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 CKD (chronic kidney disease), stage III   . Acute blood loss anemia   . Diabetes mellitus type 2 in nonobese (HCC)   . Essential hypertension   . Wernicke's fluent aphasia   . Acute on chronic respiratory failure with hypoxia ([place])   . Chronic kidney disease, stage III (moderate)   . Nontraumatic  intracranial hemorrhage, unspecified ([place])   . Altered mental status, unspecified   . S/P AAA repair 02/01/2018    Electa Sniff, PT, DPT, NCS 02/24/2019, 3:46 PM  [place] [address] [place], [place], [address] Phone: [phone]   Fax:  [phone]  Name: Gatlyn Lipari MRN: [id number] Date of Birth: 1950-09-25

## 2019-02-24 NOTE — Therapy (Signed)
[facility] [address] [place], [place], [address] Phone: [phone]   Fax:  [phone]  Speech Language Pathology Treatment  Patient Details  Name: Mark [name] MRN: [id number] Date of Birth: 10/17/1950 Referring Provider (SLP): Murriel Hopper, MD   Encounter Date: 02/24/2019  End of Session - 02/24/19 1620    Visit Number  7    Number of Visits  17    Date for SLP Re-Evaluation  04/10/19    SLP Start Time  1532    SLP Stop Time   1611    SLP Time Calculation (min)  39 min    Activity Tolerance  Patient tolerated treatment well       Past Medical History:  Diagnosis Date  . Acute on chronic respiratory failure with hypoxia ([facility])   . Altered mental status, unspecified   . Chronic kidney disease    CKD stage 3  . Chronic kidney disease, stage III (moderate)   . Concussion    multiple in high school  . Coronary artery disease    pt denies  . Diabetes mellitus without complication (HCC)    borderline  . Hypertension   . Nontraumatic intracranial hemorrhage, unspecified ([facility])   . PVD (peripheral vascular disease) (HCC)    leg stents femoral artery  . Stroke [facility])    3 years ago- no residual    Past Surgical History:  Procedure Laterality Date  . ABDOMINAL AORTIC ENDOVASCULAR STENT GRAFT N/A 02/01/2018   Procedure: ABDOMINAL AORTIC ENDOVASCULAR STENT GRAFT;  Surgeon: Serafina Mitchell, MD;  Location: [facility];  Service: Vascular;  Laterality: N/A;  . NECK SURGERY    . VEIN BYPASS SURGERY      There were no vitals filed for this visit.  Subjective Assessment - 02/24/19 1532    Subjective  "watch 'em play."    Patient is accompained by:  Family member            ADULT SLP TREATMENT - 02/24/19 1611      General Information   Behavior/Cognition  Alert;Agitated;Uncooperative      Treatment Provided   Treatment provided  Cognitive-Linquistic      Cognitive-Linquistic Treatment    Treatment focused on  Apraxia;Aphasia;Patient/family/caregiver education    Skilled Treatment  Pt son stated that the white board assisted pt somewhat at his home during Christmas holiday. SLP reassured son that white board will be helpful especially when linguistic context known by pt, so he/family may need to repeat and/or rephrase new topic multiple times for pt to comprehend the topic switch and language associated with new topic. Pt did not willingly attempt a response to any of SLP phonemic or semantic cues in high interest and salient items SLP attempted to have pt try (family names and sports). Instead, pt just reiterated that he knew what SLP was talking about "I've known the damn people for 50 years." (re: [employer] football). Pt with no awareness of errors today - e.g., "sister." for daughter.       Assessment / Recommendations / Plan   Plan  Continue with current plan of care   LTGs downgraded     Progression Toward Goals   Progression toward goals  Not progressing toward goals (comment)   decr'd cooperation, severity of deficits      SLP Education - 02/24/19 1620    Education Details  white board will assist pt when context known, may need to repeat/rephrase auditory material in a  new conversational topic    Person(s) Educated  Patient;Child(ren)    Methods  Explanation;Demonstration    Comprehension  Verbalized understanding       SLP Short Term Goals - 02/24/19 1623      SLP SHORT TERM GOAL #1   Title  pt will correctly answer simple questions in context 80% of the time over 3 sessions    Time  1    Period  Weeks    Status  Not Met      SLP SHORT TERM GOAL #2   Title  pt will respond to requests for participation in therapy tasks by SLP in a manner conducive to therapy progressin ST for 6 sessions    Time  1    Period  Weeks    Status  Not Met      SLP SHORT TERM GOAL #3   Title  pt will ID errors in verbal expression tasks by verbal (e.g., self correction, verbal  disgust) or non-verbal (d.g., frowning, etc) responses at least 70% of the time in 3 sessions    Time  1    Period  Weeks    Status  Not Met      SLP SHORT TERM GOAL #4   Title  Pt will ID at least 15 words/phrases to practice at home    Time  1    Period  Weeks    Status  Not Met      SLP SHORT TERM GOAL #5   Title  pt will attempt multimodal communication in 5 therapy sessions    Time  1    Period  Weeks    Status  Not Met       SLP Long Term Goals - 02/24/19 1623      SLP LONG TERM GOAL #1   Title  pt will show documentation of home practice between at least 6 sessions    Time  4    Period  Weeks   or 17 sessions, for all LTGs   Status  Revised      SLP LONG TERM GOAL #2   Title  pt will request repeats for misunderstood comments/questions by SLP or another speaker appropriately 80% of the time    Time  4    Period  Weeks    Status  On-going      SLP LONG TERM GOAL #3   Title  pt will demo ID of verbal errors by verbal or nonverbal means 60% of the time in 3 therapy sessions    Time  4    Period  Weeks    Status  On-going      SLP LONG TERM GOAL #4   Title  pt will attempt multimodal communication in 3 therapy sessions    Time  4    Period  Weeks    Status  Revised       Plan - 02/24/19 1621    Clinical Impression Statement  Severe Wernicke's aphasia continues. Prognosis for improvement continues as decr'd due to pt's personality (refuses making mistakes expressively), pt's frustration level, and severity of deficit. SLP cont'd using augmentation of auditory comp by written single words and use of gestures for pt comprehension, and written, phonemic, and semantic cues for verbal expression. Pt did not attempt any verbalization which SLP was cueing for. Pt could benefit from skilled ST to improve communication effectiveness to be able to interact in the community, and reduce caregiver burden with greater  participation, however pt's motivation for therapy at this  time continues as reduced. SLP to cont to attempt ST utilizing high interest topics/language for 2-3 more visits in hopes pt's participation improves. If not, pt will need to be put on hold or d/c'd.    Speech Therapy Frequency  2x / week    Duration  --   8 weeks or 17 total sessions   Treatment/Interventions  Internal/external aids;Patient/family education;Compensatory strategies;Cognitive reorganization;SLP instruction and feedback;Cueing hierarchy;Language facilitation;Multimodal communcation approach;Functional tasks    Potential to Achieve Goals  Fair    Potential Considerations  Severity of impairments;Cooperation/participation level    Consulted and Agree with Plan of Care  Patient;Family member/caregiver    Family Member Consulted  dtr, [place]       Patient will benefit from skilled therapeutic intervention in order to improve the following deficits and impairments:   Aphasia    Problem List Patient Active Problem List   Diagnosis Date Noted  . 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 S/P percutaneous endoscopic gastrostomy (PEG) tube placement ([facility])   . Left temporal lobe hemorrhage ([facility]) 08/21/2018  . CKD (chronic kidney disease), stage III   . Acute blood loss anemia   . Diabetes mellitus type 2 in nonobese (HCC)   . Essential hypertension   . Wernicke's fluent aphasia   . Acute on chronic respiratory failure with hypoxia ([place])   . Chronic kidney disease, stage III (moderate)   . Nontraumatic intracranial hemorrhage, unspecified ([facility])   . Altered mental status, unspecified   . S/P AAA repair 02/01/2018    [facility] ,MS, CCC-SLP  02/24/2019, 4:25 PM  [facility] [address] [place], [place], [address] Phone: [phone]   Fax:  [phone]   Name: Srihith Aquilino MRN: [id number] Date of Birth: May 14, 1950

## 2019-02-26 ENCOUNTER — Ambulatory Visit: Payer: Medicare HMO | Admitting: Physical Therapy

## 2019-02-26 ENCOUNTER — Other Ambulatory Visit: Payer: Self-pay

## 2019-02-26 ENCOUNTER — Ambulatory Visit: Payer: Medicare HMO | Admitting: Occupational Therapy

## 2019-02-26 ENCOUNTER — Ambulatory Visit: Payer: Medicare HMO

## 2019-02-26 ENCOUNTER — Encounter: Payer: Self-pay | Admitting: Physical Therapy

## 2019-02-26 DIAGNOSIS — I69151 Hemiplegia and hemiparesis following nontraumatic intracerebral hemorrhage affecting right dominant side: Secondary | ICD-10-CM

## 2019-02-26 DIAGNOSIS — R4701 Aphasia: Secondary | ICD-10-CM

## 2019-02-26 DIAGNOSIS — R278 Other lack of coordination: Secondary | ICD-10-CM | POA: Diagnosis not present

## 2019-02-26 DIAGNOSIS — M6281 Muscle weakness (generalized): Secondary | ICD-10-CM

## 2019-02-26 DIAGNOSIS — I69115 Cognitive social or emotional deficit following nontraumatic intracerebral hemorrhage: Secondary | ICD-10-CM | POA: Diagnosis not present

## 2019-02-26 DIAGNOSIS — M25611 Stiffness of right shoulder, not elsewhere classified: Secondary | ICD-10-CM | POA: Diagnosis not present

## 2019-02-26 DIAGNOSIS — R41842 Visuospatial deficit: Secondary | ICD-10-CM | POA: Diagnosis not present

## 2019-02-26 DIAGNOSIS — R41841 Cognitive communication deficit: Secondary | ICD-10-CM | POA: Diagnosis not present

## 2019-02-26 DIAGNOSIS — R2689 Other abnormalities of gait and mobility: Secondary | ICD-10-CM | POA: Diagnosis not present

## 2019-02-26 NOTE — Therapy (Signed)
[facility] [address] [place], [place], [address] Phone: [phone]   Fax:  [phone]  Speech Language Pathology Treatment  Patient Details  Name: Mark [name] MRN: [id number] Date of Birth: 27-May-1950 Referring Provider (SLP): Murriel Hopper, MD   Encounter Date: 02/26/2019  End of Session - 02/26/19 1337    Visit Number  8    Number of Visits  17    Date for SLP Re-Evaluation  04/10/19    SLP Start Time  1150    SLP Stop Time   9702    SLP Time Calculation (min)  44 min    Activity Tolerance  Patient tolerated treatment well       Past Medical History:  Diagnosis Date  . Acute on chronic respiratory failure with hypoxia ([place])   . Altered mental status, unspecified   . Chronic kidney disease    CKD stage 3  . Chronic kidney disease, stage III (moderate)   . Concussion    multiple in high school  . Coronary artery disease    pt denies  . Diabetes mellitus without complication (HCC)    borderline  . Hypertension   . Nontraumatic intracranial hemorrhage, unspecified ([place])   . PVD (peripheral vascular disease) (HCC)    leg stents femoral artery  . Stroke [facility])    3 years ago- no residual    Past Surgical History:  Procedure Laterality Date  . ABDOMINAL AORTIC ENDOVASCULAR STENT GRAFT N/A 02/01/2018   Procedure: ABDOMINAL AORTIC ENDOVASCULAR STENT GRAFT;  Surgeon: Serafina Mitchell, MD;  Location: [facility];  Service: Vascular;  Laterality: N/A;  . NECK SURGERY    . VEIN BYPASS SURGERY      There were no vitals filed for this visit.  Subjective Assessment - 02/26/19 1152    Subjective  "Went out to get food."    Patient is accompained by:  Family member   [place] - dtr   Currently in Pain?  No/denies            ADULT SLP TREATMENT - 02/26/19 1153      General Information   Behavior/Cognition  Alert;Requires cueing;Hard of hearing;Agitated      Treatment Provided   Treatment  provided  Cognitive-Linquistic      Cognitive-Linquistic Treatment   Treatment focused on  Apraxia;Aphasia;Patient/family/caregiver education    Skilled Treatment  Stressed to pt/dtr that when/if they go see pt's lawyer that it will be necessary to use single written word cues (key words) to assist pt's comprehension. As topic shifted and SLP asked pt question with written and gestural support, pt did not shift topics with max cues from dtr and from SLP. SLP had to ultimately abandon question. This was seen once more in the session approx 5 minutes later when pt did not respond appropriately to another question but assumed SLP was talking about a frequently discussed topic during [place]. Despite max multimodal cues from SLP, abandoning the question was deemed the best option. Dtr relates that difficulty with grocery list and communicating that to family in another way other than leaving needed items on the table/counter for son or daughter to see and note once they arrive. SLP ascertained pt is not adept with a phone but dtr stated he might be able to use facetime to picture items to send to children via imessage. Dtr to bring grocery ad next visit or two so that we can see if this would work for him to  circle items he needs, for better communication.      Assessment / Recommendations / Plan   Plan  Continue with current plan of care      Progression Toward Goals   Progression toward goals  Not progressing toward goals (comment)   severity of deficit, pt motivation for change      SLP Education - 02/26/19 1336    Education Details  written cues needed during talk with lawyer, compensations for copmrehension and langauge production    Person(s) Educated  Patient;Child(ren)    Methods  Explanation;Demonstration    Comprehension  Verbalized understanding       SLP Short Term Goals - 02/26/19 1339      SLP SHORT TERM GOAL #1   Title  pt will correctly answer simple questions in context 80% of the  time over 3 sessions    Time  --    Period  --    Status  Not Met      SLP SHORT TERM GOAL #2   Title  pt will respond to requests for participation in therapy tasks by SLP in a manner conducive to therapy progressin ST for 6 sessions    Time  --    Period  --    Status  Not Met      SLP SHORT TERM GOAL #3   Title  pt will ID errors in verbal expression tasks by verbal (e.g., self correction, verbal disgust) or non-verbal (d.g., frowning, etc) responses at least 70% of the time in 3 sessions    Time  --    Period  --    Status  Not Met      SLP SHORT TERM GOAL #4   Title  Pt will ID at least 15 words/phrases to practice at home    Time  --    Period  --    Status  Not Met      SLP SHORT TERM GOAL #5   Title  pt will attempt multimodal communication in 5 therapy sessions    Time  --    Period  --    Status  Not Met       SLP Long Term Goals - 02/26/19 1339      SLP LONG TERM GOAL #1   Title  pt will show documentation of home practice between at least 6 sessions    Time  4    Period  Weeks   or 17 sessions, for all LTGs   Status  On-going      SLP LONG TERM GOAL #2   Title  pt will request repeats for misunderstood comments/questions by SLP or another speaker appropriately 80% of the time    Time  4    Period  Weeks    Status  On-going      SLP LONG TERM GOAL #3   Title  pt will demo ID of verbal errors by verbal or nonverbal means 60% of the time in 3 therapy sessions    Time  4    Period  Weeks    Status  On-going      SLP LONG TERM GOAL #4   Title  pt will attempt multimodal communication in 3 therapy sessions    Time  4    Period  Weeks    Status  On-going       Plan - 02/26/19 1337    Clinical Impression Statement  Severe Wernicke's aphasia continues. Prognosis for  improvement continues as decr'd due to pt's limited motivation for change/decr'd cooperation, and severity of deficit. See "skilled intervention" for details. SLP cont'd using augmentation  of auditory comp by written single words and use of gestures for pt comprehension, and written, phonemic, and semantic cues for verbal expression. Pt could benefit from skilled ST to improve communication effectiveness to be able to interact in the community, and reduce caregiver burden with greater participation, however pt's motivation for therapy at this time continues as reduced. SLP to cont to attempt ST utilizing high interest topics/language for 2-3 more visits in hopes pt's participation improves. If not, pt will need to be put on hold or d/c'd.    Speech Therapy Frequency  2x / week    Duration  --   8 weeks or 17 total sessions   Treatment/Interventions  Internal/external aids;Patient/family education;Compensatory strategies;Cognitive reorganization;SLP instruction and feedback;Cueing hierarchy;Language facilitation;Multimodal communcation approach;Functional tasks    Potential to Achieve Goals  Fair    Potential Considerations  Severity of impairments;Cooperation/participation level    Consulted and Agree with Plan of Care  Patient;Family member/caregiver    Family Member Consulted  dtr, [place]       Patient will benefit from skilled therapeutic intervention in order to improve the following deficits and impairments:   Aphasia    Problem List Patient Active Problem List   Diagnosis Date Noted  . 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 S/P percutaneous endoscopic gastrostomy (PEG) tube placement ([place])   . 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 CKD (chronic kidney disease), stage III   . Acute blood loss anemia   . Diabetes mellitus type 2 in nonobese ([facility])   . Essential hypertension   . Wernicke's fluent aphasia   . Acute on chronic respiratory failure with hypoxia ([place])   . Chronic kidney disease, stage III (moderate)   . Nontraumatic intracranial hemorrhage, unspecified ([place])   . Altered mental status, unspecified   . S/P AAA  repair 02/01/2018    [facility] ,MS, CCC-SLP  02/26/2019, 1:40 PM  [facility] [address] [place], [place], [address] Phone: [phone]   Fax:  [phone]   Name: Mark [name] MRN: [id number] Date of Birth: [birth year]

## 2019-02-26 NOTE — Therapy (Signed)
[facility] [address] [place], [place], [address] Phone: [phone]   Fax:  [phone]  Physical Therapy Treatment/Discharge Summary  Patient Details  Name: Mark [name] MRN: [id number] Date of Birth: 17-Jun-1950 No data recorded  Encounter Date: 02/26/2019  PT End of Session - 02/26/19 1514    Visit Number  7    Number of Visits  8    Date for PT Re-Evaluation  03/20/19    Authorization Type  [employer] MCR    PT Start Time  1234    PT Stop Time  1315    PT Time Calculation (min)  41 min    Equipment Utilized During Treatment  Gait belt    Activity Tolerance  Patient tolerated treatment well    Behavior During Therapy  [facility] for tasks assessed/performed       Past Medical History:  Diagnosis Date  . Acute on chronic respiratory failure with hypoxia ([place])   . Altered mental status, unspecified   . Chronic kidney disease    CKD stage 3  . Chronic kidney disease, stage III (moderate)   . Concussion    multiple in high school  . Coronary artery disease    pt denies  . Diabetes mellitus without complication (HCC)    borderline  . Hypertension   . Nontraumatic intracranial hemorrhage, unspecified ([place])   . PVD (peripheral vascular disease) (HCC)    leg stents femoral artery  . Stroke [facility])    3 years ago- no residual    Past Surgical History:  Procedure Laterality Date  . ABDOMINAL AORTIC ENDOVASCULAR STENT GRAFT N/A 02/01/2018   Procedure: ABDOMINAL AORTIC ENDOVASCULAR STENT GRAFT;  Surgeon: Serafina Mitchell, MD;  Location: [facility];  Service: Vascular;  Laterality: N/A;  . NECK SURGERY    . VEIN BYPASS SURGERY      There were no vitals filed for this visit.  Subjective Assessment - 02/26/19 1235    Subjective  No falls. Went to the grocery store on Friday.    Patient is accompained by:  --   daughter Scientist, water quality   Pertinent History  history of HTN, AAA, ASCVD, CVA 3 years ago and on 07/05/2018,difficulty  weaning from vent    How long can you stand comfortably?  unsure    How long can you walk comfortably?  unsure    Patient Stated Goals  daugher wants to improve strength, balance, endurance, cognition    Currently in Pain?  No/denies                       [facility] Adult PT Treatment/Exercise - 02/26/19 0001      Ambulation/Gait   Ambulation/Gait  Yes    Ambulation/Gait Assistance  5: Supervision    Ambulation/Gait Assistance Details  Pt did not bump into any objects on his right today. Dynamic gait activity: throughout therapy gym taped 10 laminated number squares around gym to pt's R for pt to find. Pt found 6 numbers during 1st rap, 0 numbers during 2nd lap, needing minimal simple cues during 3rd lap on where to scan to find remaining numbers. Cues to maintain gait speed throughout activity.     Ambulation Distance (Feet)  600 Feet   approx throughout session.     Therapeutic Activites    Therapeutic Activities  Other Therapeutic Activities    Lifting       Other Therapeutic Activities  Reviewed supine strengthening exercises for pt's  HEP - pt needing simple verbal and demonstrative cues for how to perform. Cues throughout to perform slowly, as pt has tendency to hurry through exercises. Printed out a new handout and gave to pt's daughter who states that the pt will be having a caregiver present. Instructed to show caregiver handout of all exercises with instructions and pictures in order for pt to perform at home for compliance, as pt will not perform them on his own. Also printed out exercise tracker with exercises. Pt's daughter verbalized understanding.          Access Code: [email or web address]  URL: [email or web address]  Date: 02/26/2019  Prepared by: Janann August    Exercises Supine Bridge - 10 reps - 3 sets - 1x daily - 7x weekly Supine Active Straight Leg Raise - 10 reps - 1-3 sets - 2x daily - 6x weekly Clamshell with Resistance - 10 reps - 3 sets - 2x  daily - 6x weekly Heel Toe Raises with Counter Support - 10 reps - 2 sets - 2x daily - 6x weekly Walking Tandem Stance - 2 reps - 2x daily - 6x weekly Walking with Head Rotation - 2 reps - 2x daily - 6x weekly Sidestepping in Squat with Resistance and Arms Forward - 10 reps - 1-2 sets - 2x daily - 6x weekly - attempted to review today in // bars, first with movement without resistance, demonstrated slow and controlled mini squat side stepping, pt hurriedly performs running back and forth stating "he is playing basketball", pt unable to listen to cues to slow movement down.  Sit to Stand without Arm Support - 10 reps - 1-2 sets - 2x daily - 6x weekly Seated Ankle Dorsiflexion with Anchored Resistance - 10 reps - 3 sets - 2x daily - 6x weekly - with green theraband.      PT Education - 02/26/19 1519    Education Details  see TA, progress towards goals, D/C from PT    Person(s) Educated  Patient;Child(ren)    Methods  Explanation;Handout    Comprehension  Verbalized understanding          PT Long Term Goals - 02/26/19 1512      PT LONG TERM GOAL #1   Title  Pt will be I and compliant with HEP. ALL LTGS DUE 03/11/19    Baseline  pt's daughter reports pt has not been consistently performing HEP at home.    Time  3    Period  Weeks    Status  Not Met     PT LONG TERM GOAL #2   Title  Pt will improve FGA score to at least 28/30 to show improved balance.    Baseline  26/30 on 02/18/19    Status  Not Met      PT LONG TERM GOAL #3   Title  Pt will improve overall hip strength to 4+/5 bilat, knee strength to 5/5 bilat, and Rt ankle strength to 4+/5 to improve functional strength.    Baseline  ankle DF and hip abduction strength 5/5    Status  Achieved      PT LONG TERM GOAL #4   Title  Pt will improve Rt side neglect/inattention with gait and be about to avoid running into objects and improve scanning his enviornment with no instances of bumping into objects during therapy session.     Baseline  pt with no instances of bumping into objects on his R throughout session today with gait    Status  Achieved          02/27/19 0840  Plan  Clinical Impression Statement Practiced dynamic gait activities with pt scanning environment towards R to find 10 laminated numbers - pt able to find 6 during first 2 laps and needed minimal verbal cueing to find remaining 4 during last lap of gait. Pt did not bump into any objects during activity today. Pt still needs cueing to perform exercises slowly, as pt tends to rush through them. Pt has met 2 out of 4 LTGs. Pt's FGA score is a 26/30 (was 21/30 at evaluation). Discussed with pt's daughter that in order for pt to continue to build upon strength and balance gains made in therapy, pt will need to perform his HEP at home. Daughter reports he is currently non-compliant and due to aphasia, pt unable to express if he has been performing or not. Pt's daughter states that an aide will be coming in to help the pt, therapist printed out another copy of HEP and instructions with exercise log to provide to caregiver to monitor exercises going forward. At this time pt will be discharged from PT - and is agreeable to D/C.  Personal Factors and Comorbidities Comorbidity 3+  Comorbidities history of HTN, AAA, ASCVD, CVA 3 years ago and on 07/05/2018, with diagnosis of left temporal AVM was admitted to [facility] on 07/05/2018.  Examination-Activity Limitations Carry;Squat;Stairs;Lift;Locomotion Level  Examination-Participation Restrictions Community Activity;Driving;Yard Work;Shop;Meal Prep;Cleaning  Pt will benefit from skilled therapeutic intervention in order to improve on the following deficits Abnormal gait;Decreased activity tolerance;Decreased balance;Decreased coordination;Decreased endurance;Decreased range of motion;Decreased strength;Difficulty walking;Impaired flexibility;Pain  Stability/Clinical Decision Making Evolving/Moderate  complexity  Rehab Potential Good  PT Frequency 1x / week  PT Duration 3 weeks  PT Treatment/Interventions ADLs/Self Care Home Management;Electrical Stimulation;Cryotherapy;Moist Heat;Gait training;Therapeutic activities;Therapeutic exercise;Balance training;Neuromuscular re-education;Manual techniques;Orthotic Fit/Training;Taping  PT Next Visit Plan D/C  PT Home Exercise Plan Access Code: [email or web address]  Consulted and Agree with Plan of Care Patient      PHYSICAL THERAPY DISCHARGE SUMMARY  Visits from Start of Care: 7  Current functional level related to goals / functional outcomes: See LTGs.   Remaining deficits: Decreased awareness of R environment, difficulty following multi-step commands, impaired balance with eyes closed.   Education / Equipment: HEP   Plan: Patient agrees to discharge.  Patient goals were partially met. Patient is being discharged due to meeting the stated rehab goals.  ?????        Patient will benefit from skilled therapeutic intervention in order to improve the following deficits and impairments:     Visit Diagnosis: Muscle weakness (generalized)  Other abnormalities of gait and mobility  Hemiplegia and hemiparesis following nontraumatic intracerebral hemorrhage affecting right dominant side [facility])     Problem List Patient Active Problem List   Diagnosis Date Noted  . 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 S/P percutaneous endoscopic gastrostomy (PEG) tube placement ([place])   . Left temporal lobe hemorrhage ([place]) 08/21/2018  . CKD (chronic kidney disease), stage III   . Acute blood loss anemia   . Diabetes mellitus type 2 in nonobese (HCC)   . Essential hypertension   . Wernicke's fluent aphasia   . Acute on chronic respiratory failure with hypoxia ([place])   . Chronic kidney disease, stage III (moderate)   . Nontraumatic intracranial hemorrhage, unspecified ([place])   . Altered mental status, unspecified   .  S/P AAA repair 02/01/2018    Arliss Journey, PT, DPT  02/26/2019, 3:23 PM  [facility] [address] [place], [place], [address] Phone: [phone]   Fax:  [phone]  Name: Mark [name] MRN: [id number] Date of Birth: Nov 25, 1950

## 2019-03-04 ENCOUNTER — Ambulatory Visit: Payer: Medicare HMO | Admitting: Physical Therapy

## 2019-03-04 ENCOUNTER — Other Ambulatory Visit: Payer: Self-pay

## 2019-03-04 ENCOUNTER — Ambulatory Visit: Payer: Medicare HMO | Attending: Student

## 2019-03-04 ENCOUNTER — Ambulatory Visit: Payer: Medicare HMO | Admitting: Occupational Therapy

## 2019-03-04 DIAGNOSIS — R4701 Aphasia: Secondary | ICD-10-CM | POA: Diagnosis not present

## 2019-03-04 DIAGNOSIS — R41841 Cognitive communication deficit: Secondary | ICD-10-CM | POA: Insufficient documentation

## 2019-03-04 NOTE — Therapy (Signed)
[facility] [address] [place], [place], [address] Phone: [phone]   Fax:  [phone]  Speech Language Pathology Treatment  Patient Details  Name: Mark [name] MRN: [id number] Date of Birth: [birth year] Referring Provider (SLP): Murriel Hopper, MD   Encounter Date: 03/04/2019  End of Session - 03/04/19 1318    Visit Number  9    Number of Visits  17    Date for SLP Re-Evaluation  04/10/19    SLP Start Time  1149    SLP Stop Time   1230    SLP Time Calculation (min)  41 min    Activity Tolerance  Patient tolerated treatment well       Past Medical History:  Diagnosis Date  . Acute on chronic respiratory failure with hypoxia ([place])   . Altered mental status, unspecified   . Chronic kidney disease    CKD stage 3  . Chronic kidney disease, stage III (moderate)   . Concussion    multiple in high school  . Coronary artery disease    pt denies  . Diabetes mellitus without complication (HCC)    borderline  . Hypertension   . Nontraumatic intracranial hemorrhage, unspecified ([place])   . PVD (peripheral vascular disease) (HCC)    leg stents femoral artery  . Stroke [facility])    3 years ago- no residual    Past Surgical History:  Procedure Laterality Date  . ABDOMINAL AORTIC ENDOVASCULAR STENT GRAFT N/A 02/01/2018   Procedure: ABDOMINAL AORTIC ENDOVASCULAR STENT GRAFT;  Surgeon: Serafina Mitchell, MD;  Location: [facility];  Service: Vascular;  Laterality: N/A;  . NECK SURGERY    . VEIN BYPASS SURGERY      There were no vitals filed for this visit.  Subjective Assessment - 03/04/19 1252    Subjective  (end of session, SLP telling pt his next appt day/time while pointing to calendar) "You mean I gotta come back? Aren't we done with this?"            ADULT SLP TREATMENT - 03/04/19 1150      General Information   Behavior/Cognition  Alert;Requires cueing;Hard of hearing;Agitated      Treatment Provided    Treatment provided  Cognitive-Linquistic      Cognitive-Linquistic Treatment   Treatment focused on  Apraxia;Aphasia;Patient/family/caregiver education    Skilled Treatment  SLP needing to ask pt using mod cues simple conversation questions (who he watched game with Friday night, who drove him here today). SLP addressed daughter's concern about pt telling her what he needed from grocery store prior to her arriving in pt house in order to expedite the process - pt req'd picture, gestural cues for comprehension and finally stated, "she just has to come with me." SLP attempted to change topics to lawyer conversation showing pt he will NEED TO understand and have some way to communicate (thus validating cont'd ST) with lawyer and children with visual, verbal gestural cues and pt talked about the way money was split after his wife died, then pt said something about [facility] to SLP so SLP pulled up map of [place] and through careful listening, yes/no questions (some pt answered incorrectly) and despite pt mild-mod agitation SLP ascertained pt has/had a house in [place] and used to play golf there. SLP abandoned lawyer topic and joined with pt's topic. SLP attempted to ask pt whrere he played golf using Google map cues but pt did not understand question  or could not locate the course. SLP told pt son that SLP beginning to believe further ST may not be possilble and it may be best if dtr/son "do therapy" throughout the day as pt allows them to help him repeat terms/items/objects, and highlight when pt makes errors. Pt cont with poor awareness of errors and incr'd frustration with not being understood.      Assessment / Recommendations / Plan   Plan  Continue with current plan of care      Progression Toward Goals   Progression toward goals  Not progressing toward goals (comment)   severity, decr'd motivation      SLP Education - 03/04/19 1318    Education Details  cont through next week,  maybe woman therapist will have different effect?    Person(s) Educated  Child(ren)    Methods  Explanation    Comprehension  Verbalized understanding       SLP Short Term Goals - 02/26/19 1339      SLP SHORT TERM GOAL #1   Title  pt will correctly answer simple questions in context 80% of the time over 3 sessions    Time  --    Period  --    Status  Not Met      SLP SHORT TERM GOAL #2   Title  pt will respond to requests for participation in therapy tasks by SLP in a manner conducive to therapy progressin ST for 6 sessions    Time  --    Period  --    Status  Not Met      SLP SHORT TERM GOAL #3   Title  pt will ID errors in verbal expression tasks by verbal (e.g., self correction, verbal disgust) or non-verbal (d.g., frowning, etc) responses at least 70% of the time in 3 sessions    Time  --    Period  --    Status  Not Met      SLP SHORT TERM GOAL #4   Title  Pt will ID at least 15 words/phrases to practice at home    Time  --    Period  --    Status  Not Met      SLP SHORT TERM GOAL #5   Title  pt will attempt multimodal communication in 5 therapy sessions    Time  --    Period  --    Status  Not Met       SLP Long Term Goals - 03/04/19 1320      SLP LONG TERM GOAL #1   Title  pt will show documentation of home practice between at least 6 sessions    Time  3    Period  Weeks   or 17 sessions, for all LTGs   Status  On-going      SLP LONG TERM GOAL #2   Title  pt will request repeats for misunderstood comments/questions by SLP or another speaker appropriately 80% of the time    Time  3    Period  Weeks    Status  On-going      SLP LONG TERM GOAL #3   Title  pt will demo ID of verbal errors by verbal or nonverbal means 60% of the time in 3 therapy sessions    Time  3    Period  Weeks    Status  On-going      SLP LONG TERM GOAL #4   Title  pt  will attempt multimodal communication in 3 therapy sessions    Time  3    Period  Weeks    Status  On-going        Plan - 03/04/19 1319    Clinical Impression Statement  Severe Wernicke's aphasia continues. Prognosis for improvement continues as decr'd due to pt's limited motivation for change/decr'd cooperation, and severity of deficit. SLP suggested cont through next week, and then possibly switch therapists as an option - pt may be more tolerant of errors and/or perceived misunderstanding with a woman than a man (?) .See "skilled intervention" for details. SLP cont'd using augmentation of auditory comp by written single words and use of gestures for pt comprehension, and written, phonemic, and semantic cues for verbal expression. Pt could benefit from skilled ST to improve communication effectiveness to be able to interact in the community, and reduce caregiver burden with greater participation, however pt's motivation for therapy at this time continues as reduced. SLP to cont to attempt ST utilizing high interest topics/language for 2-3 more visits in hopes pt's participation improves. If not, pt will need to be put on hold or d/c'd.    Speech Therapy Frequency  2x / week    Duration  --   8 weeks or 17 total sessions   Treatment/Interventions  Internal/external aids;Patient/family education;Compensatory strategies;Cognitive reorganization;SLP instruction and feedback;Cueing hierarchy;Language facilitation;Multimodal communcation approach;Functional tasks    Potential to Achieve Goals  Fair    Potential Considerations  Severity of impairments;Cooperation/participation level    Consulted and Agree with Plan of Care  Patient;Family member/caregiver    Family Member Consulted  dtr, [place]       Patient will benefit from skilled therapeutic intervention in order to improve the following deficits and impairments:   Aphasia  Cognitive communication deficit    Problem List Patient Active Problem List   Diagnosis Date Noted  . 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 S/P percutaneous endoscopic gastrostomy (PEG) tube placement ([place])   . 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 CKD (chronic kidney disease), stage III   . Acute blood loss anemia   . Diabetes mellitus type 2 in nonobese (HCC)   . Essential hypertension   . Wernicke's fluent aphasia   . Acute on chronic respiratory failure with hypoxia ([place])   . Chronic kidney disease, stage III (moderate)   . Nontraumatic intracranial hemorrhage, unspecified ([place])   . Altered mental status, unspecified   . S/P AAA repair 02/01/2018    [facility] ,MS, CCC-SLP  03/04/2019, 1:21 PM  [facility] [address] [place], [place], [address] Phone: [phone]   Fax:  [phone]   Name: Francesco Provencal MRN: [id number] Date of Birth: 30-Mar-1950

## 2019-03-07 ENCOUNTER — Encounter: Payer: Medicare HMO | Admitting: Occupational Therapy

## 2019-03-07 ENCOUNTER — Ambulatory Visit: Payer: Medicare HMO | Admitting: Physical Therapy

## 2019-03-10 ENCOUNTER — Encounter: Payer: Medicare HMO | Admitting: Occupational Therapy

## 2019-03-10 ENCOUNTER — Other Ambulatory Visit: Payer: Self-pay

## 2019-03-10 ENCOUNTER — Encounter: Payer: Self-pay | Admitting: Speech Pathology

## 2019-03-10 ENCOUNTER — Ambulatory Visit: Payer: Medicare HMO | Admitting: Speech Pathology

## 2019-03-10 ENCOUNTER — Ambulatory Visit: Payer: Medicare HMO | Admitting: Physical Therapy

## 2019-03-10 DIAGNOSIS — R4701 Aphasia: Secondary | ICD-10-CM

## 2019-03-10 DIAGNOSIS — R41841 Cognitive communication deficit: Secondary | ICD-10-CM | POA: Diagnosis not present

## 2019-03-10 NOTE — Therapy (Signed)
[facility] [address] [place], [place], [address] Phone: [phone]   Fax:  [phone]  Speech Language Pathology Treatment  Patient Details  Name: Mark [name] MRN: [id number] Date of Birth: [birth year] Referring Provider (SLP): Mark Hopper, MD   Encounter Date: 03/10/2019  End of Session - 03/10/19 1259    Visit Number  10    Number of Visits  17    Date for SLP Re-Evaluation  04/10/19    SLP Start Time  1146    SLP Stop Time   1228    SLP Time Calculation (min)  42 min    Activity Tolerance  Patient tolerated treatment well       Past Medical History:  Diagnosis Date  . Acute on chronic respiratory failure with hypoxia ([place])   . Altered mental status, unspecified   . Chronic kidney disease    CKD stage 3  . Chronic kidney disease, stage III (moderate)   . Concussion    multiple in high school  . Coronary artery disease    pt denies  . Diabetes mellitus without complication (HCC)    borderline  . Hypertension   . Nontraumatic intracranial hemorrhage, unspecified ([place])   . PVD (peripheral vascular disease) (HCC)    leg stents femoral artery  . Stroke [facility])    3 years ago- no residual    Past Surgical History:  Procedure Laterality Date  . ABDOMINAL AORTIC ENDOVASCULAR STENT GRAFT N/A 02/01/2018   Procedure: ABDOMINAL AORTIC ENDOVASCULAR STENT GRAFT;  Surgeon: Serafina Mitchell, MD;  Location: [facility];  Service: Vascular;  Laterality: N/A;  . NECK SURGERY    . VEIN BYPASS SURGERY      There were no vitals filed for this visit.  Subjective Assessment - 03/10/19 1234    Subjective  "We've been doing this already"    Patient is accompained by:  --   friend, Heidi   Currently in Pain?  No/denies            ADULT SLP TREATMENT - 03/10/19 1235      General Information   Behavior/Cognition  Alert;Requires cueing;Hard of hearing;Agitated      Treatment Provided   Treatment provided   Cognitive-Linquistic      Cognitive-Linquistic Treatment   Treatment focused on  Apraxia;Aphasia;Patient/family/caregiver education    Skilled Treatment  Pt continues to require simple word written cues and gestural cues to participate in simple conversation and to answer simple yes/no questions. Pt talking about the [facility] football game tonight. He required written choice to communicate who [place] was playing. He also required written choices to communicate  golf courses he played in [place].  Consistent word level written choices and topic words required to communicate preferences and personal information. Pt continues to verbalize frustration with attending speech therapy.  As son or daughter were not present for ST today, will discuss with Konrad Dolores and his children to continue ST or take a break at this time.       Assessment / Recommendations / Plan   Plan  Continue with current plan of care      Progression Toward Goals   Progression toward goals  Not progressing toward goals (comment)   agitiation reduced participation      SLP Education - 03/10/19 1256    Education Details  life alert, alert fire station that [place] won't be able to communicate if he calls 911    Person(s) Educated  Other (comment)   friend, Heidi   Methods  Explanation    Comprehension  Verbalized understanding        Speech Therapy Progress Note  Dates of Reporting Period: 01/09/19 to 03/10/19  Objective Reports of Subjective Statement: Pt continues to require consistent 1 word written cues, gestural cues to comprehend simple questions/conversation. He requires mod to max A to ID expressive errors  Objective Measurements: See goals  Goal Update: Will continue goals at this time.   Plan: Continue POC at this time  Reason Skilled Services are Required: Pt continues to exhibit frustration communicating with family. He has not carried over compensatory strategies due to reduced cooperation. His children  have attended every session, however they were called away today and a friend attended session with [name]. As daughter will be present next session, we will determine if continuing ST is warranted at this time, or if pt would benefit from a break.    SLP Short Term Goals - 03/10/19 1258      SLP SHORT TERM GOAL #1   Title  pt will correctly answer simple questions in context 80% of the time over 3 sessions    Status  Not Met      SLP SHORT TERM GOAL #2   Title  pt will respond to requests for participation in therapy tasks by SLP in a manner conducive to therapy progressin ST for 6 sessions    Status  Not Met      SLP SHORT TERM GOAL #3   Title  pt will ID errors in verbal expression tasks by verbal (e.g., self correction, verbal disgust) or non-verbal (d.g., frowning, etc) responses at least 70% of the time in 3 sessions    Status  Not Met      SLP SHORT TERM GOAL #4   Title  Pt will ID at least 15 words/phrases to practice at home    Status  Not Met      SLP SHORT TERM GOAL #5   Title  pt will attempt multimodal communication in 5 therapy sessions    Status  Not Met       SLP Long Term Goals - 03/10/19 [place] #1   Title  pt will show documentation of home practice between at least 6 sessions    Time  2    Period  Weeks   or 17 sessions, for all LTGs   Status  On-going      SLP LONG TERM GOAL #2   Title  pt will request repeats for misunderstood comments/questions by SLP or another speaker appropriately 80% of the time    Time  2    Period  Weeks    Status  On-going      SLP LONG TERM GOAL #3   Title  pt will demo ID of verbal errors by verbal or nonverbal means 60% of the time in 3 therapy sessions    Time  2    Period  Weeks    Status  On-going      SLP LONG TERM GOAL #4   Title  pt will attempt multimodal communication in 3 therapy sessions    Time  2    Period  Weeks    Status  On-going       Plan - 03/10/19 1257    Clinical  Impression Statement  Severe Wernicke's aphasia continues. Prognosis for improvement continues as decr'd due to  pt's limited motivation for change/decr'd cooperation, and severity of deficit. SLP suggested cont through next week, and then possibly switch therapists as an option - pt may be more tolerant of errors and/or perceived misunderstanding with a woman than a man (?) .See "skilled intervention" for details. SLP cont'd using augmentation of auditory comp by written single words and use of gestures for pt comprehension, and written, phonemic, and semantic cues for verbal expression. Pt could benefit from skilled ST to improve communication effectiveness to be able to interact in the community, and reduce caregiver burden with greater participation, however pt's motivation for therapy at this time continues as reduced. SLP to cont to attempt ST utilizing high interest topics/language for 2-3 more visits in hopes pt's participation improves. If not, pt will need to be put on hold or d/c'd.    Speech Therapy Frequency  2x / week    Duration  --   8 weeks or 17 visits   Treatment/Interventions  Internal/external aids;Patient/family education;Compensatory strategies;Cognitive reorganization;SLP instruction and feedback;Cueing hierarchy;Language facilitation;Multimodal communcation approach;Functional tasks    Potential Considerations  Severity of impairments;Cooperation/participation level    Consulted and Agree with Plan of Care  Patient;Family member/caregiver       Patient will benefit from skilled therapeutic intervention in order to improve the following deficits and impairments:   Aphasia  Cognitive communication deficit    Problem List Patient Active Problem List   Diagnosis Date Noted  . 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 S/P percutaneous endoscopic gastrostomy (PEG) tube placement ([place])   . 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 CKD  (chronic kidney disease), stage III   . Acute blood loss anemia   . Diabetes mellitus type 2 in nonobese (HCC)   . Essential hypertension   . Wernicke's fluent aphasia   . Acute on chronic respiratory failure with hypoxia ([place])   . Chronic kidney disease, stage III (moderate)   . Nontraumatic intracranial hemorrhage, unspecified ([place])   . Altered mental status, unspecified   . S/P AAA repair 02/01/2018    Chere Babson, Annye Rusk MS, CCC-SLP 03/10/2019, 1:01 PM  [facility] [address] [place], [place], [address] Phone: [phone]   Fax:  [phone]   Name: Mark [name] MRN: [id number] Date of Birth: [birth year]

## 2019-03-10 NOTE — Patient Instructions (Signed)
   Stop by the local Fire station and let them know that if Mark [name] calls 911, he has aphasia and won't be able to tell you what is wrong  Consider a Life Alert button for when he is left alone

## 2019-03-12 ENCOUNTER — Ambulatory Visit: Payer: Medicare HMO | Admitting: Physical Therapy

## 2019-03-12 ENCOUNTER — Encounter: Payer: Self-pay | Admitting: Speech Pathology

## 2019-03-12 ENCOUNTER — Encounter: Payer: Medicare HMO | Admitting: Occupational Therapy

## 2019-03-12 ENCOUNTER — Ambulatory Visit: Payer: Medicare HMO | Admitting: Speech Pathology

## 2019-03-12 ENCOUNTER — Other Ambulatory Visit: Payer: Self-pay

## 2019-03-12 DIAGNOSIS — R41841 Cognitive communication deficit: Secondary | ICD-10-CM

## 2019-03-12 DIAGNOSIS — R4701 Aphasia: Secondary | ICD-10-CM

## 2019-03-12 NOTE — Therapy (Signed)
[facility] [address] [place], [place], [address] Phone: [phone]   Fax:  [phone]  Speech Language Pathology Treatment & Discharge Summary  Patient Details  Name: Mark [name] MRN: [id number] Date of Birth: September 22, 1950 Referring Provider (SLP): Murriel Hopper, MD   Encounter Date: 03/12/2019  End of Session - 03/12/19 1301    Visit Number  11    Number of Visits  17    Date for SLP Re-Evaluation  04/10/19    SLP Start Time  1147    SLP Stop Time   1231    SLP Time Calculation (min)  44 min    Activity Tolerance  Patient tolerated treatment well       Past Medical History:  Diagnosis Date  . Acute on chronic respiratory failure with hypoxia ([place])   . Altered mental status, unspecified   . Chronic kidney disease    CKD stage 3  . Chronic kidney disease, stage III (moderate)   . Concussion    multiple in high school  . Coronary artery disease    pt denies  . Diabetes mellitus without complication (HCC)    borderline  . Hypertension   . Nontraumatic intracranial hemorrhage, unspecified ([place])   . PVD (peripheral vascular disease) (HCC)    leg stents femoral artery  . Stroke [facility])    3 years ago- no residual    Past Surgical History:  Procedure Laterality Date  . ABDOMINAL AORTIC ENDOVASCULAR STENT GRAFT N/A 02/01/2018   Procedure: ABDOMINAL AORTIC ENDOVASCULAR STENT GRAFT;  Surgeon: Serafina Mitchell, MD;  Location: [facility];  Service: Vascular;  Laterality: N/A;  . NECK SURGERY    . VEIN BYPASS SURGERY      There were no vitals filed for this visit.  Subjective Assessment - 03/12/19 1245    Subjective  "I called the emergency system and let them know"    Patient is accompained by:  Family member    Currently in Pain?  No/denies            ADULT SLP TREATMENT - 03/12/19 1219      General Information   Behavior/Cognition  Alert;Requires cueing;Hard of hearing;Agitated      Treatment Provided   Treatment provided  Cognitive-Linquistic      Cognitive-Linquistic Treatment   Treatment focused on  Apraxia;Aphasia;Patient/family/caregiver education    Skilled Treatment  Cottie Banda reported her father showed her the foods he wants by showing her what he is using at home. Konrad Dolores was able to ID and choose a food he liked and didn't like wihen presented with 2 written and verbal cues. He required max written, phonemic and repeition cues to say 2/3 grandkids' names and cities where they live.  Beyke agreed to try simple written words at home to add to language stimulation. A t this time, Konrad Dolores is not wanting to attend ST. We decided to take a break and Thomes Dinning knows to get an order from [place] [facility] in 3-6 months for another course of ST      Assessment / Recommendations / Plan   Plan  Discharge SLP treatment due to (comment)   reduced cooperation and participation     Progression Toward Goals   Progression toward goals  Not progressing toward goals (comment)       SLP Education - 03/12/19 1252    Education Details  get new order from MD if [name] wishes to return to ST 3-6 months    Methods  Explanation    Comprehension  Verbalized understanding      SPEECH THERAPY DISCHARGE SUMMARY  Visits from Start of Care: 11  Current functional level related to goals / functional outcomes: See goals below   Remaining deficits: Severe Fluent (Wernkie's) aphasia   Education / Equipment: Multimodal communication, compensations for aphasia  Plan: Patient agrees to discharge.  Patient goals were not met. Patient is being discharged due to the patient's request.  ?????            SLP Short Term Goals - 03/12/19 1300      SLP SHORT TERM GOAL #1   Title  pt will correctly answer simple questions in context 80% of the time over 3 sessions    Status  Not Met      SLP SHORT TERM GOAL #2   Title  pt will respond to requests for participation in therapy tasks by SLP in a manner  conducive to therapy progressin ST for 6 sessions    Status  Not Met      SLP SHORT TERM GOAL #3   Title  pt will ID errors in verbal expression tasks by verbal (e.g., self correction, verbal disgust) or non-verbal (d.g., frowning, etc) responses at least 70% of the time in 3 sessions    Status  Not Met      SLP SHORT TERM GOAL #4   Title  Pt will ID at least 15 words/phrases to practice at home    Status  Not Met      SLP SHORT TERM GOAL #5   Title  pt will attempt multimodal communication in 5 therapy sessions    Status  Not Met       SLP Long Term Goals - 03/12/19 1300      SLP LONG TERM GOAL #1   Title  pt will show documentation of home practice between at least 6 sessions    Time  2    Period  Weeks   or 17 sessions, for all LTGs   Status  Not Met      SLP LONG TERM GOAL #2   Title  pt will request repeats for misunderstood comments/questions by SLP or another speaker appropriately 80% of the time    Time  2    Period  Weeks    Status  Not Met      SLP LONG TERM GOAL #3   Title  pt will demo ID of verbal errors by verbal or nonverbal means 60% of the time in 3 therapy sessions    Time  2    Period  Weeks    Status  Not Met      SLP LONG TERM GOAL #4   Title  pt will attempt multimodal communication in 3 therapy sessions    Time  2    Period  Weeks    Status  On-going      SLP LONG TERM GOAL #5   Status  Not Met       Plan - 03/12/19 1254    Clinical Impression Statement  Severe Wernike's aphasia persists. Improvement affected by pt's limited motivation/participation as well as severity of aphasia. Pt is not wanting to attend ST. At this time, we agreed to take a break from speech therapy. Thomes Dinning knows to get a new order for ST in 3-6 months    Speech Therapy Frequency  2x / week    Duration  --   8 weeks or  17 visits   Treatment/Interventions  Internal/external aids;Patient/family education;Compensatory strategies;Cognitive reorganization;SLP instruction and  feedback;Cueing hierarchy;Language facilitation;Multimodal communcation approach;Functional tasks    Potential to Achieve Goals  Fair    Potential Considerations  Severity of impairments;Cooperation/participation level    Consulted and Agree with Plan of Care  Patient;Family member/caregiver    Family Member Consulted  dtr, [place]       Patient will benefit from skilled therapeutic intervention in order to improve the following deficits and impairments:   Aphasia  Cognitive communication deficit    Problem List Patient Active Problem List   Diagnosis Date Noted  . 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 S/P percutaneous endoscopic gastrostomy (PEG) tube placement ([place])   . 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 CKD (chronic kidney disease), stage III   . Acute blood loss anemia   . Diabetes mellitus type 2 in nonobese (HCC)   . Essential hypertension   . Wernicke's fluent aphasia   . Acute on chronic respiratory failure with hypoxia ([place])   . Chronic kidney disease, stage III (moderate)   . Nontraumatic intracranial hemorrhage, unspecified ([place])   . Altered mental status, unspecified   . S/P AAA repair 02/01/2018    Moreen Piggott, Annye Rusk MS, CCC-SLP 03/12/2019, 1:03 PM  [facility] [address] [place], [place], [address] Phone: [phone]   Fax:  [phone]   Name: Mark [name] MRN: [id number] Date of Birth: [birth year]

## 2019-03-12 NOTE — Patient Instructions (Signed)
   Try using short written words to help get some more language cues  Let's take a break from ST now, you can call your doctor and ask for an order for Speech Therapy

## 2019-03-17 ENCOUNTER — Encounter: Payer: Medicare HMO | Admitting: Occupational Therapy

## 2019-03-17 ENCOUNTER — Encounter: Payer: Medicare HMO | Admitting: Speech Pathology

## 2019-03-17 ENCOUNTER — Ambulatory Visit: Payer: Medicare HMO

## 2019-03-19 ENCOUNTER — Ambulatory Visit: Payer: Medicare HMO | Admitting: Physical Therapy

## 2019-03-19 ENCOUNTER — Encounter: Payer: Medicare HMO | Admitting: Occupational Therapy

## 2019-03-19 ENCOUNTER — Encounter: Payer: Medicare HMO | Admitting: Speech Pathology

## 2019-03-19 DIAGNOSIS — J441 Chronic obstructive pulmonary disease with (acute) exacerbation: Secondary | ICD-10-CM | POA: Diagnosis not present

## 2019-03-19 DIAGNOSIS — E1169 Type 2 diabetes mellitus with other specified complication: Secondary | ICD-10-CM | POA: Diagnosis not present

## 2019-03-19 DIAGNOSIS — I639 Cerebral infarction, unspecified: Secondary | ICD-10-CM | POA: Diagnosis not present

## 2019-03-19 DIAGNOSIS — I129 Hypertensive chronic kidney disease with stage 1 through stage 4 chronic kidney disease, or unspecified chronic kidney disease: Secondary | ICD-10-CM | POA: Diagnosis not present

## 2019-03-19 DIAGNOSIS — E785 Hyperlipidemia, unspecified: Secondary | ICD-10-CM | POA: Diagnosis not present

## 2019-03-19 DIAGNOSIS — R69 Illness, unspecified: Secondary | ICD-10-CM | POA: Diagnosis not present

## 2019-03-19 DIAGNOSIS — I10 Essential (primary) hypertension: Secondary | ICD-10-CM | POA: Diagnosis not present

## 2019-03-20 DIAGNOSIS — Q283 Other malformations of cerebral vessels: Secondary | ICD-10-CM | POA: Diagnosis not present

## 2019-03-24 ENCOUNTER — Ambulatory Visit: Payer: Medicare HMO | Admitting: Physical Therapy

## 2019-03-24 ENCOUNTER — Encounter: Payer: Medicare HMO | Admitting: Occupational Therapy

## 2019-03-24 ENCOUNTER — Encounter: Payer: Medicare HMO | Admitting: Speech Pathology

## 2019-03-26 ENCOUNTER — Encounter: Payer: Medicare HMO | Admitting: Occupational Therapy

## 2019-03-26 ENCOUNTER — Ambulatory Visit: Payer: Medicare HMO | Admitting: Speech Pathology

## 2019-03-26 ENCOUNTER — Ambulatory Visit: Payer: Medicare HMO | Admitting: Physical Therapy

## 2019-05-16 DIAGNOSIS — G4700 Insomnia, unspecified: Secondary | ICD-10-CM | POA: Diagnosis not present

## 2019-05-16 DIAGNOSIS — I10 Essential (primary) hypertension: Secondary | ICD-10-CM | POA: Diagnosis not present

## 2019-05-16 DIAGNOSIS — N183 Chronic kidney disease, stage 3 unspecified: Secondary | ICD-10-CM | POA: Diagnosis not present

## 2019-05-16 DIAGNOSIS — E785 Hyperlipidemia, unspecified: Secondary | ICD-10-CM | POA: Diagnosis not present

## 2019-05-16 DIAGNOSIS — I639 Cerebral infarction, unspecified: Secondary | ICD-10-CM | POA: Diagnosis not present

## 2019-05-16 DIAGNOSIS — J441 Chronic obstructive pulmonary disease with (acute) exacerbation: Secondary | ICD-10-CM | POA: Diagnosis not present

## 2019-05-16 DIAGNOSIS — E1169 Type 2 diabetes mellitus with other specified complication: Secondary | ICD-10-CM | POA: Diagnosis not present

## 2019-05-16 DIAGNOSIS — I129 Hypertensive chronic kidney disease with stage 1 through stage 4 chronic kidney disease, or unspecified chronic kidney disease: Secondary | ICD-10-CM | POA: Diagnosis not present

## 2019-05-16 DIAGNOSIS — R69 Illness, unspecified: Secondary | ICD-10-CM | POA: Diagnosis not present

## 2019-08-07 DIAGNOSIS — E1169 Type 2 diabetes mellitus with other specified complication: Secondary | ICD-10-CM | POA: Diagnosis not present

## 2019-08-07 DIAGNOSIS — I739 Peripheral vascular disease, unspecified: Secondary | ICD-10-CM | POA: Diagnosis not present

## 2019-08-07 DIAGNOSIS — R69 Illness, unspecified: Secondary | ICD-10-CM | POA: Diagnosis not present

## 2019-08-07 DIAGNOSIS — N183 Chronic kidney disease, stage 3 unspecified: Secondary | ICD-10-CM | POA: Diagnosis not present

## 2019-08-07 DIAGNOSIS — E785 Hyperlipidemia, unspecified: Secondary | ICD-10-CM | POA: Diagnosis not present

## 2019-08-07 DIAGNOSIS — G720 Drug-induced myopathy: Secondary | ICD-10-CM | POA: Diagnosis not present

## 2019-08-07 DIAGNOSIS — I69320 Aphasia following cerebral infarction: Secondary | ICD-10-CM | POA: Diagnosis not present

## 2019-08-07 DIAGNOSIS — K219 Gastro-esophageal reflux disease without esophagitis: Secondary | ICD-10-CM | POA: Diagnosis not present

## 2019-08-07 DIAGNOSIS — I129 Hypertensive chronic kidney disease with stage 1 through stage 4 chronic kidney disease, or unspecified chronic kidney disease: Secondary | ICD-10-CM | POA: Diagnosis not present

## 2019-08-07 DIAGNOSIS — I69351 Hemiplegia and hemiparesis following cerebral infarction affecting right dominant side: Secondary | ICD-10-CM | POA: Diagnosis not present

## 2019-11-15 IMAGING — DX PORTABLE CHEST - 1 VIEW
1 series · 2 of 2 positions shown · non-contrast
Comparison: 02/01/2018.

CLINICAL DATA: Ventilator dependent. Status post PEG placement.

EXAM:
PORTABLE CHEST 1 VIEW

[Series 1: chest · 0.14mm/px · 2 of 2 slices shown]
[im 1/2]
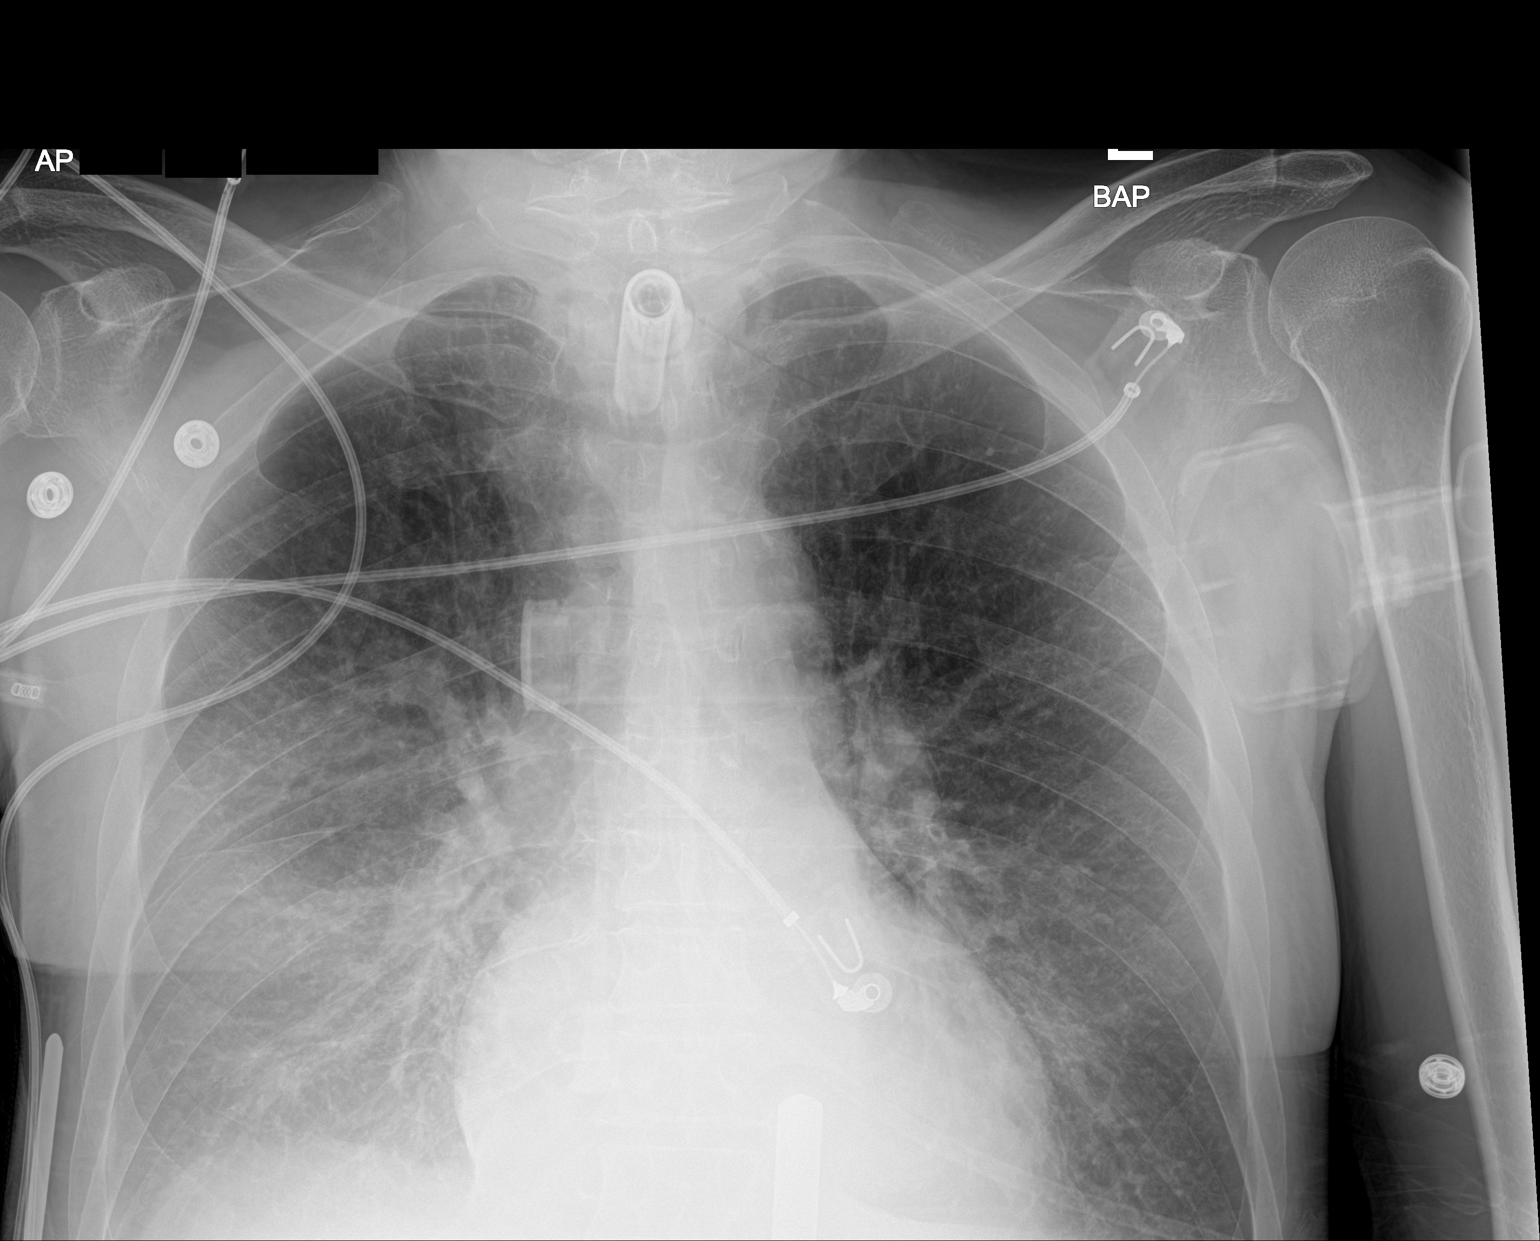
[im 2/2]
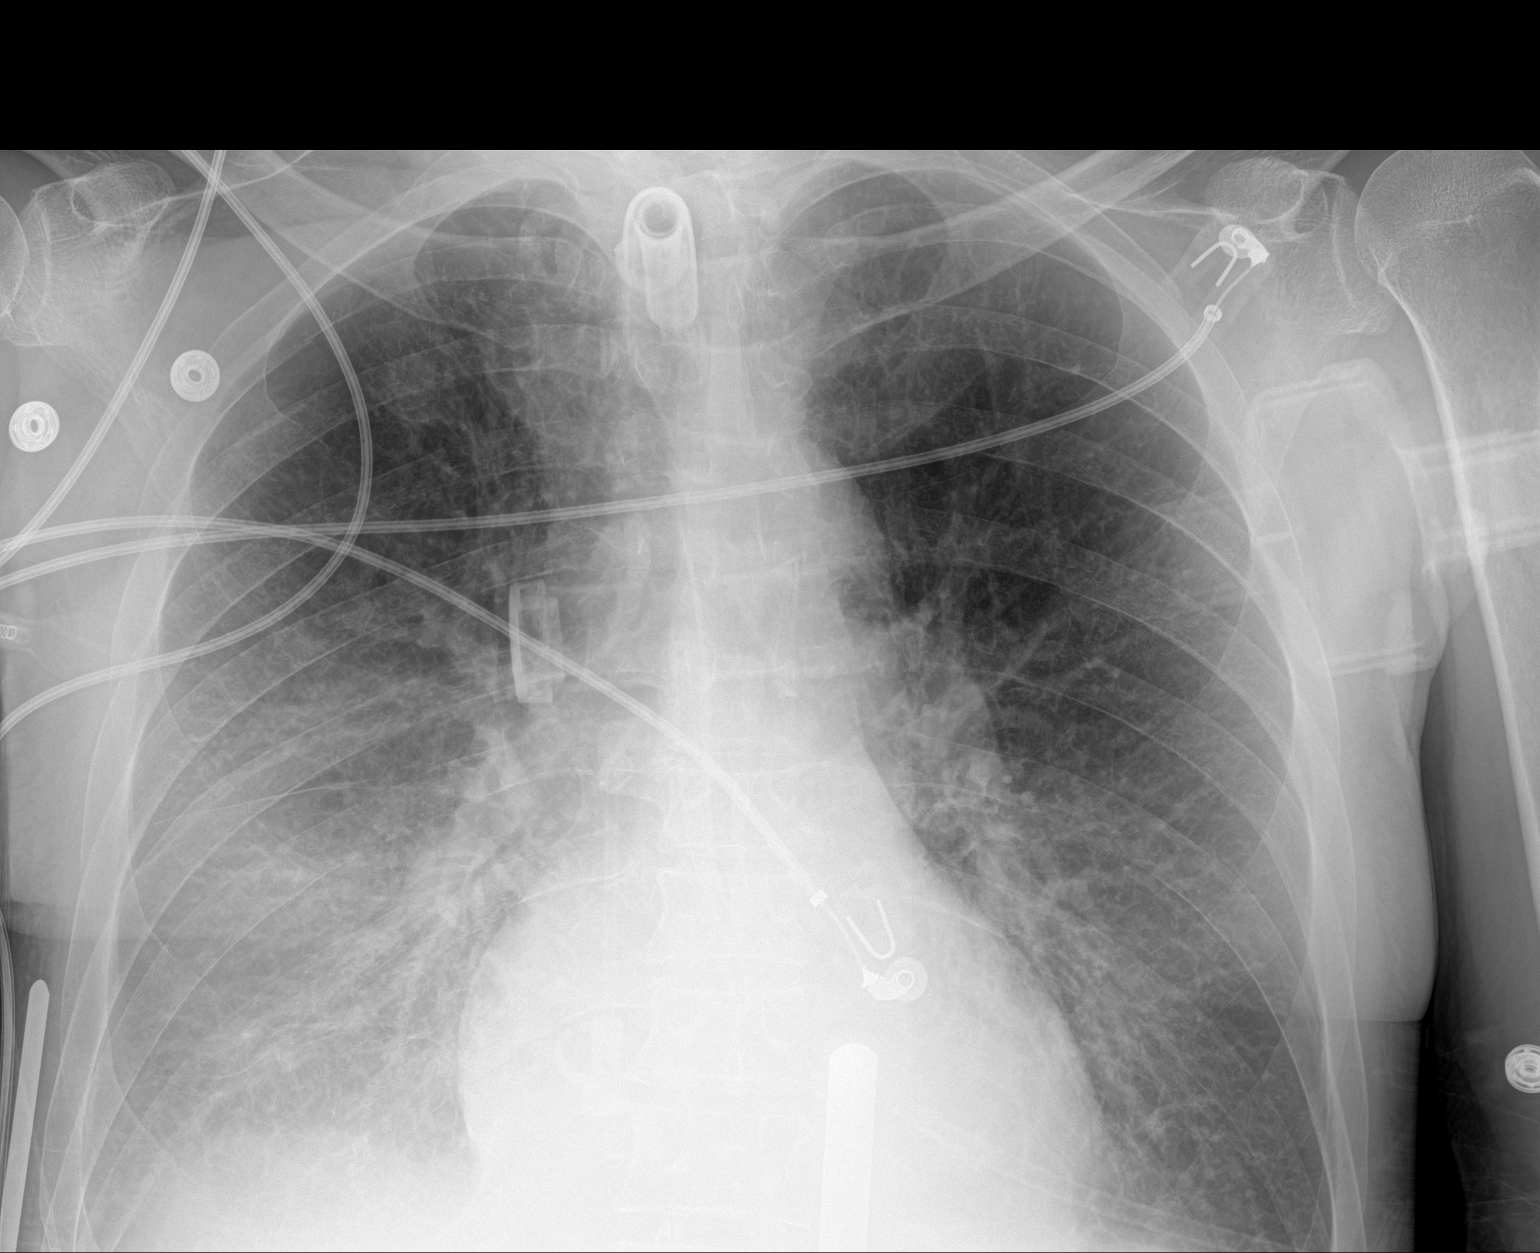

[2 of 2 positions shown; findings below may reference images not displayed]

FINDINGS: Tracheostomy good position. Cardiomegaly. Increased perihilar
markings, could represent infiltrates or edema. These appear worse
on the RIGHT but this could be due to overlying soft tissue.
IMPRESSION: Increased perihilar markings, could represent infiltrates or edema.
Continued surveillance is warranted. Worsening aeration from priors.

## 2019-11-19 DIAGNOSIS — I739 Peripheral vascular disease, unspecified: Secondary | ICD-10-CM | POA: Diagnosis not present

## 2019-11-19 DIAGNOSIS — I639 Cerebral infarction, unspecified: Secondary | ICD-10-CM | POA: Diagnosis not present

## 2019-11-19 DIAGNOSIS — R69 Illness, unspecified: Secondary | ICD-10-CM | POA: Diagnosis not present

## 2019-11-19 DIAGNOSIS — I129 Hypertensive chronic kidney disease with stage 1 through stage 4 chronic kidney disease, or unspecified chronic kidney disease: Secondary | ICD-10-CM | POA: Diagnosis not present

## 2019-11-19 DIAGNOSIS — R7309 Other abnormal glucose: Secondary | ICD-10-CM | POA: Diagnosis not present

## 2019-11-19 DIAGNOSIS — N1832 Chronic kidney disease, stage 3b: Secondary | ICD-10-CM | POA: Diagnosis not present

## 2019-11-19 DIAGNOSIS — D631 Anemia in chronic kidney disease: Secondary | ICD-10-CM | POA: Diagnosis not present

## 2019-11-19 DIAGNOSIS — I429 Cardiomyopathy, unspecified: Secondary | ICD-10-CM | POA: Diagnosis not present

## 2019-11-19 DIAGNOSIS — E785 Hyperlipidemia, unspecified: Secondary | ICD-10-CM | POA: Diagnosis not present

## 2019-11-19 DIAGNOSIS — K589 Irritable bowel syndrome without diarrhea: Secondary | ICD-10-CM | POA: Diagnosis not present

## 2019-11-21 IMAGING — DX PORTABLE CHEST - 1 VIEW
1 series · 1 of 1 positions shown · non-contrast
Comparison: Radiograph 08/02/2010

CLINICAL DATA: S/P reverse total shoulder arthroplasty, left

EXAM:
PORTABLE CHEST 1 VIEW

[chest ap]
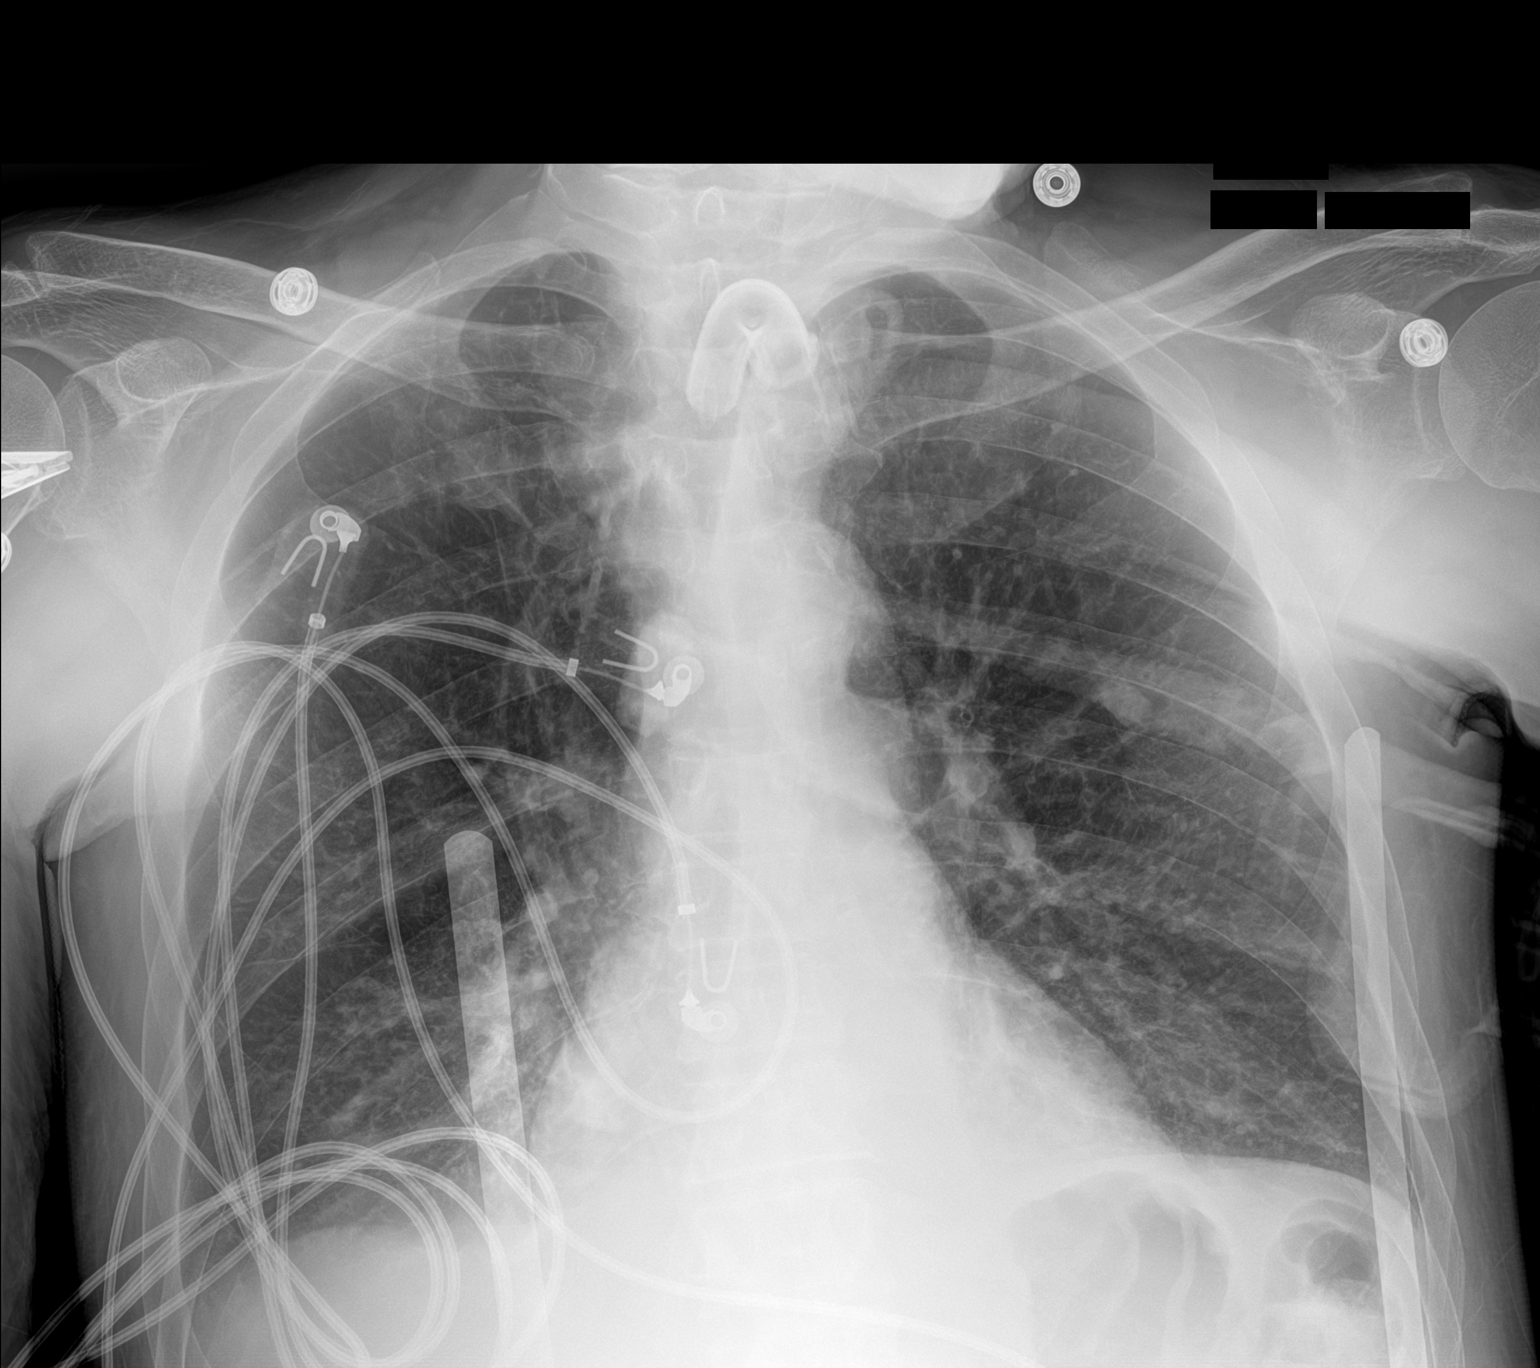

[1 of 1 positions shown; findings below may reference images not displayed]

FINDINGS: Tracheostomy tube in good position. Normal cardiac silhouette. Lungs
are clear. No infiltrate or edema. No pneumothorax.
IMPRESSION: No acute cardiopulmonary process.

## 2019-12-02 ENCOUNTER — Other Ambulatory Visit: Payer: Self-pay | Admitting: Internal Medicine

## 2019-12-02 DIAGNOSIS — N189 Chronic kidney disease, unspecified: Secondary | ICD-10-CM

## 2019-12-02 DIAGNOSIS — D631 Anemia in chronic kidney disease: Secondary | ICD-10-CM

## 2019-12-02 DIAGNOSIS — I129 Hypertensive chronic kidney disease with stage 1 through stage 4 chronic kidney disease, or unspecified chronic kidney disease: Secondary | ICD-10-CM

## 2019-12-02 DIAGNOSIS — N1832 Chronic kidney disease, stage 3b: Secondary | ICD-10-CM

## 2019-12-10 ENCOUNTER — Other Ambulatory Visit: Payer: Medicare HMO

## 2019-12-11 ENCOUNTER — Ambulatory Visit
Admission: RE | Admit: 2019-12-11 | Discharge: 2019-12-11 | Disposition: A | Discharge status: Home / Self Care | Payer: Medicare HMO | Source: Ambulatory Visit | Attending: Internal Medicine | Admitting: Internal Medicine

## 2019-12-11 DIAGNOSIS — I129 Hypertensive chronic kidney disease with stage 1 through stage 4 chronic kidney disease, or unspecified chronic kidney disease: Secondary | ICD-10-CM

## 2019-12-11 DIAGNOSIS — N189 Chronic kidney disease, unspecified: Secondary | ICD-10-CM

## 2019-12-11 DIAGNOSIS — N281 Cyst of kidney, acquired: Secondary | ICD-10-CM | POA: Diagnosis not present

## 2019-12-11 DIAGNOSIS — D631 Anemia in chronic kidney disease: Secondary | ICD-10-CM

## 2019-12-11 DIAGNOSIS — N1832 Chronic kidney disease, stage 3b: Secondary | ICD-10-CM

## 2020-05-26 DIAGNOSIS — G720 Drug-induced myopathy: Secondary | ICD-10-CM | POA: Diagnosis not present

## 2020-05-26 DIAGNOSIS — I69351 Hemiplegia and hemiparesis following cerebral infarction affecting right dominant side: Secondary | ICD-10-CM | POA: Diagnosis not present

## 2020-05-26 DIAGNOSIS — I739 Peripheral vascular disease, unspecified: Secondary | ICD-10-CM | POA: Diagnosis not present

## 2020-05-26 DIAGNOSIS — R69 Illness, unspecified: Secondary | ICD-10-CM | POA: Diagnosis not present

## 2020-05-26 DIAGNOSIS — E1169 Type 2 diabetes mellitus with other specified complication: Secondary | ICD-10-CM | POA: Diagnosis not present

## 2020-05-26 DIAGNOSIS — E785 Hyperlipidemia, unspecified: Secondary | ICD-10-CM | POA: Diagnosis not present

## 2020-05-26 DIAGNOSIS — I129 Hypertensive chronic kidney disease with stage 1 through stage 4 chronic kidney disease, or unspecified chronic kidney disease: Secondary | ICD-10-CM | POA: Diagnosis not present

## 2020-05-26 DIAGNOSIS — K219 Gastro-esophageal reflux disease without esophagitis: Secondary | ICD-10-CM | POA: Diagnosis not present

## 2020-05-26 DIAGNOSIS — I69320 Aphasia following cerebral infarction: Secondary | ICD-10-CM | POA: Diagnosis not present

## 2020-05-26 DIAGNOSIS — N183 Chronic kidney disease, stage 3 unspecified: Secondary | ICD-10-CM | POA: Diagnosis not present

## 2021-03-02 DIAGNOSIS — K219 Gastro-esophageal reflux disease without esophagitis: Secondary | ICD-10-CM | POA: Diagnosis not present

## 2021-03-02 DIAGNOSIS — I129 Hypertensive chronic kidney disease with stage 1 through stage 4 chronic kidney disease, or unspecified chronic kidney disease: Secondary | ICD-10-CM | POA: Diagnosis not present

## 2021-03-02 DIAGNOSIS — I69320 Aphasia following cerebral infarction: Secondary | ICD-10-CM | POA: Diagnosis not present

## 2021-03-02 DIAGNOSIS — E1169 Type 2 diabetes mellitus with other specified complication: Secondary | ICD-10-CM | POA: Diagnosis not present

## 2021-03-02 DIAGNOSIS — G720 Drug-induced myopathy: Secondary | ICD-10-CM | POA: Diagnosis not present

## 2021-03-02 DIAGNOSIS — E785 Hyperlipidemia, unspecified: Secondary | ICD-10-CM | POA: Diagnosis not present

## 2021-03-02 DIAGNOSIS — R69 Illness, unspecified: Secondary | ICD-10-CM | POA: Diagnosis not present

## 2021-03-02 DIAGNOSIS — N183 Chronic kidney disease, stage 3 unspecified: Secondary | ICD-10-CM | POA: Diagnosis not present

## 2021-03-02 DIAGNOSIS — I69351 Hemiplegia and hemiparesis following cerebral infarction affecting right dominant side: Secondary | ICD-10-CM | POA: Diagnosis not present

## 2021-03-02 DIAGNOSIS — I739 Peripheral vascular disease, unspecified: Secondary | ICD-10-CM | POA: Diagnosis not present

## 2021-03-15 DIAGNOSIS — H5213 Myopia, bilateral: Secondary | ICD-10-CM | POA: Diagnosis not present

## 2021-03-15 DIAGNOSIS — Z0100 Encounter for examination of eyes and vision without abnormal findings: Secondary | ICD-10-CM | POA: Diagnosis not present

## 2021-03-25 IMAGING — US US RENAL
1 series · 13 of 25 positions shown · non-contrast
Comparison: CT angiogram abdomen and pelvis March 11, 2018

CLINICAL DATA: Chronic kidney disease

EXAM:
RENAL / URINARY TRACT ULTRASOUND COMPLETE

[Series 1: us renal · 0.23mm/px · 13 of 56 slices shown]
[im 1/56]
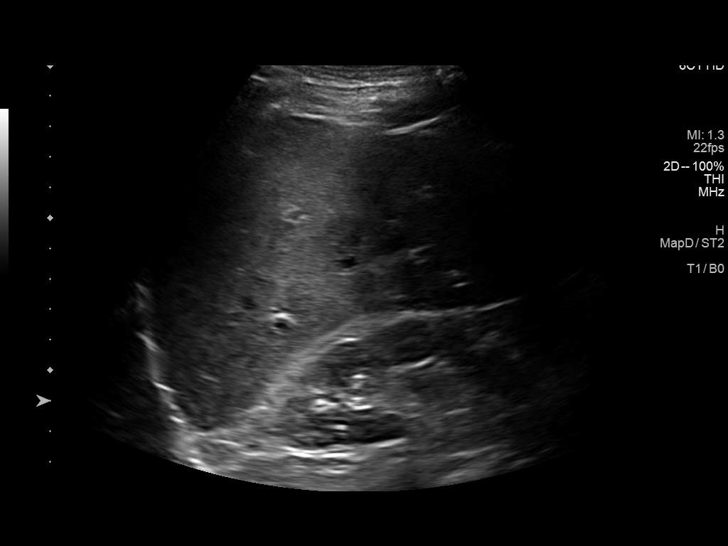
[im 5/56]
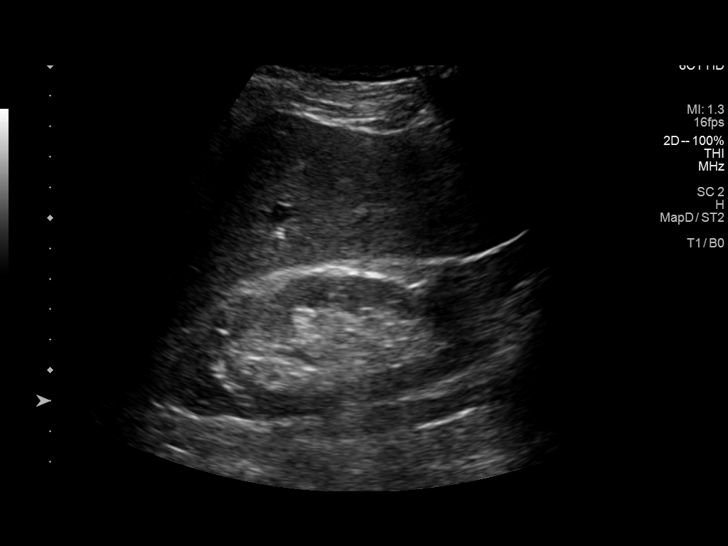
[im 10/56]
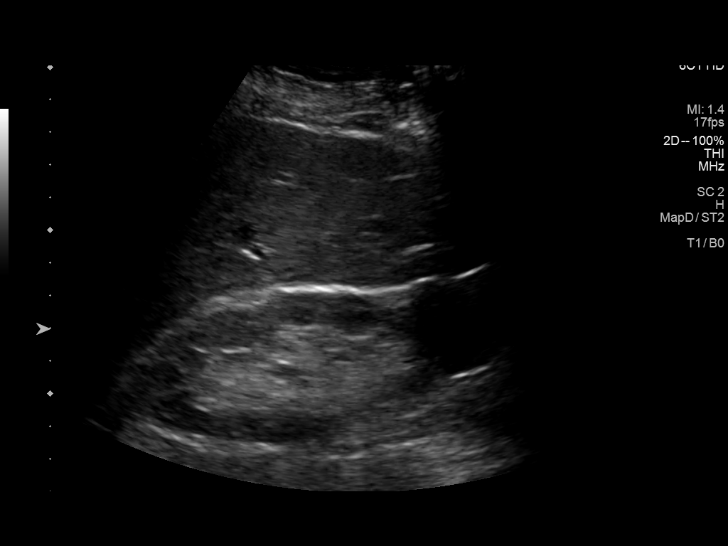
[im 14/56]
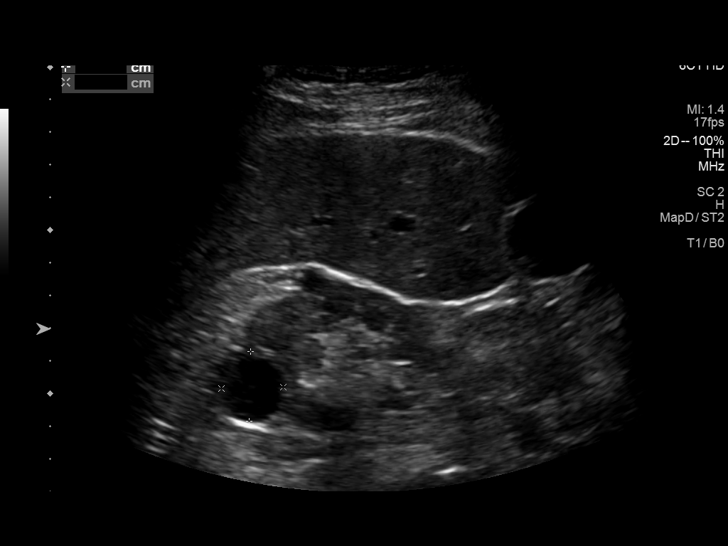
[im 19/56]
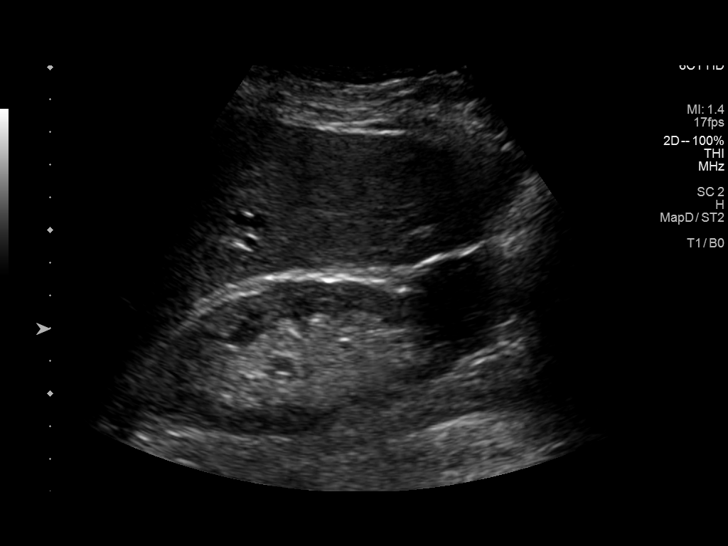
[im 23/56]
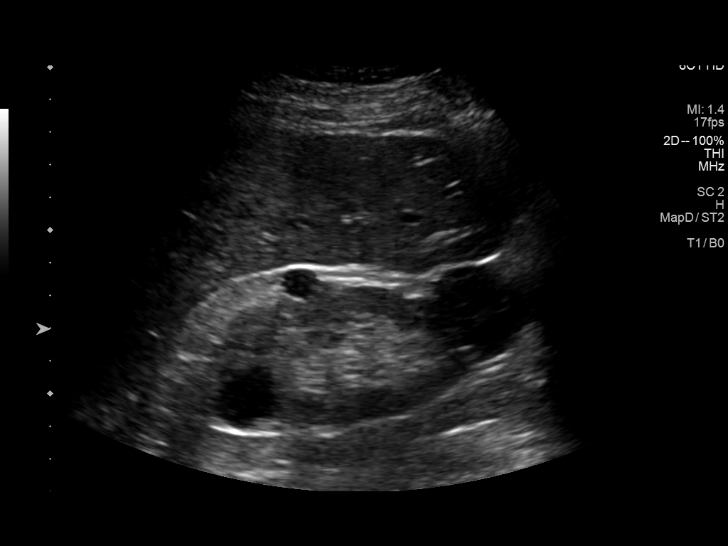
[im 28/56]
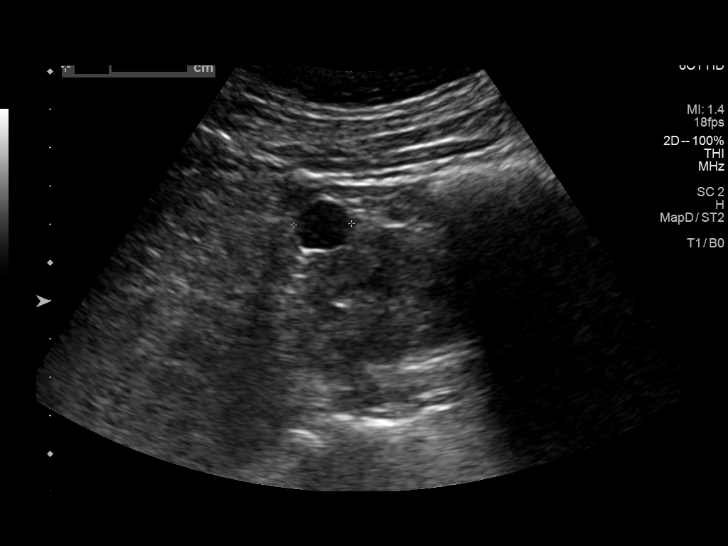
[im 33/56]
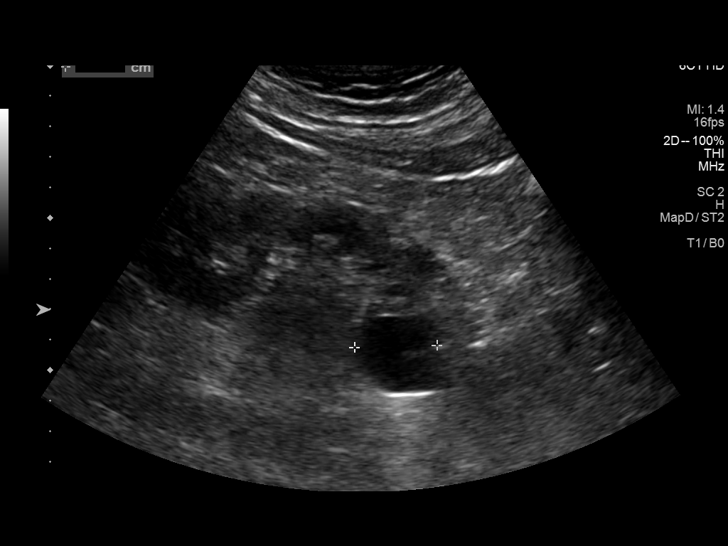
[im 37/56]
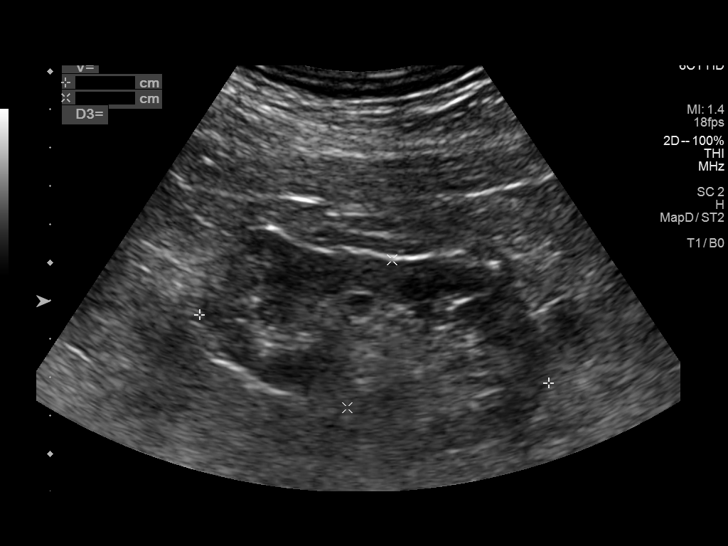
[im 42/56]
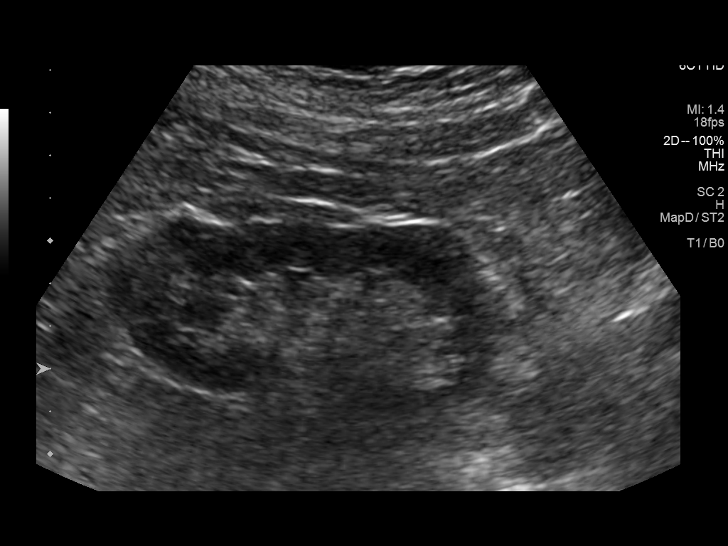
[im 46/56]
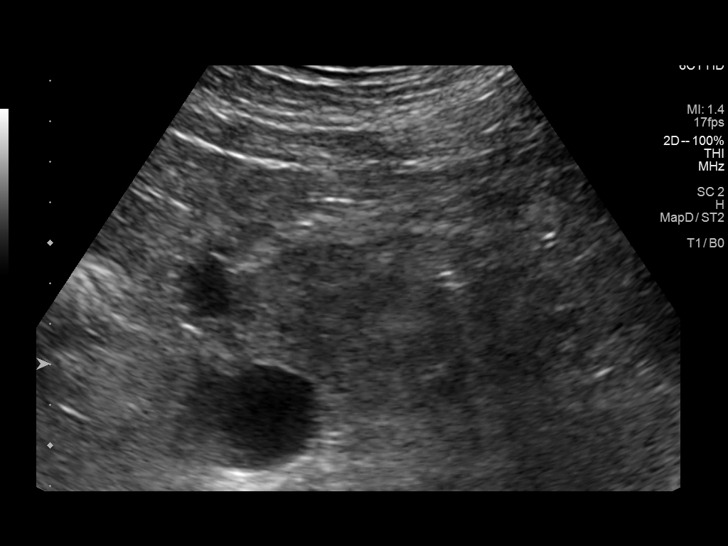
[im 51/56]
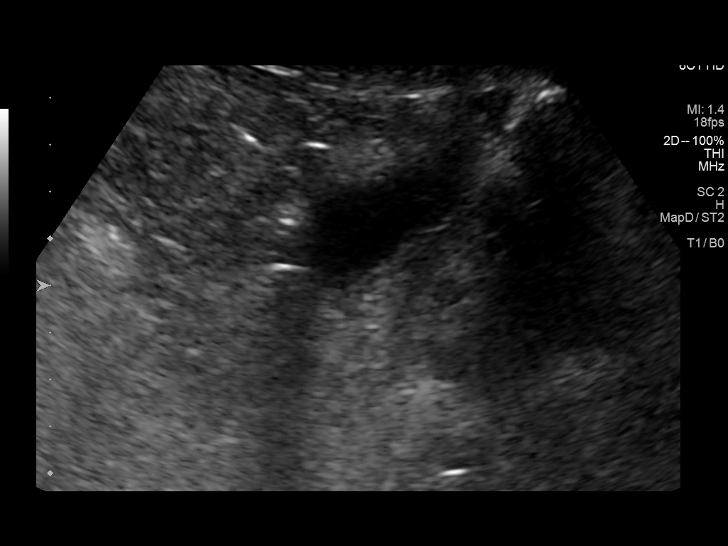
[im 56/56]
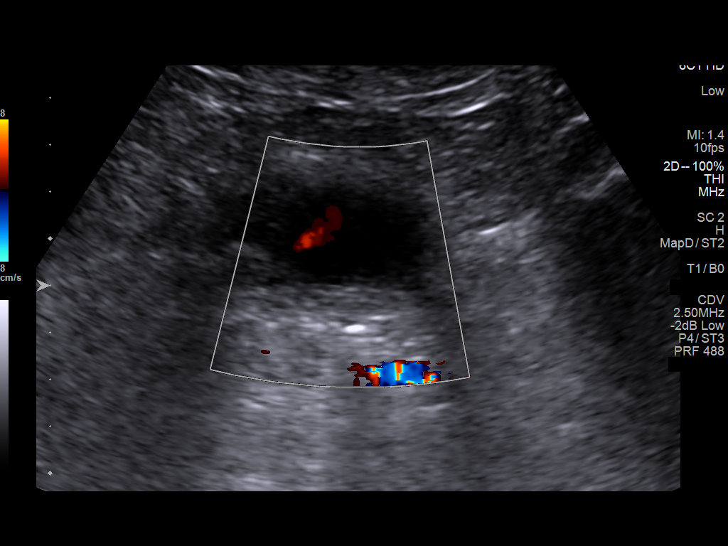

[13 of 25 positions shown; findings below may reference images not displayed]

FINDINGS: Right Kidney:

Renal measurements: 10.2 x 3.6 x 4.6 cm = volume: 87 mL.
Echogenicity and renal cortical thickness are within normal limits.
No perinephric fluid or hydronephrosis visualized. There is a cyst
arising from the lower pole the right kidney measuring 3.1 x 2.9 x
2.9 cm. There is a cyst in the upper pole region of the right kidney
measuring 2.7 x 2.1 x 1.9 cm. A cyst in the mid kidney on the right
measures 1.1 x 1.1 x 1.1 cm. No sonographically demonstrable
calculus or ureterectasis.

Left Kidney:

Renal measurements: 9.3 x 4.0 x 4.0 cm = volume: 79 mL. Echogenicity
and renal cortical thickness are within normal limits. No
perinephric fluid or hydronephrosis visualized. There is a cyst
arising from the lower pole left kidney measuring 2.7 x 2.4 x
cm. A second lower pole renal cyst measures 1.6 x 1.6 x 1.4 cm. A
cyst in the upper pole of the left kidney measures 1.0 x 0.9 x
cm. There is also a cyst in the upper pole the left kidney measuring
0.7 x 0.6 x 0.6 cm. No sonographically demonstrable calculus or
ureterectasis.

Bladder:

Appears normal for degree of bladder distention.

Other:

Prostate contains several calcifications. Prostate measures 4.4 x
3.8 x 5.8 cm. The prostate abuts the inferior bladder.
IMPRESSION: Renal cysts bilaterally. No obstructing focus in either kidney.
Renal cortical thickness and echogenicity appear within normal
limits bilaterally.

Prominent prostate containing several calcifications. This finding
may warrant clinical assessment and PSA evaluation.

## 2021-11-02 DIAGNOSIS — R69 Illness, unspecified: Secondary | ICD-10-CM | POA: Diagnosis not present

## 2021-11-02 DIAGNOSIS — E1169 Type 2 diabetes mellitus with other specified complication: Secondary | ICD-10-CM | POA: Diagnosis not present

## 2021-11-02 DIAGNOSIS — E785 Hyperlipidemia, unspecified: Secondary | ICD-10-CM | POA: Diagnosis not present

## 2021-11-02 DIAGNOSIS — I129 Hypertensive chronic kidney disease with stage 1 through stage 4 chronic kidney disease, or unspecified chronic kidney disease: Secondary | ICD-10-CM | POA: Diagnosis not present

## 2021-11-02 DIAGNOSIS — K219 Gastro-esophageal reflux disease without esophagitis: Secondary | ICD-10-CM | POA: Diagnosis not present

## 2021-11-02 DIAGNOSIS — N183 Chronic kidney disease, stage 3 unspecified: Secondary | ICD-10-CM | POA: Diagnosis not present

## 2021-11-02 DIAGNOSIS — I69351 Hemiplegia and hemiparesis following cerebral infarction affecting right dominant side: Secondary | ICD-10-CM | POA: Diagnosis not present

## 2021-11-02 DIAGNOSIS — I739 Peripheral vascular disease, unspecified: Secondary | ICD-10-CM | POA: Diagnosis not present

## 2021-11-02 DIAGNOSIS — Z1211 Encounter for screening for malignant neoplasm of colon: Secondary | ICD-10-CM | POA: Diagnosis not present

## 2022-06-28 DIAGNOSIS — I69351 Hemiplegia and hemiparesis following cerebral infarction affecting right dominant side: Secondary | ICD-10-CM | POA: Diagnosis not present

## 2022-06-28 DIAGNOSIS — E1122 Type 2 diabetes mellitus with diabetic chronic kidney disease: Secondary | ICD-10-CM | POA: Diagnosis not present

## 2022-06-28 DIAGNOSIS — E785 Hyperlipidemia, unspecified: Secondary | ICD-10-CM | POA: Diagnosis not present

## 2022-06-28 DIAGNOSIS — S41112A Laceration without foreign body of left upper arm, initial encounter: Secondary | ICD-10-CM | POA: Diagnosis not present

## 2022-06-28 DIAGNOSIS — E1151 Type 2 diabetes mellitus with diabetic peripheral angiopathy without gangrene: Secondary | ICD-10-CM | POA: Diagnosis not present

## 2022-06-28 DIAGNOSIS — I69320 Aphasia following cerebral infarction: Secondary | ICD-10-CM | POA: Diagnosis not present

## 2022-06-28 DIAGNOSIS — N183 Chronic kidney disease, stage 3 unspecified: Secondary | ICD-10-CM | POA: Diagnosis not present

## 2022-06-28 DIAGNOSIS — F3341 Major depressive disorder, recurrent, in partial remission: Secondary | ICD-10-CM | POA: Diagnosis not present

## 2022-06-28 DIAGNOSIS — K219 Gastro-esophageal reflux disease without esophagitis: Secondary | ICD-10-CM | POA: Diagnosis not present

## 2022-06-28 DIAGNOSIS — G720 Drug-induced myopathy: Secondary | ICD-10-CM | POA: Diagnosis not present

## 2022-06-28 DIAGNOSIS — I129 Hypertensive chronic kidney disease with stage 1 through stage 4 chronic kidney disease, or unspecified chronic kidney disease: Secondary | ICD-10-CM | POA: Diagnosis not present

## 2022-06-28 DIAGNOSIS — E1169 Type 2 diabetes mellitus with other specified complication: Secondary | ICD-10-CM | POA: Diagnosis not present

## 2023-11-07 DIAGNOSIS — N183 Chronic kidney disease, stage 3 unspecified: Secondary | ICD-10-CM | POA: Diagnosis not present

## 2024-04-03 NOTE — Progress Notes (Signed)
 Mark [name]                                          MRN: [id number]   04/03/2024   The [facility] Quality Team Specialist reviewed this patient medical record for the purposes of chart review for care gap closure. The following were reviewed: chart review for care gap closure-diabetic eye exam.    [facility] Quality Team
